# Patient Record
Sex: Female | Born: 1965 | Race: Black or African American | Hispanic: No | Marital: Single | Smoking: Current every day smoker
Health system: Southern US, Community
[De-identification: ages and names within clinical notes are randomized; demographics above are authoritative.]

## PROBLEM LIST (undated history)

## (undated) DIAGNOSIS — E66813 Obesity, class 3: Secondary | ICD-10-CM

## (undated) DIAGNOSIS — G4730 Sleep apnea, unspecified: Secondary | ICD-10-CM

## (undated) DIAGNOSIS — E119 Type 2 diabetes mellitus without complications: Secondary | ICD-10-CM

## (undated) DIAGNOSIS — I10 Essential (primary) hypertension: Secondary | ICD-10-CM

## (undated) DIAGNOSIS — K219 Gastro-esophageal reflux disease without esophagitis: Secondary | ICD-10-CM

## (undated) HISTORY — PX: OTHER SURGICAL HISTORY: SHX169

## (undated) HISTORY — DX: Obesity, class 3: E66.813

## (undated) HISTORY — DX: Morbid (severe) obesity due to excess calories: E66.01

---

## 1997-04-16 HISTORY — PX: TUBAL LIGATION: SHX77

## 1997-06-13 ENCOUNTER — Ambulatory Visit (HOSPITAL_COMMUNITY): Admission: RE | Admit: 1997-06-13 | Discharge: 1997-06-13 | Payer: Self-pay | Admitting: Obstetrics

## 1997-08-25 ENCOUNTER — Ambulatory Visit (HOSPITAL_COMMUNITY): Admission: RE | Admit: 1997-08-25 | Discharge: 1997-08-25 | Payer: Self-pay | Admitting: Obstetrics

## 1997-08-25 ENCOUNTER — Other Ambulatory Visit: Admission: RE | Admit: 1997-08-25 | Discharge: 1997-08-25 | Payer: Self-pay | Admitting: Obstetrics

## 1997-09-27 ENCOUNTER — Inpatient Hospital Stay (HOSPITAL_COMMUNITY): Admission: AD | Admit: 1997-09-27 | Discharge: 1997-09-29 | Payer: Self-pay | Admitting: Obstetrics

## 1997-10-28 ENCOUNTER — Ambulatory Visit (HOSPITAL_COMMUNITY): Admission: RE | Admit: 1997-10-28 | Discharge: 1997-10-28 | Payer: Self-pay | Admitting: Obstetrics

## 2000-12-11 ENCOUNTER — Emergency Department (HOSPITAL_COMMUNITY): Admission: EM | Admit: 2000-12-11 | Discharge: 2000-12-11 | Payer: Self-pay | Admitting: Emergency Medicine

## 2000-12-19 ENCOUNTER — Encounter: Admission: RE | Admit: 2000-12-19 | Discharge: 2000-12-19 | Payer: Self-pay | Admitting: Obstetrics

## 2002-03-14 ENCOUNTER — Emergency Department (HOSPITAL_COMMUNITY): Admission: EM | Admit: 2002-03-14 | Discharge: 2002-03-14 | Payer: Self-pay | Admitting: Emergency Medicine

## 2002-04-03 ENCOUNTER — Ambulatory Visit (HOSPITAL_BASED_OUTPATIENT_CLINIC_OR_DEPARTMENT_OTHER): Admission: RE | Admit: 2002-04-03 | Discharge: 2002-04-03 | Payer: Self-pay | Admitting: General Surgery

## 2003-04-11 ENCOUNTER — Emergency Department (HOSPITAL_COMMUNITY): Admission: EM | Admit: 2003-04-11 | Discharge: 2003-04-11 | Payer: Self-pay | Admitting: Emergency Medicine

## 2003-09-12 ENCOUNTER — Emergency Department (HOSPITAL_COMMUNITY): Admission: EM | Admit: 2003-09-12 | Discharge: 2003-09-12 | Payer: Self-pay | Admitting: Emergency Medicine

## 2003-11-04 ENCOUNTER — Ambulatory Visit (HOSPITAL_COMMUNITY): Admission: RE | Admit: 2003-11-04 | Discharge: 2003-11-04 | Payer: Self-pay | Admitting: Obstetrics

## 2005-08-22 ENCOUNTER — Emergency Department (HOSPITAL_COMMUNITY): Admission: EM | Admit: 2005-08-22 | Discharge: 2005-08-22 | Payer: Self-pay | Admitting: *Deleted

## 2006-11-18 ENCOUNTER — Emergency Department (HOSPITAL_COMMUNITY): Admission: EM | Admit: 2006-11-18 | Discharge: 2006-11-18 | Payer: Self-pay | Admitting: Emergency Medicine

## 2008-02-07 ENCOUNTER — Emergency Department (HOSPITAL_COMMUNITY): Admission: EM | Admit: 2008-02-07 | Discharge: 2008-02-07 | Payer: Self-pay | Admitting: Emergency Medicine

## 2009-06-02 ENCOUNTER — Emergency Department (HOSPITAL_COMMUNITY): Admission: EM | Admit: 2009-06-02 | Discharge: 2009-06-02 | Payer: Self-pay | Admitting: Emergency Medicine

## 2010-08-12 ENCOUNTER — Emergency Department (HOSPITAL_COMMUNITY): Payer: Medicaid Other

## 2010-08-12 ENCOUNTER — Emergency Department (HOSPITAL_COMMUNITY)
Admission: EM | Admit: 2010-08-12 | Discharge: 2010-08-12 | Disposition: A | Discharge status: Home / Self Care | Payer: Medicaid Other | Attending: Emergency Medicine | Admitting: Emergency Medicine

## 2010-08-12 DIAGNOSIS — F17200 Nicotine dependence, unspecified, uncomplicated: Secondary | ICD-10-CM | POA: Insufficient documentation

## 2010-08-12 DIAGNOSIS — M79609 Pain in unspecified limb: Secondary | ICD-10-CM

## 2010-08-12 DIAGNOSIS — M7989 Other specified soft tissue disorders: Secondary | ICD-10-CM | POA: Insufficient documentation

## 2010-08-12 DIAGNOSIS — I10 Essential (primary) hypertension: Secondary | ICD-10-CM | POA: Insufficient documentation

## 2010-08-15 ENCOUNTER — Emergency Department (HOSPITAL_COMMUNITY)
Admission: EM | Admit: 2010-08-15 | Discharge: 2010-08-15 | Disposition: A | Discharge status: Home / Self Care | Payer: Self-pay | Attending: Emergency Medicine | Admitting: Emergency Medicine

## 2010-08-15 DIAGNOSIS — F17200 Nicotine dependence, unspecified, uncomplicated: Secondary | ICD-10-CM | POA: Insufficient documentation

## 2010-08-15 DIAGNOSIS — M79609 Pain in unspecified limb: Secondary | ICD-10-CM | POA: Insufficient documentation

## 2010-08-15 DIAGNOSIS — I10 Essential (primary) hypertension: Secondary | ICD-10-CM | POA: Insufficient documentation

## 2010-09-01 NOTE — Op Note (Signed)
   Melissa Valdez, Melissa Valdez                         ACCOUNT NO.:  [id number]   MEDICAL RECORD NO.:  [id number]                   PATIENT TYPE:  AMB   LOCATION:  DSC                                  FACILITY:  [facility]   PHYSICIAN:  Ollen Gross. Vernell Morgans, M.D.              DATE OF BIRTH:  [birth year]   DATE OF PROCEDURE:  04/03/2002  DATE OF DISCHARGE:  04/03/2002                                 OPERATIVE REPORT   PREOPERATIVE DIAGNOSIS:  A 1 cm mass of the left forearm and a 3 cm mass of  the right upper inner thigh.   POSTOPERATIVE DIAGNOSIS:  A 1 cm mass of the left forearm and a 3 cm mass of  the right upper inner thigh.   OPERATION:  Excision of mass from the left arm and right thigh.   SURGEON:  Ollen Gross. Carolynne Edouard, M.D.   ANESTHESIA:  Local and IV sedation.   DESCRIPTION OF PROCEDURE:  After informed consent was obtained, the patient  was brought to the operating room and placed in the supine position on the  operating table.  After adequate sedation was achieved, the patient's left  arm and right upper thigh were prepped with Betadine and draped in the usual  sterile manner.  Attention was initially focused on the left arm.  This area  was infiltrated with 0.25% Marcaine with epinephrine.  A small elliptical  incision was made around the mass in question on the arm and excised sharply  with a 15 blade knife. The incision was then closed with a running 4-0  Monocryl subcuticular stitch.  Benzoin, Steri-Strips and sterile dressings  were applied.  Next, attention was turned to the right thigh.  Again the  base of this mass, which appeared to be fairly pedunculated, was infiltrated  with 0.25% Marcaine with epinephrine.  The base of the mass was excised in  an elliptical fashion down to the subcutaneous fat without difficulty.  Hemostasis was then achieved using Bovie electrocautery.  The incision was  closed using an interrupted 4-0 Monocryl subcuticular stitches.  Dermabond  dressing was  then applied and sterile dressings were applied when the  Dermabond was dry.  The patient tolerated the procedure well.  At the end of  the case, all needle, sponge and instrument counts were correct.  The  patient was then taken to the recovery room in stable condition.  Both  masses were sent to pathology for further evaluation.                                               Ollen Gross. Vernell Morgans, M.D.    PST/MEDQ  D:  04/07/2002  T:  04/07/2002  Job:  [id number]

## 2011-01-29 LAB — WET PREP, GENITAL
Trich, Wet Prep: NONE SEEN
WBC, Wet Prep HPF POC: NONE SEEN
Yeast Wet Prep HPF POC: NONE SEEN

## 2011-01-29 LAB — URINALYSIS, ROUTINE W REFLEX MICROSCOPIC
Bilirubin Urine: NEGATIVE
Glucose, UA: NEGATIVE
Protein, ur: NEGATIVE
Urobilinogen, UA: 0.2

## 2011-01-29 LAB — RPR: RPR Ser Ql: NONREACTIVE

## 2011-01-29 LAB — GC/CHLAMYDIA PROBE AMP, GENITAL: Chlamydia, DNA Probe: NEGATIVE

## 2011-01-29 LAB — URINE MICROSCOPIC-ADD ON

## 2011-04-26 ENCOUNTER — Emergency Department (HOSPITAL_COMMUNITY)
Admission: EM | Admit: 2011-04-26 | Discharge: 2011-04-26 | Disposition: A | Discharge status: Home / Self Care | Payer: Self-pay | Attending: Emergency Medicine | Admitting: Emergency Medicine

## 2011-04-26 ENCOUNTER — Encounter (HOSPITAL_COMMUNITY): Payer: Self-pay

## 2011-04-26 DIAGNOSIS — J069 Acute upper respiratory infection, unspecified: Secondary | ICD-10-CM | POA: Insufficient documentation

## 2011-04-26 DIAGNOSIS — R220 Localized swelling, mass and lump, head: Secondary | ICD-10-CM | POA: Insufficient documentation

## 2011-04-26 DIAGNOSIS — R059 Cough, unspecified: Secondary | ICD-10-CM | POA: Insufficient documentation

## 2011-04-26 DIAGNOSIS — F17200 Nicotine dependence, unspecified, uncomplicated: Secondary | ICD-10-CM | POA: Insufficient documentation

## 2011-04-26 DIAGNOSIS — J3489 Other specified disorders of nose and nasal sinuses: Secondary | ICD-10-CM | POA: Insufficient documentation

## 2011-04-26 DIAGNOSIS — R05 Cough: Secondary | ICD-10-CM | POA: Insufficient documentation

## 2011-04-26 DIAGNOSIS — R070 Pain in throat: Secondary | ICD-10-CM | POA: Insufficient documentation

## 2011-04-26 HISTORY — DX: Essential (primary) hypertension: I10

## 2011-04-26 LAB — RAPID STREP SCREEN (MED CTR MEBANE ONLY): Streptococcus, Group A Screen (Direct): NEGATIVE

## 2011-04-26 MED ORDER — ALBUTEROL SULFATE HFA 108 (90 BASE) MCG/ACT IN AERS
2.0000 | INHALATION_SPRAY | RESPIRATORY_TRACT | Status: DC | PRN
  Administered 2011-04-26: 2 via RESPIRATORY_TRACT

## 2011-04-26 MED ORDER — ALBUTEROL SULFATE HFA 108 (90 BASE) MCG/ACT IN AERS
INHALATION_SPRAY | RESPIRATORY_TRACT | Status: AC
  Filled 2011-04-26: qty 6.7

## 2011-04-26 MED ORDER — AEROCHAMBER PLUS W/MASK MISC
Status: AC
  Filled 2011-04-26: qty 1

## 2011-04-26 MED ORDER — AEROCHAMBER PLUS W/MASK MISC
1.0000 | Freq: Once | Status: AC
  Administered 2011-04-26: 1

## 2011-04-26 NOTE — ED Provider Notes (Signed)
History     CSN: [id number]  Arrival date & time 04/26/11  4098   First MD Initiated Contact with Patient 04/26/11 1027      Chief Complaint  Patient presents with  . Sore Throat  . Cough  . URI    (Consider location/radiation/quality/duration/timing/severity/associated sxs/prior treatment) HPI Comments: Patient with two-day history of upper respiratory infection symptoms including ear fullness, sore throat, nasal congestion, runny nose, and cough. She denies fever, chills, myalgias, nausea, vomiting, or diarrhea. She's been taking ibuprofen at home for pain. No sick contacts.  Patient is a 46 y.o. female presenting with pharyngitis, cough, and URI. The history is provided by the patient and a relative.  Sore Throat This is a new problem. The current episode started yesterday. The problem occurs constantly. Associated symptoms include congestion, coughing and a sore throat. Pertinent negatives include no abdominal pain, anorexia, chest pain, chills, fever, headaches, myalgias, nausea, rash or vomiting. The symptoms are aggravated by swallowing. She has tried NSAIDs for the symptoms. The treatment provided mild relief.  Cough Associated symptoms include rhinorrhea and sore throat. Pertinent negatives include no chest pain, no chills, no headaches, no myalgias and no shortness of breath.  URI The primary symptoms include sore throat and cough. Primary symptoms do not include fever, headaches, abdominal pain, nausea, vomiting, myalgias or rash.  Symptoms associated with the illness include congestion and rhinorrhea. The illness is not associated with chills.    Past Medical History  Diagnosis Date  . Hypertension     History reviewed. No pertinent past surgical history.  History reviewed. No pertinent family history.  History  Substance Use Topics  . Smoking status: Current Everyday Smoker  . Smokeless tobacco: Not on file  . Alcohol Use: Yes    OB History    Grav Para Term  Preterm Abortions TAB SAB Ect Mult Living                  Review of Systems  Constitutional: Negative for fever and chills.  HENT: Positive for congestion, sore throat and rhinorrhea.   Eyes: Negative for discharge.  Respiratory: Positive for cough. Negative for shortness of breath.   Cardiovascular: Negative for chest pain.  Gastrointestinal: Negative for nausea, vomiting, abdominal pain, diarrhea, constipation and anorexia.  Genitourinary: Negative for dysuria.  Musculoskeletal: Negative for myalgias.  Skin: Negative for rash.  Neurological: Negative for headaches.  Psychiatric/Behavioral: Negative for confusion.    Allergies  Review of patient's allergies indicates no known allergies.  Home Medications   Current Outpatient Rx  Name Route Sig Dispense Refill  . HYDROCHLOROTHIAZIDE 12.5 MG PO CAPS Oral Take 12.5 mg by mouth daily.    . IBUPROFEN 200 MG PO TABS Oral Take 400 mg by mouth every 6 (six) hours as needed. As needed for pain.      BP 154/100  Pulse 84  Temp(Src) 98 F (36.7 C) (Oral)  Resp 20  SpO2 98%  LMP 04/25/2011  Physical Exam  Nursing note and vitals reviewed. Constitutional: She is oriented to person, place, and time. She appears well-developed and well-nourished.  HENT:  Head: Normocephalic and atraumatic.  Right Ear: Tympanic membrane, external ear and ear canal normal.  Left Ear: Tympanic membrane, external ear and ear canal normal.  Nose: Mucosal edema and rhinorrhea present.  Mouth/Throat: Uvula is midline and mucous membranes are normal. Posterior oropharyngeal erythema present. No oropharyngeal exudate or tonsillar abscesses.  Eyes: Conjunctivae are normal. Pupils are equal, round, and reactive to light.  Right eye exhibits no discharge. Left eye exhibits no discharge.  Neck: Normal range of motion. Neck supple.  Cardiovascular: Normal rate and regular rhythm.   No murmur heard. Pulmonary/Chest: Effort normal and breath sounds normal. No  respiratory distress. She has no wheezes.  Abdominal: Soft. There is no tenderness. There is no rebound and no guarding.  Musculoskeletal: Normal range of motion.  Neurological: She is alert and oriented to person, place, and time.  Skin: Skin is warm and dry. No rash noted.  Psychiatric: She has a normal mood and affect.    ED Course  Procedures (including critical care time)   Labs Reviewed  RAPID STREP SCREEN   No results found.   1. Viral upper respiratory tract infection     11:13 AM Patient seen and examined. Strep screen sent.  Patient informed of neg strep screen result. Patient counseled on supportive care for viral URI and s/s to return including worsening symptoms, persistent fever, persistent vomiting, or if they have any other concerns.  Urged to see PCP if symptoms persist for more than 3 days. Patient verbalizes understanding and agrees with plan.   Patient counseled on use of albuterol HFA.  Told to use 1-2 puffs q 4 hours as needed for SOB.     MDM  Patient with symptoms consistent with a viral syndrome.  Vitals are stable, no fever.  No signs of dehydration.  Lung exam normal, no signs of pneumonia.  Possible bronchospastic component given pt report of wheezing -- inhaler prescribed. Supportive therapy indicated with return if symptoms worsen.           Eustace Moore [place], [place] 04/26/11 1739

## 2011-04-26 NOTE — ED Notes (Signed)
Pt states that for the past couple of days she has been having a sore throat with congestion, cough, and uri s/s. Alert and oriented.

## 2011-04-27 NOTE — ED Provider Notes (Signed)
Medical screening examination/treatment/procedure(s) were performed by non-physician practitioner and as supervising physician I was immediately available for consultation/collaboration.  Brealyn Baril R. Declynn Lopresti, MD 04/27/11 1204 

## 2012-06-25 ENCOUNTER — Other Ambulatory Visit: Payer: Self-pay | Admitting: Internal Medicine

## 2012-06-26 ENCOUNTER — Other Ambulatory Visit: Payer: Self-pay | Admitting: Internal Medicine

## 2012-07-08 ENCOUNTER — Ambulatory Visit
Admission: RE | Admit: 2012-07-08 | Discharge: 2012-07-08 | Disposition: A | Discharge status: Home / Self Care | Payer: Medicaid Other | Source: Ambulatory Visit | Attending: Internal Medicine | Admitting: Internal Medicine

## 2012-07-08 DIAGNOSIS — N6310 Unspecified lump in the right breast, unspecified quadrant: Secondary | ICD-10-CM

## 2012-07-09 ENCOUNTER — Ambulatory Visit
Admission: RE | Admit: 2012-07-09 | Discharge: 2012-07-09 | Disposition: A | Discharge status: Home / Self Care | Payer: Medicaid Other | Source: Ambulatory Visit | Attending: Internal Medicine | Admitting: Internal Medicine

## 2012-07-09 DIAGNOSIS — N920 Excessive and frequent menstruation with regular cycle: Secondary | ICD-10-CM

## 2012-08-28 ENCOUNTER — Encounter (HOSPITAL_COMMUNITY): Payer: Self-pay | Admitting: Emergency Medicine

## 2012-08-28 ENCOUNTER — Emergency Department (HOSPITAL_COMMUNITY)
Admission: EM | Admit: 2012-08-28 | Discharge: 2012-08-28 | Disposition: A | Discharge status: Home / Self Care | Payer: Medicaid Other | Source: Home / Self Care | Attending: Emergency Medicine | Admitting: Emergency Medicine

## 2012-08-28 ENCOUNTER — Other Ambulatory Visit (HOSPITAL_COMMUNITY)
Admission: RE | Admit: 2012-08-28 | Discharge: 2012-08-28 | Disposition: A | Discharge status: Home / Self Care | Payer: Medicaid Other | Source: Ambulatory Visit | Attending: Emergency Medicine | Admitting: Emergency Medicine

## 2012-08-28 DIAGNOSIS — N760 Acute vaginitis: Secondary | ICD-10-CM | POA: Insufficient documentation

## 2012-08-28 DIAGNOSIS — Z113 Encounter for screening for infections with a predominantly sexual mode of transmission: Secondary | ICD-10-CM | POA: Insufficient documentation

## 2012-08-28 DIAGNOSIS — N390 Urinary tract infection, site not specified: Secondary | ICD-10-CM

## 2012-08-28 LAB — POCT URINALYSIS DIP (DEVICE)
Bilirubin Urine: NEGATIVE
Ketones, ur: NEGATIVE mg/dL
Protein, ur: 30 mg/dL — AB
Specific Gravity, Urine: >=1.03 (ref 1.005–1.030)
pH: 6 (ref 5.0–8.0)

## 2012-08-28 LAB — POCT PREGNANCY, URINE: Preg Test, Ur: NEGATIVE

## 2012-08-28 MED ORDER — CEPHALEXIN 500 MG PO CAPS: 500.0000 mg | ORAL_CAPSULE | Freq: Three times a day (TID) | ORAL | Status: DC

## 2012-08-28 MED ORDER — FLUCONAZOLE 150 MG PO TABS: 150.0000 mg | ORAL_TABLET | Freq: Once | ORAL | Status: DC

## 2012-08-28 MED ORDER — PHENAZOPYRIDINE HCL 200 MG PO TABS: 200.0000 mg | ORAL_TABLET | Freq: Three times a day (TID) | ORAL | Status: DC | PRN

## 2012-08-28 MED ORDER — METRONIDAZOLE 500 MG PO TABS: 500.0000 mg | ORAL_TABLET | Freq: Two times a day (BID) | ORAL | Status: DC

## 2012-08-28 NOTE — ED Provider Notes (Signed)
Chief Complaint:   Chief Complaint  Patient presents with  . Vaginal Pain    History of Present Illness:   Melissa Valdez is a 47 year old female who has had a two-day history of vaginal irritation and pain, stinging with urination, and slight left flank pain. She denies any vaginal odor or itching. She's had no fever, chills, nausea, vomiting, urinary frequency, urgency, or hematuria. She has a history of a yeast infection the past but no other GYN problems. Last menstrual period was May 7 through the 11th. She is sexually active and has a bilateral tubal ligation for birth control.  Review of Systems:  Other than noted above, the patient denies any of the following symptoms: Systemic:  No fever, chills, sweats, or weight loss. GI:  No abdominal pain, nausea, anorexia, vomiting, diarrhea, constipation, melena or hematochezia. GU:  No dysuria, frequency, urgency, hematuria, vaginal discharge, itching, or abnormal vaginal bleeding. Skin:  No rash or itching.  PMFSH:  Past medical history, family history, social history, meds, and allergies were reviewed.  She has hypertension and GERD and takes hydrochlorothiazide 25 mg a day, omeprazole, and vitamin D.  Physical Exam:   Vital signs:  BP 135/74  Pulse 80  Temp(Src) 97.9 F (36.6 C) (Oral)  Resp 20  SpO2 97%  LMP 08/20/2012 General:  Alert, oriented and in no distress. Lungs:  Breath sounds clear and equal bilaterally.  No wheezes, rales or rhonchi. Heart:  Regular rhythm.  No gallops or murmers. Abdomen:  Soft, flat and non-distended.  No organomegaly or mass.  No tenderness, guarding or rebound.  Bowel sounds normally active. Pelvic exam:  Normal external genitalia, no ulcerations. There is a moderate amount of malodorous, tan, frothy discharge. Cervix was otherwise normal. No pain on cervical motion. Uterus was normal in size and shape and nontender. No adnexal masses or tenderness. Skin:  Clear, warm and dry.  Labs:   Results for  orders placed during the hospital encounter of 08/28/12  POCT URINALYSIS DIP (DEVICE)      Result Value Range   Glucose, UA NEGATIVE  NEGATIVE mg/dL   Bilirubin Urine NEGATIVE  NEGATIVE   Ketones, ur NEGATIVE  NEGATIVE mg/dL   Specific Gravity, Urine >=1.030  1.005 - 1.030   Hgb urine dipstick SMALL (*) NEGATIVE   pH 6.0  5.0 - 8.0   Protein, ur 30 (*) NEGATIVE mg/dL   Urobilinogen, UA 0.2  0.0 - 1.0 mg/dL   Nitrite POSITIVE (*) NEGATIVE   Leukocytes, UA MODERATE (*) NEGATIVE  POCT PREGNANCY, URINE      Result Value Range   Preg Test, Ur NEGATIVE  NEGATIVE    DNA probes for gonorrhea, Chlamydia, Trichomonas, Gardnerella, Candida obtained. Results are pending at this time and we'll call patient back about any positive results. Also obtain urine culture and we'll call patient is a need to change her antibiotics.  Assessment:  The primary encounter diagnosis was Vaginitis. A diagnosis of UTI (lower urinary tract infection) was also pertinent to this visit.  The discharge has the appearance of Trichomonas. Awaiting results of DNA probes, we'll go ahead and treat expectantly. It also appears she has urinary tract infection as well.  Plan:   1.  The following meds were prescribed:   New Prescriptions   CEPHALEXIN (KEFLEX) 500 MG CAPSULE    Take 1 capsule (500 mg total) by mouth 3 (three) times daily.   FLUCONAZOLE (DIFLUCAN) 150 MG TABLET    Take 1 tablet (150 mg total)  by mouth once.   METRONIDAZOLE (FLAGYL) 500 MG TABLET    Take 1 tablet (500 mg total) by mouth 2 (two) times daily.   2.  The patient was instructed in symptomatic care and handouts were given. 3.  The patient was told to return if becoming worse in any way, if no better in 7 days, and given some red flag symptoms such as worsening pain, fever, or persistent vomiting that would indicate earlier return. 4.  Follow up here if no improvement in one week.    Reuben Likes, MD 08/28/12 [phone]

## 2012-08-28 NOTE — ED Notes (Addendum)
C/o vaginal pain onset last pain while taking a shower and soap got up into her vagina.  Hx. Yeast infections.  No discharge or odor or blisters.  C/o stinging with urination and feels like she has to urinate all the time.

## 2012-08-30 LAB — URINE CULTURE: Colony Count: >=100000

## 2012-08-31 NOTE — ED Notes (Signed)
GC/Chlamydia neg., Affirm: Gardnerella pos., Candida and Trich neg., Urine culture: >100,000 colonies E. Coli.  Pt. adequately treated with Flagyl and Keflex. Vassie Moselle 08/31/2012

## 2012-12-01 ENCOUNTER — Other Ambulatory Visit: Payer: Self-pay | Admitting: Internal Medicine

## 2012-12-01 DIAGNOSIS — N6310 Unspecified lump in the right breast, unspecified quadrant: Secondary | ICD-10-CM

## 2012-12-16 ENCOUNTER — Ambulatory Visit
Admission: RE | Admit: 2012-12-16 | Discharge: 2012-12-16 | Disposition: A | Discharge status: Home / Self Care | Payer: Medicaid Other | Source: Ambulatory Visit | Attending: Internal Medicine | Admitting: Internal Medicine

## 2012-12-16 DIAGNOSIS — N6310 Unspecified lump in the right breast, unspecified quadrant: Secondary | ICD-10-CM

## 2013-05-12 ENCOUNTER — Encounter (HOSPITAL_COMMUNITY): Payer: Self-pay | Admitting: Emergency Medicine

## 2013-05-12 ENCOUNTER — Emergency Department (INDEPENDENT_AMBULATORY_CARE_PROVIDER_SITE_OTHER)
Admission: EM | Admit: 2013-05-12 | Discharge: 2013-05-12 | Disposition: A | Discharge status: Home / Self Care | Payer: Self-pay | Source: Home / Self Care | Attending: Family Medicine | Admitting: Family Medicine

## 2013-05-12 DIAGNOSIS — H5710 Ocular pain, unspecified eye: Secondary | ICD-10-CM

## 2013-05-12 DIAGNOSIS — H5712 Ocular pain, left eye: Secondary | ICD-10-CM

## 2013-05-12 DIAGNOSIS — H547 Unspecified visual loss: Secondary | ICD-10-CM

## 2013-05-12 MED ORDER — TETRACAINE HCL 0.5 % OP SOLN
OPHTHALMIC | Status: AC
  Filled 2013-05-12: qty 2

## 2013-05-12 NOTE — ED Provider Notes (Signed)
CSN: [id number]     Arrival date & time 05/12/13  0962 History   None    Chief Complaint  Patient presents with  . Eye Pain   (Consider location/radiation/quality/duration/timing/severity/associated sxs/prior Treatment) HPI Comments: 48 year old female presents complaining of an extremely painful, red, tearing left eye since she woke up this morning. She has never had this problem before. She also notes blurry vision in the affected eye. She rates the pain as 10 out of 10. She is not having any other symptoms at this time and denies any URI type symptoms. She does not think she has scratched her eye, and she does not recall any other injury to the eye  Patient is a 48 y.o. female presenting with eye pain.  Eye Pain Pertinent negatives include no chest pain, no abdominal pain and no shortness of breath.    Past Medical History  Diagnosis Date  . Hypertension    Past Surgical History  Procedure Laterality Date  . Tubal ligation  1999   Family History  Problem Relation Age of Onset  . Hypertension Mother   . Diabetes Father    History  Substance Use Topics  . Smoking status: Current Every Day Smoker -- 0.25 packs/day    Types: Cigarettes  . Smokeless tobacco: Not on file  . Alcohol Use: 12.6 oz/week    14 Shots of liquor, 7 Cans of beer per week   OB History   Grav Para Term Preterm Abortions TAB SAB Ect Mult Living                 Review of Systems  Constitutional: Negative for fever and chills.  Eyes: Positive for pain. Negative for visual disturbance.  Respiratory: Negative for cough and shortness of breath.   Cardiovascular: Negative for chest pain, palpitations and leg swelling.  Gastrointestinal: Negative for nausea, vomiting and abdominal pain.  Endocrine: Negative for polydipsia and polyuria.  Genitourinary: Negative for dysuria, urgency and frequency.  Musculoskeletal: Negative for arthralgias and myalgias.  Skin: Negative for rash.  Neurological: Negative for  dizziness, weakness and light-headedness.    Allergies  Review of patient's allergies indicates no known allergies.  Home Medications   Current Outpatient Rx  Name  Route  Sig  Dispense  Refill  . cephALEXin (KEFLEX) 500 MG capsule   Oral   Take 1 capsule (500 mg total) by mouth 3 (three) times daily.   30 capsule   0   . ergocalciferol (VITAMIN D2) 50000 UNITS capsule   Oral   Take 50,000 Units by mouth once a week.         . fluconazole (DIFLUCAN) 150 MG tablet   Oral   Take 1 tablet (150 mg total) by mouth once.   1 tablet   0   . hydrochlorothiazide (MICROZIDE) 12.5 MG capsule   Oral   Take 25 mg by mouth daily.          Marland Kitchen ibuprofen (ADVIL,MOTRIN) 200 MG tablet   Oral   Take 400 mg by mouth every 6 (six) hours as needed. As needed for pain.         . metroNIDAZOLE (FLAGYL) 500 MG tablet   Oral   Take 1 tablet (500 mg total) by mouth 2 (two) times daily.   14 tablet   0   . omeprazole (PRILOSEC) 20 MG capsule   Oral   Take 20 mg by mouth daily.         . phenazopyridine (PYRIDIUM) 200  MG tablet   Oral   Take 1 tablet (200 mg total) by mouth 3 (three) times daily as needed for pain.   15 tablet   0    BP 164/109  Pulse 77  Temp(Src) 97.6 F (36.4 C) (Oral)  Resp 20  SpO2 95%  LMP 04/30/2013 Physical Exam  Nursing note and vitals reviewed. Constitutional: She is oriented to person, place, and time. Vital signs are normal. She appears well-developed and well-nourished. No distress.  HENT:  Head: Normocephalic and atraumatic.  Eyes: EOM are normal. Pupils are equal, round, and reactive to light. Left eye exhibits discharge (watery). Left eye exhibits no exudate. Left conjunctiva is injected.  Fundoscopic exam:      The left eye shows red reflex.  Slit lamp exam:      The left eye shows corneal abrasion and fluorescein uptake (Abrasion versus dendritic pattern in the central left cornea).  Corrected Visual acuity is 20/30 in the right eye,  20/100 in the left eye. Intraocular pressure in the left is 19  Pulmonary/Chest: Effort normal. No respiratory distress.  Neurological: She is alert and oriented to person, place, and time. She has normal strength. Coordination normal.  Skin: Skin is warm and dry. No rash noted. She is not diaphoretic.  Psychiatric: She has a normal mood and affect. Judgment normal.    ED Course  Procedures (including critical care time) Labs Review Labs Reviewed - No data to display Imaging Review No results found.    MDM   1. Left eye pain   2. Decreased vision    Spoke with the on-call ophthalmology office, Dr. Ellie Lunch, they will see this patient now to rule out HSV keratitis  Liam Graham, PA-C 05/12/13 [phone]

## 2013-05-12 NOTE — ED Notes (Signed)
C/o waking with  Soreness/scratchy left eye with mild drainage.  Denies injury.   No  otc meds tried.

## 2013-05-13 NOTE — ED Provider Notes (Signed)
Medical screening examination/treatment/procedure(s) were performed by a resident physician or non-physician practitioner and as the supervising physician I was immediately available for consultation/collaboration.  Lynne Leader, MD    Gregor Hams, MD 05/13/13 [phone]

## 2013-10-26 ENCOUNTER — Other Ambulatory Visit: Payer: Self-pay | Admitting: Internal Medicine

## 2013-10-26 DIAGNOSIS — N649 Disorder of breast, unspecified: Secondary | ICD-10-CM

## 2013-12-17 ENCOUNTER — Ambulatory Visit
Admission: RE | Admit: 2013-12-17 | Discharge: 2013-12-17 | Disposition: A | Discharge status: Home / Self Care | Payer: Medicaid Other | Source: Ambulatory Visit | Attending: Internal Medicine | Admitting: Internal Medicine

## 2013-12-17 ENCOUNTER — Encounter (INDEPENDENT_AMBULATORY_CARE_PROVIDER_SITE_OTHER): Payer: Self-pay

## 2013-12-17 DIAGNOSIS — N649 Disorder of breast, unspecified: Secondary | ICD-10-CM

## 2014-01-19 ENCOUNTER — Encounter: Payer: Self-pay | Admitting: Obstetrics

## 2014-01-19 ENCOUNTER — Ambulatory Visit (INDEPENDENT_AMBULATORY_CARE_PROVIDER_SITE_OTHER): Payer: Medicaid Other | Admitting: Obstetrics

## 2014-01-19 ENCOUNTER — Other Ambulatory Visit: Payer: Self-pay | Admitting: Obstetrics

## 2014-01-19 VITALS — BP 140/98 | HR 76 | Temp 97.4°F | Ht 60.0 in | Wt 228.0 lb

## 2014-01-19 DIAGNOSIS — D251 Intramural leiomyoma of uterus: Secondary | ICD-10-CM

## 2014-01-19 DIAGNOSIS — Z0000 Encounter for general adult medical examination without abnormal findings: Secondary | ICD-10-CM

## 2014-01-19 DIAGNOSIS — N939 Abnormal uterine and vaginal bleeding, unspecified: Secondary | ICD-10-CM

## 2014-01-19 DIAGNOSIS — N946 Dysmenorrhea, unspecified: Secondary | ICD-10-CM

## 2014-01-19 LAB — CBC WITH DIFFERENTIAL/PLATELET
BASOS ABS: 0 10*3/uL (ref 0.0–0.1)
BASOS PCT: 0 % (ref 0–1)
Eosinophils Absolute: 0.1 10*3/uL (ref 0.0–0.7)
Eosinophils Relative: 1 % (ref 0–5)
HCT: 38.9 % (ref 36.0–46.0)
Hemoglobin: 12.5 g/dL (ref 12.0–15.0)
Lymphocytes Relative: 31 % (ref 12–46)
Lymphs Abs: 3.6 10*3/uL (ref 0.7–4.0)
MCH: 25.5 pg — ABNORMAL LOW (ref 26.0–34.0)
MCHC: 32.1 g/dL (ref 30.0–36.0)
MCV: 79.4 fL (ref 78.0–100.0)
Monocytes Absolute: 0.7 10*3/uL (ref 0.1–1.0)
Monocytes Relative: 6 % (ref 3–12)
NEUTROS ABS: 7.1 10*3/uL (ref 1.7–7.7)
NEUTROS PCT: 62 % (ref 43–77)
PLATELETS: 383 10*3/uL (ref 150–400)
RBC: 4.9 MIL/uL (ref 3.87–5.11)
RDW: 14.8 % (ref 11.5–15.5)
WBC: 11.5 10*3/uL — ABNORMAL HIGH (ref 4.0–10.5)

## 2014-01-20 ENCOUNTER — Ambulatory Visit (HOSPITAL_BASED_OUTPATIENT_CLINIC_OR_DEPARTMENT_OTHER): Payer: Medicaid Other | Attending: Internal Medicine | Admitting: Radiology

## 2014-01-20 ENCOUNTER — Encounter: Payer: Self-pay | Admitting: Obstetrics

## 2014-01-20 VITALS — Ht 60.0 in | Wt 230.0 lb

## 2014-01-20 DIAGNOSIS — R0683 Snoring: Secondary | ICD-10-CM | POA: Insufficient documentation

## 2014-01-20 DIAGNOSIS — N946 Dysmenorrhea, unspecified: Secondary | ICD-10-CM | POA: Insufficient documentation

## 2014-01-20 DIAGNOSIS — G4730 Sleep apnea, unspecified: Secondary | ICD-10-CM | POA: Diagnosis present

## 2014-01-20 DIAGNOSIS — N939 Abnormal uterine and vaginal bleeding, unspecified: Secondary | ICD-10-CM | POA: Insufficient documentation

## 2014-01-20 DIAGNOSIS — G4733 Obstructive sleep apnea (adult) (pediatric): Secondary | ICD-10-CM | POA: Insufficient documentation

## 2014-01-20 LAB — COMPREHENSIVE METABOLIC PANEL
ALBUMIN: 4.1 g/dL (ref 3.5–5.2)
ALK PHOS: 77 U/L (ref 39–117)
ALT: 22 U/L (ref 0–35)
AST: 21 U/L (ref 0–37)
BILIRUBIN TOTAL: 0.3 mg/dL (ref 0.2–1.2)
BUN: 13 mg/dL (ref 6–23)
CO2: 26 mEq/L (ref 19–32)
Calcium: 9.1 mg/dL (ref 8.4–10.5)
Chloride: 95 mEq/L — ABNORMAL LOW (ref 96–112)
Creat: 0.63 mg/dL (ref 0.50–1.10)
Glucose, Bld: 205 mg/dL — ABNORMAL HIGH (ref 70–99)
POTASSIUM: 3.5 meq/L (ref 3.5–5.3)
SODIUM: 133 meq/L — AB (ref 135–145)
TOTAL PROTEIN: 6.8 g/dL (ref 6.0–8.3)

## 2014-01-20 LAB — WET PREP BY MOLECULAR PROBE
CANDIDA SPECIES: NEGATIVE
GARDNERELLA VAGINALIS: POSITIVE — AB
TRICHOMONAS VAG: NEGATIVE

## 2014-01-20 LAB — TSH: TSH: 1.055 u[IU]/mL (ref 0.350–4.500)

## 2014-01-20 LAB — HIV ANTIBODY (ROUTINE TESTING W REFLEX): HIV 1&2 Ab, 4th Generation: NONREACTIVE

## 2014-01-20 LAB — PAP IG AND HPV HIGH-RISK: HPV DNA HIGH RISK: NOT DETECTED

## 2014-01-20 NOTE — Progress Notes (Signed)
Subjective:     Melissa Valdez is a 48 y.o. female here for a routine exam.  Current complaints: Heavy and painful periods..    Personal health questionnaire:  Is patient Ashkenazi Jewish, have a family history of breast and/or ovarian cancer: no Is there a family history of uterine cancer diagnosed at age < 84, gastrointestinal cancer, urinary tract cancer, family member who is a Field seismologist syndrome-associated carrier: no Is the patient overweight and hypertensive, family history of diabetes, personal history of gestational diabetes or PCOS: no Is patient over 55, have PCOS,  family history of premature CHD under age 70, diabetes, smoke, have hypertension or peripheral artery disease:  no At any time, has a partner hit, kicked or otherwise hurt or frightened you?: no Over the past 2 weeks, have you felt down, depressed or hopeless?: no Over the past 2 weeks, have you felt little interest or pleasure in doing things?:no   Gynecologic History Patient's last menstrual period was 12/29/2013. Contraception: tubal ligation Last Pap: 2014. Results were: normal Last mammogram: 2015. Results were: normal  Obstetric History OB History  Gravida Para Term Preterm AB SAB TAB Ectopic Multiple Living  2 2 2       2     # Outcome Date GA Lbr Len/2nd Weight Sex Delivery Anes PTL Lv  2 TRM      SVD   Y  1 TRM      SVD   Y      Past Medical History  Diagnosis Date  . Hypertension     Past Surgical History  Procedure Laterality Date  . Tubal ligation  1999    Current outpatient prescriptions:diclofenac (VOLTAREN) 50 MG EC tablet, Take 50 mg by mouth 2 (two) times daily., Disp: , Rfl: ;  hydrochlorothiazide (MICROZIDE) 12.5 MG capsule, Take 25 mg by mouth daily. , Disp: , Rfl: ;  ibuprofen (ADVIL,MOTRIN) 200 MG tablet, Take 400 mg by mouth every 6 (six) hours as needed. As needed for pain., Disp: , Rfl:  metFORMIN (GLUCOPHAGE) 500 MG tablet, Take 500 mg by mouth 2 (two) times daily with a meal., Disp:  , Rfl: ;  omeprazole (PRILOSEC) 20 MG capsule, Take 20 mg by mouth daily., Disp: , Rfl:  No Known Allergies  History  Substance Use Topics  . Smoking status: Former Smoker -- 0.25 packs/day    Types: Cigarettes    Quit date: 12/29/2013  . Smokeless tobacco: Not on file  . Alcohol Use: 8.4 oz/week    7 Shots of liquor, 7 Cans of beer per week    Family History  Problem Relation Age of Onset  . Hypertension Mother   . Diabetes Father       Review of Systems  Constitutional: negative for fatigue and weight loss Respiratory: negative for cough and wheezing Cardiovascular: negative for chest pain, fatigue and palpitations Gastrointestinal: negative for abdominal pain and change in bowel habits Musculoskeletal:negative for myalgias Neurological: negative for gait problems and tremors Behavioral/Psych: negative for abusive relationship, depression Endocrine: negative for temperature intolerance   Genitourinary:negative for abnormal menstrual periods, genital lesions, hot flashes, sexual problems and vaginal discharge Integument/breast: negative for breast lump, breast tenderness, nipple discharge and skin lesion(s)    Objective:       BP 140/98  Pulse 76  Temp(Src) 97.4 F (36.3 C)  Ht 5' (1.524 m)  Wt 228 lb (103.42 kg)  BMI 44.53 kg/m2  LMP 12/29/2013 General:   alert  Skin:   no rash or  abnormalities  Lungs:   clear to auscultation bilaterally  Heart:   regular rate and rhythm, S1, S2 normal, no murmur, click, rub or gallop  Breasts:   normal without suspicious masses, skin or nipple changes or axillary nodes  Abdomen:  normal findings: no organomegaly, soft, non-tender and no hernia  Pelvis:  External genitalia: normal general appearance Urinary system: urethral meatus normal and bladder without fullness, nontender Vaginal: normal without tenderness, induration or masses Cervix: normal appearance Adnexa: normal bimanual exam Uterus: anteverted and non-tender, normal  size   Lab Review Urine pregnancy test Labs reviewed yes Radiologic studies reviewed yes    Assessment:    Healthy female exam.   Uterine Fibroids  AUB  Dysmenorrhea     Plan:   Ultrasound ordered.  Education reviewed: low fat, low cholesterol diet, self breast exams and weight bearing exercise. Follow up in: 2 weeks. Patient requests a hysterectomy.  Will advise patient after ultrasound.   Meds ordered this encounter  Medications  . metFORMIN (GLUCOPHAGE) 500 MG tablet    Sig: Take 500 mg by mouth 2 (two) times daily with a meal.  . diclofenac (VOLTAREN) 50 MG EC tablet    Sig: Take 50 mg by mouth 2 (two) times daily.   Orders Placed This Encounter  Procedures  . WET PREP BY MOLECULAR PROBE  . GC/Chlamydia Probe Amp  . US Pelvis Complete    Standing Status: Future     Number of Occurrences:      Standing Expiration Date: 03/22/2015    Order Specific Question:  Reason for Exam (SYMPTOM  OR DIAGNOSIS REQUIRED)    Answer:  Fibroids    Order Specific Question:  Preferred imaging location?    Answer:  [facility]  . US Transvaginal Non-OB    Standing Status: Future     Number of Occurrences:      Standing Expiration Date: 03/22/2015    Order Specific Question:  Reason for Exam (SYMPTOM  OR DIAGNOSIS REQUIRED)    Answer:  Fibroids    Order Specific Question:  Preferred imaging location?    Answer:  [facility]  . CBC with Differential  . Comprehensive metabolic panel  . TSH  . HIV antibody (with reflex)

## 2014-01-21 ENCOUNTER — Other Ambulatory Visit: Payer: Self-pay | Admitting: Obstetrics

## 2014-01-21 DIAGNOSIS — B9689 Other specified bacterial agents as the cause of diseases classified elsewhere: Secondary | ICD-10-CM

## 2014-01-21 DIAGNOSIS — N760 Acute vaginitis: Principal | ICD-10-CM

## 2014-01-21 LAB — GC/CHLAMYDIA PROBE AMP
CT Probe RNA: NEGATIVE
GC PROBE AMP APTIMA: NEGATIVE

## 2014-01-21 MED ORDER — METRONIDAZOLE 500 MG PO TABS: 500.0000 mg | ORAL_TABLET | Freq: Two times a day (BID) | ORAL | Status: DC

## 2014-01-27 DIAGNOSIS — G4733 Obstructive sleep apnea (adult) (pediatric): Secondary | ICD-10-CM | POA: Diagnosis not present

## 2014-01-27 NOTE — Sleep Study (Signed)
   NAME: Melissa Valdez DATE OF BIRTH:  [birth year] MEDICAL RECORD NUMBER [id number]  LOCATION: [facility]  PHYSICIAN: Dawnyel Leven D  DATE OF STUDY: 01/20/2014  SLEEP STUDY TYPE: Nocturnal Polysomnogram               REFERRING PHYSICIAN: Philis Fendt, MD  INDICATION FOR STUDY: Hypersomnia with sleep apnea  EPWORTH SLEEPINESS SCORE:   12/24 HEIGHT: 5' (152.4 cm)  WEIGHT: 230 lb (104.327 kg)    Body mass index is 44.92 kg/(m^2).  NECK SIZE: 15.5 in.  MEDICATIONS: Charted for review  SLEEP ARCHITECTURE: Split study protocol. During the diagnostic phase, total sleep time 167.5 minutes sleep efficiency 87.2%. Stage I was 1.8%, stage II 79.7%, stage III 11.6%, REM 6.9% of total sleep time. Sleep latency 15 minutes, REM latency 60 minutes, awake after sleep onset 6 minutes, arousal index 30.8, bedtime medication: None  RESPIRATORY DATA: Apnea hypopneas index (AHI) 45.9 per hour. 128 total events scored including 9 obstructive apneas, 1 mixed apnea, 118 hypopneas. All events were associated with nonsupine sleep position. REM AHI 114.8 per hour. CPAP was titrated to 19 CWP with inadequate control, residual AHI 34.3 per hour. She was then transferred to bilevel with inspiratory pressure of 23 and expiratory pressure of 19 CWP, AHI 1.6 per hour. She wore a small fullface mask.  OXYGEN DATA: Loud snoring with oxygen desaturation to a nadir of 62% on room air. With bilevel control, mean oxygen saturation was 92.1% on room air.  CARDIAC DATA: Sinus rhythm with PACs  MOVEMENT/PARASOMNIA: Bruxism was noted. No significant movement disturbance. Bathroom x1.  IMPRESSION/ RECOMMENDATION:   1) Severe obstructive sleep apnea/hypoxia syndrome, AHI 45.9 per hour with nonsupine events. Loud snoring with oxygen desaturation to a nadir of 62% on room air. 2) CPAP titration to 19 CWP did not give adequate control. She was then changed to bilevel with inspiratory pressure 23 CWP and  expiratory pressure 19 CWP, AHI 1.6 per hour. Snoring was prevented and mean oxygen saturation was 92.1% on room air. She wore a small [employer] Simplus  fullface mask with heated humidifier and an EPR of 2  Deneise Lever Diplomate, [employer] adviser of Sleep Medicine  ELECTRONICALLY SIGNED ON:  01/27/2014, 2:32 PM [place] PH: [phone]   FX: [phone] [place]

## 2014-01-29 ENCOUNTER — Ambulatory Visit (HOSPITAL_COMMUNITY)
Admission: RE | Admit: 2014-01-29 | Discharge: 2014-01-29 | Disposition: A | Discharge status: Home / Self Care | Payer: Medicaid Other | Source: Ambulatory Visit | Attending: Obstetrics | Admitting: Obstetrics

## 2014-01-29 DIAGNOSIS — N831 Corpus luteum cyst: Secondary | ICD-10-CM | POA: Diagnosis not present

## 2014-01-29 DIAGNOSIS — N920 Excessive and frequent menstruation with regular cycle: Secondary | ICD-10-CM | POA: Insufficient documentation

## 2014-01-29 DIAGNOSIS — N949 Unspecified condition associated with female genital organs and menstrual cycle: Secondary | ICD-10-CM | POA: Diagnosis not present

## 2014-01-29 DIAGNOSIS — D252 Subserosal leiomyoma of uterus: Secondary | ICD-10-CM | POA: Diagnosis not present

## 2014-01-29 DIAGNOSIS — D251 Intramural leiomyoma of uterus: Secondary | ICD-10-CM

## 2014-02-02 ENCOUNTER — Ambulatory Visit: Payer: Medicaid Other | Admitting: Obstetrics

## 2014-02-04 ENCOUNTER — Encounter: Payer: Self-pay | Admitting: Obstetrics

## 2014-02-04 ENCOUNTER — Ambulatory Visit (INDEPENDENT_AMBULATORY_CARE_PROVIDER_SITE_OTHER): Payer: Medicaid Other | Admitting: Obstetrics

## 2014-02-04 VITALS — BP 125/84 | HR 69 | Temp 97.8°F | Wt 224.0 lb

## 2014-02-04 DIAGNOSIS — D251 Intramural leiomyoma of uterus: Secondary | ICD-10-CM

## 2014-02-04 DIAGNOSIS — N939 Abnormal uterine and vaginal bleeding, unspecified: Secondary | ICD-10-CM

## 2014-02-04 DIAGNOSIS — N946 Dysmenorrhea, unspecified: Secondary | ICD-10-CM

## 2014-02-04 NOTE — Progress Notes (Signed)
Patient presents for ultrasound follow up and scheduling a consultation with Dr. Delsa Sale for a Robotic - assisted total hysterectomy.  A/P:  Symptomatic uterine fibroids.  Ultrasound results discussed and further consultation scheduled.  Baltazar Najjar MD

## 2014-02-08 ENCOUNTER — Other Ambulatory Visit: Payer: Self-pay

## 2014-02-08 ENCOUNTER — Encounter: Payer: Self-pay | Admitting: Obstetrics & Gynecology

## 2014-02-08 ENCOUNTER — Ambulatory Visit (INDEPENDENT_AMBULATORY_CARE_PROVIDER_SITE_OTHER): Payer: Medicaid Other | Admitting: Obstetrics & Gynecology

## 2014-02-08 VITALS — Temp 97.0°F | Ht 60.0 in | Wt 223.0 lb

## 2014-02-08 DIAGNOSIS — E138 Other specified diabetes mellitus with unspecified complications: Secondary | ICD-10-CM

## 2014-02-08 LAB — HEMOGLOBIN A1C
HEMOGLOBIN A1C: 10.3 % — AB (ref <5.7)
Mean Plasma Glucose: 249 mg/dL — ABNORMAL HIGH (ref <117)

## 2014-02-08 NOTE — Progress Notes (Signed)
Patient ID: Shenaya Lebo, female   DOB: 12-16-1965, 48 y.o.   MRN: [id number]  Chief Complaint  Patient presents with  . Advice Only    HPI Evellyn Tuff is a 48 y.o. female.  The patient carries a diagnosis of uterine fibroids with secondary abnormal uterine bleeding.  There is no demonstrable anemia.  HPI  Past Medical History  Diagnosis Date  . Hypertension     Past Surgical History  Procedure Laterality Date  . Tubal ligation  1999    Family History  Problem Relation Age of Onset  . Hypertension Mother   . Diabetes Father     Social History History  Substance Use Topics  . Smoking status: Former Smoker -- 0.25 packs/day    Types: Cigarettes    Quit date: 12/29/2013  . Smokeless tobacco: Not on file  . Alcohol Use: 1.8 oz/week    1 Shots of liquor, 2 Cans of beer per week    No Known Allergies  Current Outpatient Prescriptions  Medication Sig Dispense Refill  . diclofenac (VOLTAREN) 50 MG EC tablet Take 50 mg by mouth 2 (two) times daily.      . hydrochlorothiazide (MICROZIDE) 12.5 MG capsule Take 25 mg by mouth daily.       . metFORMIN (GLUCOPHAGE) 500 MG tablet Take 500 mg by mouth 2 (two) times daily with a meal.      . omeprazole (PRILOSEC) 20 MG capsule Take 20 mg by mouth daily.      Marland Kitchen ibuprofen (ADVIL,MOTRIN) 200 MG tablet Take 400 mg by mouth every 6 (six) hours as needed. As needed for pain.       No current facility-administered medications for this visit.    Review of Systems Review of Systems Constitutional: negative for fatigue and weight loss Respiratory: negative for cough and wheezing Cardiovascular: negative for chest pain, fatigue and palpitations Gastrointestinal: negative for abdominal pain and change in bowel habits Genitourinary: positive for abnormal uterine bleeding Integument/breast: negative for nipple discharge Musculoskeletal:negative for myalgias Neurological: negative for gait problems and tremors Behavioral/Psych: negative  for abusive relationship, depression Endocrine: negative for temperature intolerance     Temperature 97 F (36.1 C), height 5' (1.524 m), weight 101.152 kg (223 lb), last menstrual period 01/26/2014.  Physical Exam Physical Exam General:   alert  Skin:   no rash or abnormalities  Lungs:   clear to auscultation bilaterally  Heart:   regular rate and rhythm, S1, S2 normal, no murmur, click, rub or gallop  Breasts:   normal without suspicious masses, skin or nipple changes or axillary nodes  Abdomen:  normal findings: no organomegaly, soft, non-tender and no hernia  Pelvis:  External genitalia: normal general appearance Urinary system: urethral meatus normal and bladder without fullness, nontender Vaginal: normal without tenderness, induration or masses Cervix: normal appearance Adnexa: limited by body habitus Uterus: limited by body habitus      Data Reviewed U/S  Assessment   Moderately enlarged fibroid uterus with associated abnormal uterine bleeding Obese/OSA H/O AODM    Plan    Orders Placed This Encounter  Procedures  . WET PREP BY MOLECULAR PROBE  . Hemoglobin A1c   She elects to proceed with an attempted TRH/ B/L salpingectomies.  Risks/benefits reviewed--increased risk secondary to obesity.  Alternatives to hysterectomy were reviewed--Mirena IUD, endometrial ablation, [place] Preop clearance Optimize diabetes control  Endometrial Biopsy Procedure Note  Pre-operative Diagnosis: Abnormal uterine bleeding  Post-operative Diagnosis: same  Indications: abnormal uterine bleeding  Procedure Details  The risks (including infection, bleeding, pain, and uterine perforation) and benefits of the procedure were explained to the patient and Written informed consent was obtained.    The patient was placed in the dorsal lithotomy position.  Bimanual exam showed the uterus to be in the anteroflexed position.  A Graves' speculum inserted in the vagina, and the cervix prepped  with povidone iodine.  Endocervical curettage with a Kevorkian curette was not performed.   A sharp tenaculum was applied to the anterior lip of the cervix for stabilization.  A sterile uterine sound was used to sound the uterus to a depth of 9cm.  A Pipelle endometrial aspirator was used to sample the endometrium.  Sample was sent for pathologic examination.  Condition: Stable  Complications: None  Plan:  The patient was advised to call for any fever or for prolonged or severe pain or bleeding. She was advised to use OTC analgesics as needed for mild to moderate pain. She was advised to avoid vaginal intercourse for 48 hours or until the bleeding has completely stopped.         JACKSON-MOORE,Dalicia Kisner A 02/08/2014, 2:42 PM

## 2014-02-09 LAB — WET PREP BY MOLECULAR PROBE
Candida species: NEGATIVE
GARDNERELLA VAGINALIS: POSITIVE — AB
Trichomonas vaginosis: NEGATIVE

## 2014-02-09 NOTE — Patient Instructions (Signed)
Endometrial Biopsy, Care After Refer to this sheet in the next few weeks. These instructions provide you with information on caring for yourself after your procedure. Your health care provider may also give you more specific instructions. Your treatment has been planned according to current medical practices, but problems sometimes occur. Call your health care provider if you have any problems or questions after your procedure. WHAT TO EXPECT AFTER THE PROCEDURE After your procedure, it is typical to have the following:  You may have mild cramping and a small amount of vaginal bleeding for a few days after the procedure. This is normal. HOME CARE INSTRUCTIONS  Only take over-the-counter or prescription medicine as directed by your health care provider.  Do not douche, use tampons, or have sexual intercourse until your health care provider approves.  Follow your health care provider's instructions regarding any activity restrictions, such as strenuous exercise or heavy lifting. SEEK MEDICAL CARE IF:  You have heavy bleeding or bleeding longer than 2 days after the procedure.  You have bad smelling drainage from your vagina.  You have a fever and chills.  Youhave severe lower stomach (abdominal) pain. SEEK IMMEDIATE MEDICAL CARE IF:  You have severe cramps in your stomach or back.  You pass large blood clots.  Your bleeding increases.  You become weak or lightheaded, or you pass out. Document Released: 01/21/2013 Document Reviewed: 01/21/2013 ExitCare Patient Information 2015 [employer]. This information is not intended to replace advice given to you by your health care provider. Make sure you discuss any questions you have with your health care provider.  

## 2014-02-12 ENCOUNTER — Encounter: Payer: Self-pay | Admitting: *Deleted

## 2014-02-15 ENCOUNTER — Encounter: Payer: Self-pay | Admitting: Obstetrics & Gynecology

## 2014-02-16 ENCOUNTER — Other Ambulatory Visit: Payer: Self-pay | Admitting: *Deleted

## 2014-02-16 ENCOUNTER — Telehealth: Payer: Self-pay | Admitting: *Deleted

## 2014-02-16 NOTE — Telephone Encounter (Signed)
Spoke with patient. She would like medication for her cycle that is due around the 12th of this month. Patient has an appointment with her primary tomorrow and will speak to him regarding her A1C and her upcomming surgery. A preop clearance letter was sent to the provider.She is scheduled for her surgery 12/4.

## 2014-02-16 NOTE — Telephone Encounter (Signed)
Attempted to call patient regarding surgery and pre op clearance. Left message to call back.

## 2014-02-16 NOTE — Telephone Encounter (Signed)
-----   Message from Lahoma Crocker, MD sent at 02/09/2014  3:27 PM EDT ----- Call pt--diabetes in poor control.  Needs repeat Hgb A1c week of 11/23.  Encourage f/u w primary care provider to optimize glycemic control.  Can offer medical management of AUB in the meantime.

## 2014-02-19 ENCOUNTER — Other Ambulatory Visit: Payer: Self-pay | Admitting: *Deleted

## 2014-02-19 DIAGNOSIS — B9689 Other specified bacterial agents as the cause of diseases classified elsewhere: Secondary | ICD-10-CM

## 2014-02-19 DIAGNOSIS — N760 Acute vaginitis: Principal | ICD-10-CM

## 2014-02-19 MED ORDER — METRONIDAZOLE 500 MG PO TABS: 500.0000 mg | ORAL_TABLET | Freq: Two times a day (BID) | ORAL | Status: DC

## 2014-02-21 NOTE — Telephone Encounter (Signed)
Can call in rx for Micronor or NorQD

## 2014-02-24 ENCOUNTER — Other Ambulatory Visit: Payer: Self-pay | Admitting: *Deleted

## 2014-02-24 DIAGNOSIS — N939 Abnormal uterine and vaginal bleeding, unspecified: Secondary | ICD-10-CM

## 2014-02-24 MED ORDER — NORETHINDRONE 0.35 MG PO TABS: 1.0000 | ORAL_TABLET | Freq: Every day | ORAL | Status: DC

## 2014-02-24 NOTE — Telephone Encounter (Signed)
Rx sent to pharmacy- patient notified.

## 2014-03-03 NOTE — Patient Instructions (Addendum)
Melissa Valdez  03/03/2014    Your procedure is scheduled on 03/19/2014.               Come thru the [place] entrance and follow the Signs to the [place] at 0845am.    Call this number if you have problems the morning of surgery: [phone]   Remember:   Do not eat food or drink liquids after midnight.   Take these medicines the morning of surgery with A SIP OF WATER: prilosec    Do not wear jewelry, make-up or nail polish.  Do not wear lotions, powders, or perfumes.  deodorant.  Do not shave 48 hours prior to surgery. .  Do not bring valuables to the hospital.  Contacts, dentures or bridgework may not be worn into surgery.  Leave suitcase in the car. After surgery it may be brought to your room.  For patients admitted to the hospital, checkout time is 11:00 AM the day of  discharge.     Please read over the following fact sheets that you were given: , coughing and deep breathing exercises, leg exercises            [facility] - Preparing for Surgery Before surgery, you can play an important role.  Because skin is not sterile, your skin needs to be as free of germs as possible.  You can reduce the number of germs on your skin by washing with CHG (chlorahexidine gluconate) soap before surgery.  CHG is an antiseptic cleaner which kills germs and bonds with the skin to continue killing germs even after washing. Please DO NOT use if you have an allergy to CHG or antibacterial soaps.  If your skin becomes reddened/irritated stop using the CHG and inform your nurse when you arrive at Short Stay. Do not shave (including legs and underarms) for at least 48 hours prior to the first CHG shower.  You may shave your face/neck. Please follow these instructions carefully:  1.  Shower with CHG Soap the night before surgery and the  morning of Surgery.  2.  If you choose to wash your hair, wash your hair first as usual with your  normal  shampoo.  3.  After you shampoo, rinse your hair  and body thoroughly to remove the  shampoo.                           4.  Use CHG as you would any other liquid soap.  You can apply chg directly  to the skin and wash                       Gently with a scrungie or clean washcloth.  5.  Apply the CHG Soap to your body ONLY FROM THE NECK DOWN.   Do not use on face/ open                           Wound or open sores. Avoid contact with eyes, ears mouth and genitals (private parts).                       Wash face,  Genitals (private parts) with your normal soap.             6.  Wash thoroughly, paying special attention to the area where your surgery  will be performed.  7.  Thoroughly rinse your body with warm water from the neck down.  8.  DO NOT shower/wash with your normal soap after using and rinsing off  the CHG Soap.                9.  Pat yourself dry with a clean towel.            10.  Wear clean pajamas.            11.  Place clean sheets on your bed the night of your first shower and do not  sleep with pets. Day of Surgery : Do not apply any lotions/deodorants the morning of surgery.  Please wear clean clothes to the hospital/surgery center.  FAILURE TO FOLLOW THESE INSTRUCTIONS MAY RESULT IN THE CANCELLATION OF YOUR SURGERY PATIENT SIGNATURE_________________________________  NURSE SIGNATURE__________________________________  ________________________________________________________________________  WHAT IS A BLOOD TRANSFUSION? Blood Transfusion Information  A transfusion is the replacement of blood or some of its parts. Blood is made up of multiple cells which provide different functions.  Red blood cells carry oxygen and are used for blood loss replacement.  White blood cells fight against infection.  Platelets control bleeding.  Plasma helps clot blood.  Other blood products are available for specialized needs, such as hemophilia or other clotting disorders. BEFORE THE TRANSFUSION  Who gives blood for transfusions?    Healthy volunteers who are fully evaluated to make sure their blood is safe. This is blood bank blood. Transfusion therapy is the safest it has ever been in the practice of medicine. Before blood is taken from a donor, a complete history is taken to make sure that person has no history of diseases nor engages in risky social behavior (examples are intravenous drug use or sexual activity with multiple partners). The donor's travel history is screened to minimize risk of transmitting infections, such as malaria. The donated blood is tested for signs of infectious diseases, such as HIV and hepatitis. The blood is then tested to be sure it is compatible with you in order to minimize the chance of a transfusion reaction. If you or a relative donates blood, this is often done in anticipation of surgery and is not appropriate for emergency situations. It takes many days to process the donated blood. RISKS AND COMPLICATIONS Although transfusion therapy is very safe and saves many lives, the main dangers of transfusion include:   Getting an infectious disease.  Developing a transfusion reaction. This is an allergic reaction to something in the blood you were given. Every precaution is taken to prevent this. The decision to have a blood transfusion has been considered carefully by your caregiver before blood is given. Blood is not given unless the benefits outweigh the risks. AFTER THE TRANSFUSION  Right after receiving a blood transfusion, you will usually feel much better and more energetic. This is especially true if your red blood cells have gotten low (anemic). The transfusion raises the level of the red blood cells which carry oxygen, and this usually causes an energy increase.  The nurse administering the transfusion will monitor you carefully for complications. HOME CARE INSTRUCTIONS  No special instructions are needed after a transfusion. You may find your energy is better. Speak with your  caregiver about any limitations on activity for underlying diseases you may have. SEEK MEDICAL CARE IF:   Your condition is not improving after your transfusion.  You develop redness or irritation at the intravenous (IV) site. SEEK IMMEDIATE MEDICAL CARE IF:  Any of  the following symptoms occur over the next 12 hours:  Shaking chills.  You have a temperature by mouth above 102 F (38.9 C), not controlled by medicine.  Chest, back, or muscle pain.  People around you feel you are not acting correctly or are confused.  Shortness of breath or difficulty breathing.  Dizziness and fainting.  You get a rash or develop hives.  You have a decrease in urine output.  Your urine turns a dark color or changes to pink, red, or brown. Any of the following symptoms occur over the next 10 days:  You have a temperature by mouth above 102 F (38.9 C), not controlled by medicine.  Shortness of breath.  Weakness after normal activity.  The white part of the eye turns yellow (jaundice).  You have a decrease in the amount of urine or are urinating less often.  Your urine turns a dark color or changes to pink, red, or brown. Document Released: 03/30/2000 Document Revised: 06/25/2011 Document Reviewed: 11/17/2007 [facility] Patient Information 2014 [place], [place].  _______________________________________________________________________

## 2014-03-04 ENCOUNTER — Encounter (HOSPITAL_COMMUNITY)
Admission: RE | Admit: 2014-03-04 | Discharge: 2014-03-04 | Disposition: A | Discharge status: Home / Self Care | Payer: Medicaid Other | Source: Ambulatory Visit | Attending: Obstetrics & Gynecology | Admitting: Obstetrics & Gynecology

## 2014-03-04 ENCOUNTER — Encounter (HOSPITAL_COMMUNITY): Payer: Self-pay

## 2014-03-04 ENCOUNTER — Ambulatory Visit (HOSPITAL_COMMUNITY)
Admission: RE | Admit: 2014-03-04 | Discharge: 2014-03-04 | Disposition: A | Discharge status: Home / Self Care | Payer: Medicaid Other | Source: Ambulatory Visit | Attending: Anesthesiology | Admitting: Anesthesiology

## 2014-03-04 DIAGNOSIS — Z01818 Encounter for other preprocedural examination: Secondary | ICD-10-CM | POA: Diagnosis present

## 2014-03-04 DIAGNOSIS — Z87891 Personal history of nicotine dependence: Secondary | ICD-10-CM | POA: Insufficient documentation

## 2014-03-04 DIAGNOSIS — I10 Essential (primary) hypertension: Secondary | ICD-10-CM | POA: Insufficient documentation

## 2014-03-04 HISTORY — DX: Type 2 diabetes mellitus without complications: E11.9

## 2014-03-04 HISTORY — DX: Gastro-esophageal reflux disease without esophagitis: K21.9

## 2014-03-04 HISTORY — DX: Sleep apnea, unspecified: G47.30

## 2014-03-04 LAB — BASIC METABOLIC PANEL
Anion gap: 11 (ref 5–15)
BUN: 10 mg/dL (ref 6–23)
CALCIUM: 9.5 mg/dL (ref 8.4–10.5)
CHLORIDE: 95 meq/L — AB (ref 96–112)
CO2: 31 meq/L (ref 19–32)
CREATININE: 0.42 mg/dL — AB (ref 0.50–1.10)
GFR calc Af Amer: >90 mL/min (ref >90)
GFR calc non Af Amer: >90 mL/min (ref >90)
Glucose, Bld: 168 mg/dL — ABNORMAL HIGH (ref 70–99)
Potassium: 3.4 mEq/L — ABNORMAL LOW (ref 3.7–5.3)
Sodium: 137 mEq/L (ref 137–147)

## 2014-03-04 LAB — CBC
HEMATOCRIT: 36.7 % (ref 36.0–46.0)
Hemoglobin: 11.5 g/dL — ABNORMAL LOW (ref 12.0–15.0)
MCH: 25.2 pg — AB (ref 26.0–34.0)
MCHC: 31.3 g/dL (ref 30.0–36.0)
MCV: 80.5 fL (ref 78.0–100.0)
PLATELETS: 325 10*3/uL (ref 150–400)
RBC: 4.56 MIL/uL (ref 3.87–5.11)
RDW: 15 % (ref 11.5–15.5)
WBC: 9.2 10*3/uL (ref 4.0–10.5)

## 2014-03-04 LAB — HCG, SERUM, QUALITATIVE: Preg, Serum: NEGATIVE

## 2014-03-05 NOTE — Progress Notes (Signed)
Glucose is slightly elevated.  Otherwise OK

## 2014-03-08 ENCOUNTER — Other Ambulatory Visit: Payer: Medicaid Other

## 2014-03-08 DIAGNOSIS — Z01818 Encounter for other preprocedural examination: Secondary | ICD-10-CM

## 2014-03-08 LAB — HEMOGLOBIN A1C
Hgb A1c MFr Bld: 9.5 % — ABNORMAL HIGH (ref <5.7)
Mean Plasma Glucose: 226 mg/dL — ABNORMAL HIGH (ref <117)

## 2014-03-10 ENCOUNTER — Other Ambulatory Visit: Payer: Self-pay | Admitting: *Deleted

## 2014-03-10 ENCOUNTER — Encounter (HOSPITAL_COMMUNITY): Payer: Self-pay | Admitting: Anesthesiology

## 2014-03-10 ENCOUNTER — Telehealth: Payer: Self-pay

## 2014-03-10 DIAGNOSIS — E1165 Type 2 diabetes mellitus with hyperglycemia: Secondary | ICD-10-CM

## 2014-03-10 NOTE — Telephone Encounter (Signed)
patient is aware of appt with [facility] diabetic mgmt center on 11/30 at [facility]

## 2014-03-15 ENCOUNTER — Encounter: Payer: Medicaid Other | Attending: Internal Medicine | Admitting: *Deleted

## 2014-03-15 ENCOUNTER — Encounter: Payer: Self-pay | Admitting: *Deleted

## 2014-03-15 VITALS — Ht 60.0 in | Wt 228.1 lb

## 2014-03-15 DIAGNOSIS — Z713 Dietary counseling and surveillance: Secondary | ICD-10-CM | POA: Diagnosis not present

## 2014-03-15 DIAGNOSIS — E118 Type 2 diabetes mellitus with unspecified complications: Secondary | ICD-10-CM | POA: Diagnosis not present

## 2014-03-15 NOTE — Progress Notes (Signed)
Diabetes Self-Management Education  Visit Type:  Initial visit  Appt. Start Time: 0900 Appt. End Time: 1030  03/15/2014  Melissa Valdez, identified by name and date of birth, is a 48 y.o. female with a diagnosis of Diabetes: Type 2 (Diagnosed this year).  Other people present during visit:  Patient   ASSESSMENT  Height 5' (1.524 m), weight 228 lb 1.6 oz (103.465 kg), last menstrual period 03/03/2014. Body mass index is 44.55 kg/(m^2).  Initial Visit Information:  Are you currently following a meal plan?: No   Are you taking your medications as prescribed?: Yes Are you checking your feet?: Yes How many days per week are you checking your feet?: 7 How often do you need to have someone help you when you read instructions, pamphlets, or other written materials from your doctor or pharmacy?: 1 - Never    Psychosocial:     Patient Belief/Attitude about Diabetes: Motivated to manage diabetes Other persons present: Patient Patient Concerns: Nutrition/Meal planning Preferred Learning Style: Auditory, Hands on Learning Readiness: Ready  Complications:   Last HgB A1C per patient/outside source: 9.5 mg/dL How often do you check your blood sugar?: 3-4 times/day Fasting Blood glucose range (mg/dL): 130-179 Postprandial Blood glucose range (mg/dL): 180-200 Number of hypoglycemic episodes per month: 0 Have you had a dilated eye exam in the past 12 months?: Yes Have you had a dental exam in the past 12 months?: No  Diet Intake:  Breakfast: 2 eggs, liver pudding or 3 bacon OR oatmeal OR 2 toast plain, with 8 oz OJ OR Apple Juice Snack (morning): not usually Lunch: skips 3 days a week, left overs OR Salad with cheese and lunch meat, variety of salad dressings, Slim Fast to drink Snack (afternoon): not usually Dinner: lean meat, vegetable, baked beans or other starch, occasionally with bread, water Snack (evening): no Beverage(s): fruit juice, Slim Fast, water, or whole  milk  Exercise:  Exercise: ADL's (used to walk until got leg and hip pain, then had to stop)  Individualized Plan for Diabetes Self-Management Training:   Learning Objective:  Patient will have a greater understanding of diabetes self-management.  Patient education plan per assessed needs and concerns is to attend individual sessions for     Education Topics Reviewed with Patient Today:  Definition of diabetes, type 1 and 2, and the diagnosis of diabetes, Factors that contribute to the development of diabetes Role of diet in the treatment of diabetes and the relationship between the three main macronutrients and blood glucose level, Carbohydrate counting, Reviewed blood glucose goals for pre and post meals and how to evaluate the patients' food intake on their blood glucose level. Role of exercise on diabetes management, blood pressure control and cardiac health., Helped patient identify appropriate exercises in relation to his/her diabetes, diabetes complications and other health issue. Reviewed patients medication for diabetes, action, purpose, timing of dose and side effects. Identified appropriate SMBG and/or A1C goals., Purpose and frequency of SMBG.     Role of stress on diabetes      PATIENTS GOALS/Plan (Developed by the patient):     Plan:   Patient Instructions  Plan:  Consider looking at your foods by Food Group and practice that Starch, Fruit and Milk are Carb Choices, Veg, Meat and Fat are not.  Include protein in moderation with your meals and snacks Consider  increasing your activity level by Arm Chair Exercises for 5-10 minutes several times each day as tolerated Continue checking BG at alternate times per  day as directed by MD  Consider asking your MD about increasing your Metformin dose     Expected Outcomes:     Education material provided: Living Well with Diabetes, A1C conversion sheet, Meal plan card and Carbohydrate counting sheet  If problems or  questions, patient to contact team via:  Phone and Email  Future DSME appointment:  Plan follow up in 6 weeks

## 2014-03-15 NOTE — Patient Instructions (Signed)
Plan:  Consider looking at your foods by Food Group and practice that Starch, Fruit and Milk are Carb Choices, Veg, Meat and Fat are not.  Include protein in moderation with your meals and snacks Consider  increasing your activity level by Arm Chair Exercises for 5-10 minutes several times each day as tolerated Continue checking BG at alternate times per day as directed by MD  Consider asking your MD about increasing your Metformin dose

## 2014-03-19 ENCOUNTER — Ambulatory Visit (HOSPITAL_COMMUNITY)
Admission: RE | Admit: 2014-03-19 | Payer: Medicaid Other | Source: Ambulatory Visit | Admitting: Obstetrics & Gynecology

## 2014-03-19 ENCOUNTER — Encounter (HOSPITAL_COMMUNITY): Admission: RE | Payer: Self-pay | Source: Ambulatory Visit

## 2014-03-19 SURGERY — ROBOTIC ASSISTED TOTAL HYSTERECTOMY
Anesthesia: Choice

## 2014-04-12 ENCOUNTER — Encounter: Payer: Self-pay | Admitting: *Deleted

## 2014-04-13 ENCOUNTER — Encounter: Payer: Self-pay | Admitting: Obstetrics & Gynecology

## 2014-04-30 ENCOUNTER — Ambulatory Visit (HOSPITAL_COMMUNITY)
Admission: RE | Admit: 2014-04-30 | Discharge: 2014-04-30 | Disposition: A | Discharge status: Home / Self Care | Payer: Medicaid Other | Source: Ambulatory Visit | Attending: Internal Medicine | Admitting: Internal Medicine

## 2014-04-30 ENCOUNTER — Other Ambulatory Visit (HOSPITAL_COMMUNITY): Payer: Self-pay | Admitting: Internal Medicine

## 2014-04-30 ENCOUNTER — Encounter: Payer: Medicaid Other | Attending: Internal Medicine | Admitting: *Deleted

## 2014-04-30 VITALS — Ht 60.0 in | Wt 222.1 lb

## 2014-04-30 DIAGNOSIS — E118 Type 2 diabetes mellitus with unspecified complications: Secondary | ICD-10-CM | POA: Diagnosis present

## 2014-04-30 DIAGNOSIS — M47896 Other spondylosis, lumbar region: Secondary | ICD-10-CM | POA: Diagnosis not present

## 2014-04-30 DIAGNOSIS — M545 Low back pain: Secondary | ICD-10-CM

## 2014-04-30 DIAGNOSIS — Z713 Dietary counseling and surveillance: Secondary | ICD-10-CM | POA: Insufficient documentation

## 2014-04-30 DIAGNOSIS — E119 Type 2 diabetes mellitus without complications: Secondary | ICD-10-CM

## 2014-04-30 DIAGNOSIS — M419 Scoliosis, unspecified: Secondary | ICD-10-CM | POA: Insufficient documentation

## 2014-04-30 NOTE — Progress Notes (Signed)
Diabetes Self-Management Education  Visit Type:   Follow up visit  Appt. Start Time: 0900 Appt. End Time: 0930  04/30/2014  Melissa Valdez, identified by name and date of birth, is a 49 y.o. female with a diagnosis of Diabetes: Type 2.  Other people present during visit:  Patient   ASSESSMENT  Height 5' (1.524 m), weight 222 lb 1.6 oz (100.744 kg), last menstrual period 04/20/2014. Body mass index is 43.38 kg/(m^2).    Subsequent Visit Information:  Since your last visit, have you continued or began the use of a meal plan?: Yes How many days a week are you following a meal plan?: 7 Since your last visit, have you continued or began to exercise on a consistent basis?: Yes How many days per week are you exercising or participating in a physicial activity for more than 20 minutes?: 7 (Arm Chair Exercises, she started with 5-10 minutes, now up to 20 minutes and growing) Since your last visit have you experienced any weight changes?: Loss Weight Loss (lbs): 6 Since your last visit, are you checking your blood glucose at least once a day?: Yes  Psychosocial:     Patient Belief/Attitude about Diabetes: Motivated to manage diabetes Self-care barriers: None Other persons present: Patient Patient Concerns: Nutrition/Meal planning, Weight Control, Monitoring Special Needs: None Learning Readiness: Change in progress  Complications:   How often do you check your blood sugar?: 1-2 times/day Fasting Blood glucose range (mg/dL): 180-200 Postprandial Blood glucose range (mg/dL): 180-200 Number of hypoglycemic episodes per month: 0  Diet Intake:     Exercise:  Exercise: Light (walking / raking leaves) (Arm Chair Exercises at home every AM)   Individualized Plan for Diabetes Self-Management Training:   Learning Objective:  Patient will have a greater understanding of diabetes self-management.  Patient education plan per assessed needs and concerns is to attend individual  sessions for    Education Topics Reviewed with Patient Today:    Reviewed blood glucose goals for pre and post meals and how to evaluate the patients' food intake on their blood glucose level. Role of exercise on diabetes management, blood pressure control and cardiac health.   Identified appropriate SMBG and/or A1C goals.            PATIENTS GOALS/Plan (Developed by the patient):  Nutrition: Follow meal plan discussed Physical Activity: Exercise 5-7 days per week Monitoring : test blood glucose pre and post meals as discussed  Patient Self Evaluation of Goals - Patient rates self as meeting previously set goals:   Nutrition: >75% Physical Activity: >75% Medications: >75% Monitoring: >75% Problem Solving: >75% Reducing Risk: >75% Health Coping: >75%   Plan:   Patient Instructions  Plan:  Consider 2 Carb Choices per meal (30 grams) +/- 1 either way (Starch, Fruit and Milk are Carb Choices, Veg, Meat and Fat are not.)  Continue to nclude leanprotein in moderation with your meals and snacks Continue  increasing your activity level of Arm Chair Exercises by 5  minutes each week. Great job doing it every day! Continue checking BG at alternate times per day as directed by MD        Expected Outcomes:  Demonstrated interest in learning. Expect positive outcomes  Education material provided: commended her on her success with a "Star"  If problems or questions, patient to contact team via:  Phone and Email  Future DSME appointment: - 3-4 months

## 2014-04-30 NOTE — Patient Instructions (Signed)
Plan:  Consider 2 Carb Choices per meal (30 grams) +/- 1 either way (Starch, Fruit and Milk are Carb Choices, Veg, Meat and Fat are not.)  Continue to nclude leanprotein in moderation with your meals and snacks Continue  increasing your activity level of Arm Chair Exercises by 5  minutes each week. Great job doing it every day! Continue checking BG at alternate times per day as directed by MD

## 2014-06-01 ENCOUNTER — Ambulatory Visit: Payer: Medicaid Other | Admitting: Obstetrics

## 2014-06-09 ENCOUNTER — Telehealth: Payer: Self-pay | Admitting: *Deleted

## 2014-06-09 ENCOUNTER — Ambulatory Visit (INDEPENDENT_AMBULATORY_CARE_PROVIDER_SITE_OTHER): Payer: Medicaid Other | Admitting: Obstetrics

## 2014-06-09 ENCOUNTER — Telehealth: Payer: Self-pay

## 2014-06-09 ENCOUNTER — Encounter: Payer: Self-pay | Admitting: Obstetrics

## 2014-06-09 VITALS — BP 147/98 | HR 83 | Temp 98.6°F | Ht 60.0 in | Wt 226.0 lb

## 2014-06-09 DIAGNOSIS — N939 Abnormal uterine and vaginal bleeding, unspecified: Secondary | ICD-10-CM

## 2014-06-09 DIAGNOSIS — N946 Dysmenorrhea, unspecified: Secondary | ICD-10-CM

## 2014-06-09 DIAGNOSIS — D251 Intramural leiomyoma of uterus: Secondary | ICD-10-CM

## 2014-06-09 NOTE — Telephone Encounter (Signed)
PATIENT HAS APPT WITH DR Chrissie Noa MOORE AT [facility] ON 06/28/14 AT 9AM, PATIENT WAS HERE AND GIVEN THEIR ADDRESS AND PHONE NUMBER

## 2014-06-09 NOTE — Telephone Encounter (Signed)
2:25 Spoke with [patient - patient state she is having transportation issues and she doesn't think she can go to see Dr Delsa Sale.patient understands that Dr Jodi Mourning does not do robotic surgery and would like to see if he would consider doing her hysterectomy abdominally or vaginally. Told patient I would check with him and call her back with his response.

## 2014-06-09 NOTE — Progress Notes (Signed)
Patient ID: Melissa Valdez, female   DOB: 05-30-1965, 49 y.o.   MRN: [id number]  Chief Complaint  Patient presents with  . Gynecologic Exam    follow up- bleeding    HPI Melissa Valdez is a 49 y.o. female.  H/O of AUB.  Was evaluated by Dr. Delsa Valdez for Robotic assisted TLH.  She states that she would like to continue that work up with Dr. Merrilee Valdez.  HPI  Past Medical History  Diagnosis Date  . Hypertension   . Sleep apnea     cpap-   . Diabetes mellitus without complication   . GERD (gastroesophageal reflux disease)     Past Surgical History  Procedure Laterality Date  . Tubal ligation  1999  . Boil on leg removed   1980s    Family History  Problem Relation Age of Onset  . Hypertension Mother   . Diabetes Father     Social History History  Substance Use Topics  . Smoking status: Current Every Day Smoker -- 0.25 packs/day    Types: Cigarettes    Last Attempt to Quit: 12/29/2013  . Smokeless tobacco: Never Used  . Alcohol Use: 1.8 oz/week    2 Cans of beer, 1 Shots of liquor per week    No Known Allergies  Current Outpatient Prescriptions  Medication Sig Dispense Refill  . diclofenac (VOLTAREN) 50 MG EC tablet Take 50 mg by mouth 2 (two) times daily.    . hydrochlorothiazide (HYDRODIURIL) 25 MG tablet Take 25 mg by mouth every morning.    Marland Kitchen ibuprofen (ADVIL,MOTRIN) 200 MG tablet Take 400 mg by mouth every 6 (six) hours as needed. As needed for pain.    . meloxicam (MOBIC) 7.5 MG tablet Take 7.5 mg by mouth 2 (two) times daily.    . metFORMIN (GLUCOPHAGE) 500 MG tablet Take 500 mg by mouth 2 (two) times daily with a meal.    . omeprazole (PRILOSEC) 20 MG capsule Take 20 mg by mouth daily.    . cyclobenzaprine (FLEXERIL) 5 MG tablet Take 5 mg by mouth 2 (two) times daily as needed for muscle spasms.    . metroNIDAZOLE (FLAGYL) 500 MG tablet Take 1 tablet (500 mg total) by mouth 2 (two) times daily. (Patient not taking: Reported on 03/04/2014) 14 tablet 0  .  norethindrone (ORTHO MICRONOR) 0.35 MG tablet Take 1 tablet (0.35 mg total) by mouth daily. (Patient not taking: Reported on 06/09/2014) 1 Package 1   No current facility-administered medications for this visit.    Review of Systems Review of Systems Constitutional: negative for fatigue and weight loss Respiratory: negative for cough and wheezing Cardiovascular: negative for chest pain, fatigue and palpitations Gastrointestinal: negative for abdominal pain and change in bowel habits Genitourinary: positive for AUB Integument/breast: negative for nipple discharge Musculoskeletal:negative for myalgias Neurological: negative for gait problems and tremors Behavioral/Psych: negative for abusive relationship, depression Endocrine: negative for temperature intolerance     Blood pressure 147/98, pulse 83, temperature 98.6 F (37 C), height 5' (1.524 m), weight 226 lb (102.513 kg), last menstrual period 05/20/2014.  Physical Exam Physical Exam:  Deferred  100% of 10 min visit spent on counseling and coordination of care.   Data Reviewed Labs  Assessment     AUB Symptomatic Uterine Fibroids     Plan    F/U with Dr.Jackson Valdez  No orders of the defined types were placed in this encounter.   Meds ordered this encounter  Medications  . meloxicam (MOBIC) 7.5 MG  tablet    Sig: Take 7.5 mg by mouth 2 (two) times daily.

## 2014-06-10 NOTE — Telephone Encounter (Signed)
Not a good candidate for abdominal or vaginal hysterectomy.  May see if Dr. Merrilee Jansky can see her at [facility] for pre op visit.

## 2014-06-10 NOTE — Telephone Encounter (Signed)
Per Dr Jodi Mourning- he spoke with Dr Delsa Sale and she gave permission for [place] to call to see if the patient could be scheduled with Melissa at the cancer center. Call to them with the patient's info and patient notified to expect a call.

## 2014-06-14 ENCOUNTER — Telehealth: Payer: Self-pay | Admitting: *Deleted

## 2014-06-14 NOTE — Telephone Encounter (Signed)
Will check with Dr Jodi Mourning to see if patient can be referred to Dr Denman George or do we need to send her to teaching service.

## 2014-06-15 ENCOUNTER — Telehealth: Payer: Self-pay | Admitting: *Deleted

## 2014-06-15 NOTE — Telephone Encounter (Signed)
Called patient with new patient appt for 06/28/14 at 9:45am. Patient wrote down appt and is agreeable to come. Gave patient the telephone number for gyn onc clinic in case she needs to reschedule appt for any reason.

## 2014-06-28 ENCOUNTER — Ambulatory Visit: Payer: Medicaid Other

## 2014-06-28 ENCOUNTER — Ambulatory Visit: Payer: Medicaid Other | Attending: Gynecologic Oncology | Admitting: Gynecologic Oncology

## 2014-06-28 ENCOUNTER — Encounter: Payer: Self-pay | Admitting: Gynecologic Oncology

## 2014-06-28 VITALS — BP 141/73 | HR 69 | Temp 98.3°F | Resp 18 | Ht 60.0 in | Wt 227.5 lb

## 2014-06-28 DIAGNOSIS — Z6841 Body Mass Index (BMI) 40.0 and over, adult: Secondary | ICD-10-CM | POA: Diagnosis not present

## 2014-06-28 DIAGNOSIS — E1165 Type 2 diabetes mellitus with hyperglycemia: Secondary | ICD-10-CM | POA: Insufficient documentation

## 2014-06-28 DIAGNOSIS — Z79899 Other long term (current) drug therapy: Secondary | ICD-10-CM | POA: Insufficient documentation

## 2014-06-28 DIAGNOSIS — D259 Leiomyoma of uterus, unspecified: Secondary | ICD-10-CM | POA: Diagnosis present

## 2014-06-28 DIAGNOSIS — N939 Abnormal uterine and vaginal bleeding, unspecified: Secondary | ICD-10-CM | POA: Diagnosis not present

## 2014-06-28 DIAGNOSIS — I10 Essential (primary) hypertension: Secondary | ICD-10-CM | POA: Diagnosis not present

## 2014-06-28 DIAGNOSIS — Z791 Long term (current) use of non-steroidal anti-inflammatories (NSAID): Secondary | ICD-10-CM | POA: Insufficient documentation

## 2014-06-28 LAB — HEMOGLOBIN A1C
Hgb A1c MFr Bld: 8.9 % — ABNORMAL HIGH (ref <5.7)
Mean Plasma Glucose: 209 mg/dL — ABNORMAL HIGH (ref <117)

## 2014-06-28 NOTE — Progress Notes (Signed)
Consult Note: Gyn-Onc  Consult was requested by Dr. Jodi Mourning for the evaluation of Melissa Valdez 49 y.o. female with abnormal uterine bleeding and fibroids  CC:  Chief Complaint  Patient presents with  . Fibroids  . abnormal uterine bleeding    Assessment/Plan:  [name]  is a 49 y.o.  year old with symptomatic uterine fibroids 2 desires hysterectomy. She has multiple medical comorbidities including class 3 morbid obesity, poorly controlled diabetes mellitus, and hypertension. Upon deeper questioning of her desire for hysterectomy Ms. Reimann reports that she desires a hysterectomy because she otherwise cannot lose weight and she believes that hysterectomy will assist in weight loss.  She is not anemic and has a regular menstrual cycle with no intermenstrual bleeding. The biopsy from the endometrium today will rule out endometrial cancer hyperplasia which would necessitate hysterectomy.  Her pain is not consistent with fibroid pain and she is no mass effect symptoms.  Therefore, I do not believe there is a strong indication for hysterectomy in this patient at this time particularly with her coexisting comorbidities. I discussed with the patient that hysterectomy will not result in weight loss, and in fact in many patients is associated with postoperative weight gain due to inactivity or hormone loss if ovaries removed. Additionally she is transitioning through menopause and her self-reported heavy menses we'll likely stop within the next 2 years. Given that her bleeding does not require transfusion wine replacement therapy, I do not believe that hysterectomy will medically improve her health.  I discussed the risks of hysterectomy including bleeding, infection (in a patient with poorly controlled diabetes and obesity this rate is as high as 50%), damage to internal organs (which is higher in a morbidly obese patient closed) including damage to visceral structures such as hours in bladder  and fistula formation, injury to nerves or muscles or joints with positioning, VTE, anesthesia complications including death. I discussed that I would not contemplate a hysterectomy for her unless her HbA1c was less than 7%. We have redrawn this today to assess where she is at after her efforts to increase activity and lose weight. If it is greater than 7% I recommend that she follow-up with her primary care doctor she may require additional management of medications to optimize her blood glucose. I also strongly recommended efforts to lose weight as her morbid obesity significantly complicates an elective surgery.  I discussed alternative options to hysterectomy including a progestin releasing IUD, or Depo Provera. I would favor an IUD as Depo Provera and be associated with increased appetite and subsequent weight gain in patients.  The patient will consider these options including robotic hysterectomy and notify me if she desires to pursue hysterectomy. Once again, I would not move forward with hysterectomy until the patient has demonstrated optimize blood glucose control.  HPI: Melissa Valdez is a 49 year old gravida 2 para 2 who is seen in consultation at the request of Dr. Jodi Mourning for symptomatic uterine fibroids. The patient reports having 228 day menstrual cycles the last 4 days. The first 2 days are self-reported to be heavy and she requires use of per temp once and pads during this time. She had a hemoglobin drawn as part of the CBC in November 2015 and this was normal at 11.8. She denies symptoms of anemia. She denies intermenstrual bleeding.  She reports on this week past weekend that she had symptoms of crampy left mid abdominal pain and believes this was secondary to the fibroids. It has completely resolved and  required no interventions.  She denies symptoms of urinary frequency, pelvic pressure, or constipation or difficulty passing stools.  She has poorly controlled type 2 diabetes mellitus  her HbA1c in October 2015 was 10.3. On 03/21/2014 it was 9.5. She's been attempting to optimize this with increased activity. She has a primary care doctor who helps manage her diabetic medications and hypertensive medications. Other major medical comorbidities include class III morbid obesity (BMI 44.4 kg/m), and hypertension.  She is no history of abnormal Pap smears. She is history of 2 prior vaginal deliveries and a tubal ligation. She has not tried medical management for her fibroids. Her mother passed her menopause in her late 96s. The patient denies menopausal symptoms such as vasomotor symptoms or sleep disturbance.  Upon deeper questioning she reports that she believes that hysterectomy will assist with weight loss.    Current Meds:  Outpatient Encounter Prescriptions as of 06/28/2014  Medication Sig  . ACCU-CHEK AVIVA PLUS test strip   . diclofenac (VOLTAREN) 50 MG EC tablet Take 50 mg by mouth 2 (two) times daily.  . hydrochlorothiazide (HYDRODIURIL) 25 MG tablet Take 25 mg by mouth every morning.  Marland Kitchen ibuprofen (ADVIL,MOTRIN) 200 MG tablet Take 400 mg by mouth every 6 (six) hours as needed. As needed for pain.  . meloxicam (MOBIC) 7.5 MG tablet Take 7.5 mg by mouth 2 (two) times daily as needed.   . metFORMIN (GLUCOPHAGE) 500 MG tablet Take 500 mg by mouth 2 (two) times daily with a meal.  . omeprazole (PRILOSEC) 20 MG capsule Take 20 mg by mouth daily.  . norethindrone (ORTHO MICRONOR) 0.35 MG tablet Take 1 tablet (0.35 mg total) by mouth daily. (Patient not taking: Reported on 06/09/2014)  . [DISCONTINUED] cyclobenzaprine (FLEXERIL) 5 MG tablet Take 5 mg by mouth 2 (two) times daily as needed for muscle spasms.  . [DISCONTINUED] diclofenac (CATAFLAM) 50 MG tablet   . [DISCONTINUED] metroNIDAZOLE (FLAGYL) 500 MG tablet Take 1 tablet (500 mg total) by mouth 2 (two) times daily. (Patient not taking: Reported on 03/04/2014)    Allergy: No Known Allergies  Social Hx:   History    Social History  . Marital Status: Single    Spouse Name: N/A  . Number of Children: N/A  . Years of Education: N/A   Occupational History  . Not on file.   Social History Main Topics  . Smoking status: Current Every Day Smoker -- 0.25 packs/day    Types: Cigarettes    Last Attempt to Quit: 12/29/2013  . Smokeless tobacco: Never Used  . Alcohol Use: 1.8 oz/week    2 Cans of beer, 1 Shots of liquor per week  . Drug Use: No  . Sexual Activity:    Partners: Male    Birth Control/ Protection: Surgical     Comment: Last Encnounter 4 days ago   Other Topics Concern  . Not on file   Social History Narrative    Past Surgical Hx:  Past Surgical History  Procedure Laterality Date  . Tubal ligation  1999  . Boil on leg removed   1980s    Past Medical Hx:  Past Medical History  Diagnosis Date  . Hypertension   . Sleep apnea     cpap-   . Diabetes mellitus without complication   . GERD (gastroesophageal reflux disease)   . Obesity, Class III, BMI 40-49.9 (morbid obesity)     Past Gynecological History:  See HPI. Pap smear normal October 2015. SVD x 2.  Tubal ligation  Patient's last menstrual period was 05/20/2014 (approximate).  Family Hx:  Family History  Problem Relation Age of Onset  . Hypertension Mother   . Diabetes Father     Review of Systems:  Constitutional  Feels well,    ENT Normal appearing ears and nares bilaterally Skin/Breast  No rash, sores, jaundice, itching, dryness Cardiovascular  No chest pain, shortness of breath, or edema  Pulmonary  No cough or wheeze.  Gastro Intestinal  No nausea, vomitting, or diarrhoea. No bright red blood per rectum, no abdominal pain, change in bowel movement, or constipation.  Genito Urinary  No frequency, urgency, dysuria,  Musculo Skeletal  No myalgia, arthralgia, joint swelling or pain  Neurologic  No weakness, numbness, change in gait,  Psychology  No depression, anxiety, insomnia.   Vitals:  Blood  pressure 141/73, pulse 69, temperature 98.3 F (36.8 C), temperature source Oral, resp. rate 18, height 5' (1.524 m), weight 227 lb 8 oz (103.193 kg), last menstrual period 05/20/2014, SpO2 100 %.  Physical Exam: WD in NAD Neck  Supple NROM, without any enlargements.  Lymph Node Survey No cervical supraclavicular or inguinal adenopathy Cardiovascular  Pulse normal rate, regularity and rhythm. S1 and S2 normal.  Lungs  Clear to auscultation bilateraly, without wheezes/crackles/rhonchi. Good air movement.  Skin  No rash/lesions/breakdown  Psychiatry  Alert and oriented to person, place, and time  Abdomen  Normoactive bowel sounds, abdomen soft, non-tender and obese without evidence of hernia.  Back No CVA tenderness Genito Urinary  Vulva/vagina: Normal external female genitalia.   No lesions. No discharge or bleeding.  Bladder/urethra:  No lesions or masses, well supported bladder  Vagina: normal, difficult to access due to very narrow pelvic arch and thigh obesity.  Cervix: Normal appearing, no lesions.  Uterus: Moderately bulky, 12 week size, minimally mobile, no parametrial involvement or nodularity.  Adnexa: no palpable masses though exam limited secondary to body habitus.  PROCEDURE: ENDOMETRIAL BIOPSY The patient provided them consent for the procedure. Speculum was placed in the vagina. The cervix was grasped on the anterior lip with a single-toothed tenaculum. A single past with a Pipelle was made to a depth of 7 cm. The endometrial contents were aspirated and delivered into a specimen container. The tenaculum was removed and hemostasis was achieved at the cervical puncture sites with silver nitrate. The patient tolerated the procedure well. Rectal  Good tone, no masses no cul de sac nodularity.  Extremities  No bilateral cyanosis, clubbing or edema.   Donaciano Eva, MD   06/28/2014, 10:29 AM

## 2014-06-28 NOTE — Patient Instructions (Signed)
We will call you the biopsy and A1C results from today.  Please call our office with your decision regarding surgery or with any questions or concerns you have.

## 2014-06-29 ENCOUNTER — Telehealth: Payer: Self-pay | Admitting: *Deleted

## 2014-06-29 NOTE — Telephone Encounter (Signed)
Called pt, unable to reach lmovm [facility] is going down, which is good news, pt will still need to see her PCP.

## 2014-06-30 ENCOUNTER — Telehealth: Payer: Self-pay | Admitting: *Deleted

## 2014-06-30 NOTE — Telephone Encounter (Signed)
Per Dr. Denman George, patient called with benign biopsy results. Told patient her Hemoglobin A1C was 8.9 as compared with 9.3 several months ago. Encouraged patient to f/u with her PCP though to continue working on getting it lower and closer to normal. Patient agreeable to this and appreciative of call.

## 2014-07-02 ENCOUNTER — Ambulatory Visit: Payer: Medicaid Other | Admitting: *Deleted

## 2014-10-28 ENCOUNTER — Encounter (HOSPITAL_COMMUNITY): Payer: Self-pay | Admitting: Neurology

## 2014-10-28 ENCOUNTER — Emergency Department (HOSPITAL_COMMUNITY)
Admission: EM | Admit: 2014-10-28 | Discharge: 2014-10-28 | Disposition: A | Discharge status: Home / Self Care | Payer: Medicaid Other | Attending: Internal Medicine | Admitting: Internal Medicine

## 2014-10-28 ENCOUNTER — Emergency Department (HOSPITAL_COMMUNITY): Payer: Medicaid Other

## 2014-10-28 DIAGNOSIS — R739 Hyperglycemia, unspecified: Secondary | ICD-10-CM | POA: Diagnosis not present

## 2014-10-28 DIAGNOSIS — R42 Dizziness and giddiness: Secondary | ICD-10-CM | POA: Diagnosis present

## 2014-10-28 DIAGNOSIS — L02519 Cutaneous abscess of unspecified hand: Secondary | ICD-10-CM

## 2014-10-28 DIAGNOSIS — E119 Type 2 diabetes mellitus without complications: Secondary | ICD-10-CM | POA: Diagnosis not present

## 2014-10-28 DIAGNOSIS — Z79899 Other long term (current) drug therapy: Secondary | ICD-10-CM | POA: Diagnosis not present

## 2014-10-28 DIAGNOSIS — G4730 Sleep apnea, unspecified: Secondary | ICD-10-CM | POA: Diagnosis not present

## 2014-10-28 DIAGNOSIS — L03119 Cellulitis of unspecified part of limb: Secondary | ICD-10-CM

## 2014-10-28 DIAGNOSIS — Z6841 Body Mass Index (BMI) 40.0 and over, adult: Secondary | ICD-10-CM | POA: Insufficient documentation

## 2014-10-28 DIAGNOSIS — F17210 Nicotine dependence, cigarettes, uncomplicated: Secondary | ICD-10-CM | POA: Diagnosis not present

## 2014-10-28 DIAGNOSIS — E876 Hypokalemia: Secondary | ICD-10-CM

## 2014-10-28 DIAGNOSIS — J069 Acute upper respiratory infection, unspecified: Secondary | ICD-10-CM

## 2014-10-28 DIAGNOSIS — I10 Essential (primary) hypertension: Secondary | ICD-10-CM

## 2014-10-28 LAB — CBC
HEMATOCRIT: 40.6 % (ref 36.0–46.0)
Hemoglobin: 13.1 g/dL (ref 12.0–15.0)
MCH: 24.3 pg — AB (ref 26.0–34.0)
MCHC: 32.3 g/dL (ref 30.0–36.0)
MCV: 75.5 fL — ABNORMAL LOW (ref 78.0–100.0)
Platelets: 301 10*3/uL (ref 150–400)
RBC: 5.38 MIL/uL — AB (ref 3.87–5.11)
RDW: 15.5 % (ref 11.5–15.5)
WBC: 10.8 10*3/uL — AB (ref 4.0–10.5)

## 2014-10-28 LAB — COMPREHENSIVE METABOLIC PANEL
ALT: 27 U/L (ref 14–54)
AST: 25 U/L (ref 15–41)
Albumin: 4.2 g/dL (ref 3.5–5.0)
Alkaline Phosphatase: 129 U/L — ABNORMAL HIGH (ref 38–126)
Anion gap: 13 (ref 5–15)
BILIRUBIN TOTAL: 0.6 mg/dL (ref 0.3–1.2)
BUN: 8 mg/dL (ref 6–20)
CO2: 27 mmol/L (ref 22–32)
Calcium: 10.2 mg/dL (ref 8.9–10.3)
Chloride: 98 mmol/L — ABNORMAL LOW (ref 101–111)
Creatinine, Ser: 0.59 mg/dL (ref 0.44–1.00)
GFR calc non Af Amer: >60 mL/min (ref >60)
Glucose, Bld: 476 mg/dL — ABNORMAL HIGH (ref 65–99)
Potassium: 3.4 mmol/L — ABNORMAL LOW (ref 3.5–5.1)
SODIUM: 138 mmol/L (ref 135–145)
TOTAL PROTEIN: 7.9 g/dL (ref 6.5–8.1)

## 2014-10-28 LAB — LIPASE, BLOOD: Lipase: 27 U/L (ref 22–51)

## 2014-10-28 LAB — CBG MONITORING, ED: Glucose-Capillary: 473 mg/dL — ABNORMAL HIGH (ref 65–99)

## 2014-10-28 MED ORDER — ONDANSETRON 4 MG PO TBDP
4.0000 mg | ORAL_TABLET | Freq: Once | ORAL | Status: AC | PRN
  Administered 2014-10-28: 4 mg via ORAL

## 2014-10-28 MED ORDER — ONDANSETRON 4 MG PO TBDP
ORAL_TABLET | ORAL | Status: AC
  Filled 2014-10-28: qty 1

## 2014-10-28 MED ORDER — ONDANSETRON HCL 4 MG/2ML IJ SOLN: 4.0000 mg | Freq: Once | INTRAMUSCULAR | Status: DC

## 2014-10-28 MED ORDER — SODIUM CHLORIDE 0.9 % IV BOLUS (SEPSIS)
1000.0000 mL | Freq: Once | INTRAVENOUS | Status: AC
  Administered 2014-10-28: 1000 mL via INTRAVENOUS

## 2014-10-28 NOTE — ED Provider Notes (Signed)
CSN: [id number]     Arrival date & time 10/28/14  1324 History   First MD Initiated Contact with Patient 10/28/14 1828     Chief Complaint  Patient presents with  . Dizziness     (Consider location/radiation/quality/duration/timing/severity/associated sxs/prior Treatment) HPI Comments: Patient is a 49 year old female with a history of diabetes on oral therapy, GERD, hypertension who presents today with his sensation of dizziness. She describes the dizziness as feeling lightheaded like she might pass out. She also had nausea and one episode of vomiting. This started today when she noticed her blood sugar greater than 500. She states for the last 1-2 months her blood sugar has been elevated sometimes to 3 and 400. She's been taking her blood sugar medication however she is not following a diabetic diet. Also in the last 2 weeks she has had cough, sinus congestion, rhinorrhea. No fever or shortness of breath.  Patient denies abdominal pain.  She does smoke daily and denies any excessive alcohol use. No urinary symptoms.  Patient is a 49 y.o. female presenting with dizziness. The history is provided by the patient.  Dizziness   Past Medical History  Diagnosis Date  . Hypertension   . Sleep apnea     cpap-   . Diabetes mellitus without complication   . GERD (gastroesophageal reflux disease)   . Obesity, Class III, BMI 40-49.9 (morbid obesity)    Past Surgical History  Procedure Laterality Date  . Tubal ligation  1999  . Boil on leg removed   1980s   Family History  Problem Relation Age of Onset  . Hypertension Mother   . Diabetes Father    History  Substance Use Topics  . Smoking status: Current Every Day Smoker -- 0.25 packs/day    Types: Cigarettes    Last Attempt to Quit: 12/29/2013  . Smokeless tobacco: Never Used  . Alcohol Use: 1.8 oz/week    2 Cans of beer, 1 Shots of liquor per week   OB History    Gravida Para Term Preterm AB TAB SAB Ectopic Multiple Living   2 2 2        2      Review of Systems  Neurological: Positive for dizziness.  All other systems reviewed and are negative.     Allergies  Review of patient's allergies indicates no known allergies.  Home Medications   Prior to Admission medications   Medication Sig Start Date End Date Taking? Authorizing Provider  ACCU-CHEK AVIVA PLUS test strip  04/26/14  Yes Historical Provider, MD  cyclobenzaprine (FLEXERIL) 10 MG tablet Take 10 mg by mouth 3 (three) times daily as needed for muscle spasms.   Yes Historical Provider, MD  diclofenac (VOLTAREN) 50 MG EC tablet Take 50 mg by mouth 2 (two) times daily.   Yes Historical Provider, MD  hydrochlorothiazide (HYDRODIURIL) 25 MG tablet Take 25 mg by mouth every morning.   Yes Historical Provider, MD  metFORMIN (GLUCOPHAGE) 500 MG tablet Take 500 mg by mouth 2 (two) times daily with a meal.   Yes Historical Provider, MD  omeprazole (PRILOSEC) 20 MG capsule Take 20 mg by mouth daily.   Yes Historical Provider, MD  sitaGLIPtin (JANUVIA) 100 MG tablet Take 100 mg by mouth daily.   Yes Historical Provider, MD  norethindrone (ORTHO MICRONOR) 0.35 MG tablet Take 1 tablet (0.35 mg total) by mouth daily. Patient not taking: Reported on 06/09/2014 02/24/14   Lahoma Crocker, MD   BP 145/92 mmHg  Pulse 87  Temp(Src) 98.3 F (36.8 C) (Oral)  Resp 20  Ht 5' (1.524 m)  Wt 216 lb (97.977 kg)  BMI 42.18 kg/m2  SpO2 93%  LMP 10/07/2014 Physical Exam  Constitutional: She is oriented to person, place, and time. She appears well-developed and well-nourished. No distress.  HENT:  Head: Normocephalic and atraumatic.  Mouth/Throat: Oropharynx is clear and moist.  Eyes: Conjunctivae and EOM are normal. Pupils are equal, round, and reactive to light.  Neck: Normal range of motion. Neck supple.  Cardiovascular: Normal rate, regular rhythm and intact distal pulses.   No murmur heard. Pulmonary/Chest: Effort normal and breath sounds normal. No respiratory distress.  She has no wheezes. She has no rales.  Abdominal: Soft. She exhibits no distension. There is no tenderness. There is no rebound and no guarding.  Musculoskeletal: Normal range of motion. She exhibits no edema or tenderness.  Neurological: She is alert and oriented to person, place, and time.  Skin: Skin is warm and dry. No rash noted. No erythema.  Psychiatric: She has a normal mood and affect. Her behavior is normal.  Nursing note and vitals reviewed.   ED Course  Procedures (including critical care time) Labs Review Labs Reviewed  COMPREHENSIVE METABOLIC PANEL - Abnormal; Notable for the following:    Potassium 3.4 (*)    Chloride 98 (*)    Glucose, Bld 476 (*)    Alkaline Phosphatase 129 (*)    All other components within normal limits  CBC - Abnormal; Notable for the following:    WBC 10.8 (*)    RBC 5.38 (*)    MCV 75.5 (*)    MCH 24.3 (*)    All other components within normal limits  CBG MONITORING, ED - Abnormal; Notable for the following:    Glucose-Capillary 473 (*)    All other components within normal limits  LIPASE, BLOOD    Imaging Review Dg Chest 2 View  10/28/2014   CLINICAL DATA:  dizzyness and emesis since around noon. Hx:DM, CBG 500+  EXAM: CHEST  2 VIEW  COMPARISON:  03/04/2014  FINDINGS: Cardiac silhouette is top-normal in size. No mediastinal or hilar masses or evidence of adenopathy.  Clear lungs.  No pleural effusion or pneumothorax.  Bony thorax is intact.  IMPRESSION: No active cardiopulmonary disease.   Electronically Signed   By: Lajean Manes M.D.   On: 10/28/2014 19:12     EKG Interpretation   Date/Time:  Thursday October 28 2014 14:04:56 EDT Ventricular Rate:  93 PR Interval:  148 QRS Duration: 106 QT Interval:  364 QTC Calculation: 452 R Axis:   101 Text Interpretation:  Normal sinus rhythm Rightward axis No significant  change since last tracing Confirmed by Maryan Rued  MD, Loree Fee ([id number]) on  10/28/2014 6:25:01 PM      MDM   Final  diagnoses:  Hyperglycemia  URI (upper respiratory infection)    Patient presenting today with a history of diabetes, hypertension, GERD with a complaint of feeling dizzy and nauseated. She states for the last 1-2 months her blood sugars have been elevated but today he got very high and it started to make her feel lightheaded. She denies any spinning sensation or vertiginous symptoms concerning for stroke or vertigo. She recently has had upper respiratory infectious symptoms and has had a persistent cough for the last 2 weeks but is starting to feel better.   She's been taking over-the-counter cold medicine and denies any fever.  She's been taking her diabetic medication but states  she's not following a diabetic diet. She is still smoking cigarettes but denies excessive alcohol use. She has no abdominal pain on exam but still has some mild nausea. She denies any urinary symptoms.  Patient's EKG without acute findings. CBC with mild leukocytosis of 10,000 and CMP consistent with hyperglycemia 476. Patient's lipase is 27. Patient given an IV fluid bolus. Chest x-ray pending to rule out pneumonia given recent URI symptoms. IV fluid bolus will recheck blood sugar and may treat with insulin depending on level.  9:08 PM After 1 L of IV fluid patient's blood sugar is now in the 200s. She is tolerating orals without vomiting or dizziness. Her chest x-ray is without signs of new infection. Case management spoke with patient and has them follow-up with the diabetic teaching class.  Blanchie Dessert, MD 10/28/14 2108

## 2014-10-28 NOTE — ED Notes (Signed)
Pts CBG 256

## 2014-10-28 NOTE — ED Notes (Addendum)
Pt reports felt dizziness at 1230, vomiting x 1. Is diabetic and CBG 501. Denies pain, is a x 4 but feels dizzy. Pt vomiting at current.

## 2014-10-28 NOTE — Care Management Note (Signed)
Case Management Note  Patient Details  Name: Kimaya Whitlatch MRN: [id number] Date of Birth: 1965-11-10  Subjective/Objective:                   Patient presented to [facility] ED with c/o elevated blood sugars 500 , dizziness and vomiting . Patient also admits to not following a diabetic diet  Action/Plan: OP Diabetic Education   Expected Discharge Date:     10/28/14             Expected Discharge Plan:     Referral to OP Di[facility] [place]   In-House Referral:     Discharge planning Services   Diabetic Teaching  Post Acute Care Choice:    Choice offered to:     DME Arranged:    DME Agency:     HH Arranged:    [place] Agency:     Status of Service:   10/28/14  Medicare Important Message Given:    Date Medicare IM Given:    Medicare IM give by:    Date Additional Medicare IM Given:    Additional Medicare Important Message give by:     If discussed at [place] of Stay Meetings, dates discussed:    Additional Comments: Patient instructed to contact Dr Jeanie Cooks PCP for ED follow- up. Discussed the importance of PCP follow- up. patient verbalized understanding teach back done . No further ED CM needs identified Laurena Slimmer, RN 10/28/2014, 9:29 PM

## 2014-10-29 LAB — URINALYSIS, ROUTINE W REFLEX MICROSCOPIC
Bilirubin Urine: NEGATIVE
Glucose, UA: >1000 mg/dL — AB
Hgb urine dipstick: NEGATIVE
KETONES UR: 15 mg/dL — AB
Leukocytes, UA: NEGATIVE
NITRITE: NEGATIVE
PH: 5 (ref 5.0–8.0)
Protein, ur: NEGATIVE mg/dL
Specific Gravity, Urine: 1.041 — ABNORMAL HIGH (ref 1.005–1.030)
Urobilinogen, UA: 0.2 mg/dL (ref 0.0–1.0)

## 2014-10-29 LAB — URINE MICROSCOPIC-ADD ON

## 2014-10-29 LAB — CBG MONITORING, ED: GLUCOSE-CAPILLARY: 256 mg/dL — AB (ref 65–99)

## 2014-11-10 ENCOUNTER — Encounter: Payer: Medicaid Other | Attending: Internal Medicine | Admitting: *Deleted

## 2014-11-10 VITALS — Ht 60.0 in | Wt 212.9 lb

## 2014-11-10 DIAGNOSIS — Z6841 Body Mass Index (BMI) 40.0 and over, adult: Secondary | ICD-10-CM | POA: Diagnosis not present

## 2014-11-10 DIAGNOSIS — Z713 Dietary counseling and surveillance: Secondary | ICD-10-CM | POA: Diagnosis not present

## 2014-11-10 DIAGNOSIS — E119 Type 2 diabetes mellitus without complications: Secondary | ICD-10-CM | POA: Diagnosis not present

## 2014-11-10 NOTE — Patient Instructions (Signed)
Plan:  Continue aiming for 2 Carb Choices per meal (30 grams) +/- 1 either way (Starch, Fruit and Milk are Carb Choices, Veg, Meat and Fat are not.)  Consider eating 3 meals a day, even if you get your carbs from fruit and a protein like cheese or peanut butter. Continue to nclude lean protein in moderation with your meals and snacks Continue with your activity level of Arm Chair Exercises and the increased walking with your new job will be great too! Continue checking BG at alternate times per day as directed by MD

## 2014-11-18 ENCOUNTER — Encounter: Payer: Self-pay | Admitting: *Deleted

## 2014-11-18 NOTE — Progress Notes (Signed)
Diabetes Self-Management Education  Visit Type:  Follow-up  Appt. Start Time: 0800 Appt. End Time: 0830  11/18/2014  [name], identified by name and date of birth, is a 49 y.o. female with a diagnosis of Diabetes:  .  Other people present during visit:  Patient   ASSESSMENT  Height 5' (1.524 m), weight 212 lb 14.4 oz (96.571 kg), last menstrual period 10/07/2014. Body mass index is 41.58 kg/(m^2).    Subsequent Visit Information:  Since your last visit, have you continued or began the use of a meal plan?: Yes How many days a week are you following a meal plan?: 5 Since your last visit, have you continued or began to exercise on a consistent basis?: No Since your last visit have you experienced any weight changes?: Loss Weight Loss (lbs): 9.9 Since your last visit, are you checking your blood glucose at least once a day?: Yes  Psychosocial:     Patient Belief/Attitude about Diabetes: Motivated to manage diabetes Other persons present: Patient Patient Concerns: Nutrition/Meal planning, Weight Control, Glycemic Control  Complications:   Last HgB A1C per patient/outside source: 13 % How often do you check your blood sugar?: 1-2 times/day Fasting Blood glucose range (mg/dL): 180-200 Postprandial Blood glucose range (mg/dL): 180-200  Diet Intake:  Breakfast: 1 egg, 2 plain toast, 2 bacon  Lunch: skips usually OR [place] sandwich Dinner: lean meat, starch, vegetable and corn bread, occasionally baked beans  Exercise:  Exercise: ADL's   Individualized Plan for Diabetes Self-Management Training:   Learning Objective:  Patient will have a greater understanding of diabetes self-management.  Patient education plan per assessed needs and concerns is to attend individual sessions for    Education Topics Reviewed with Patient Today:    Carbohydrate counting, Reviewed blood glucose goals for pre and post meals and how to evaluate the patients' food intake on their  blood glucose level. Role of exercise on diabetes management, blood pressure control and cardiac health., Helped patient identify appropriate exercises in relation to his/her diabetes, diabetes complications and other health issue.   Purpose and frequency of SMBG.            PATIENTS GOALS/Plan (Developed by the patient):  Nutrition: Follow meal plan discussed Physical Activity: 15 minutes per day Monitoring : test my blood glucose as discussed (note x per day with comment) (test BG after walking to see benefit to BG management)  Patient Self Evaluation of Goals - Patient rates self as meeting previously set goals:   Nutrition: >75% Physical Activity: 25 - 50% Medications: >75% Monitoring: >75% Problem Solving: >75% Reducing Risk: 25 - 50% Health Coping: 25 - 50%   Plan:   Patient Instructions  Plan:  Continue aiming for 2 Carb Choices per meal (30 grams) +/- 1 either way (Starch, Fruit and Milk are Carb Choices, Veg, Meat and Fat are not.)  Consider eating 3 meals a day, even if you get your carbs from fruit and a protein like cheese or peanut butter. Continue to nclude lean protein in moderation with your meals and snacks Continue with your activity level of Arm Chair Exercises and the increased walking with your new job will be great too! Continue checking BG at alternate times per day as directed by MD        Expected Outcomes:  Demonstrated interest in learning. Expect positive outcomes  Education material provided: none today  If problems or questions, patient to contact team via:  Phone and Email  Future DSME  appointment: - 4-6 wks

## 2014-12-29 ENCOUNTER — Encounter: Payer: Medicaid Other | Attending: Internal Medicine | Admitting: *Deleted

## 2014-12-29 VITALS — Ht 61.0 in | Wt 218.4 lb

## 2014-12-29 DIAGNOSIS — Z6841 Body Mass Index (BMI) 40.0 and over, adult: Secondary | ICD-10-CM | POA: Diagnosis not present

## 2014-12-29 DIAGNOSIS — E119 Type 2 diabetes mellitus without complications: Secondary | ICD-10-CM | POA: Diagnosis present

## 2014-12-29 DIAGNOSIS — Z713 Dietary counseling and surveillance: Secondary | ICD-10-CM | POA: Insufficient documentation

## 2014-12-29 NOTE — Progress Notes (Signed)
Diabetes Self-Management Education  Visit Type:  Follow-up  Appt. Start Time: 1000 Appt. End Time: 1030  12/31/2014  Melissa Valdez, identified by name and date of birth, is a 49 y.o. female with a diagnosis of Diabetes:  .   ASSESSMENT  Height 5\' 1"  (1.549 m), weight 218 lb 6.4 oz (99.066 kg). Body mass index is 41.29 kg/(m^2).       Diabetes Self-Management Education - 12/29/14 1034    Psychosocial Assessment   Patient Belief/Attitude about Diabetes Motivated to manage diabetes   Patient Concerns Nutrition/Meal planning;Glycemic Control;Weight Control   Special Needs None   Complications   How often do you check your blood sugar? 1-2 times/day   Fasting Blood glucose range (mg/dL) 130-179   Postprandial Blood glucose range (mg/dL) 180-200   Number of hypoglycemic episodes per month 0   Exercise   Exercise Type Light (walking / raking leaves)   How many days per week to you exercise? 5   How many minutes per day do you exercise? 15   Total minutes per week of exercise 75   Patient Education   Nutrition management  Reviewed blood glucose goals for pre and post meals and how to evaluate the patients' food intake on their blood glucose level.   Physical activity and exercise  Helped patient identify appropriate exercises in relation to his/her diabetes, diabetes complications and other health issue.;Other (comment)  Discussed financial options such as Silver Sneakers   Monitoring Identified appropriate SMBG and/or A1C goals.   Individualized Goals (developed by patient)   Nutrition Follow meal plan discussed   Physical Activity 15 minutes per day   Monitoring  test blood glucose pre and post meals as discussed   Patient Self-Evaluation of Goals - Patient rates self as meeting previously set goals (% of time)   Nutrition >75%   Physical Activity 50 - 75 %   Medications >75%   Monitoring >75%   Problem Solving >75%   Reducing Risk 50 - 75 %   Health Coping 50 - 75 %   Outcomes   Program Status Not Completed   Subsequent Visit   Since your last visit, have you continued or began the use of a meal plan? Yes   How many days a week are you following a meal plan? 6  will occasionally binge eat when under more stress   Since your last visit, have you continued or began to exercise on a consistent basis? Yes  Arm Chair exercises at home. There are dogs in the neighborhood so not able to walk there anymore   How many days per week are you exercising or participating in a physicial activity for more than 20 minutes? 5   Since your last visit have you continued or begun to take your medications as prescribed? Yes   Since your last visit have you experienced any weight changes? Gain   Weight Gain (lbs) 5   Since your last visit, are you checking your blood glucose at least once a day? Yes      Learning Objective:  Patient will have a greater understanding of diabetes self-management. Patient education plan is to attend individual and/or group sessions per assessed needs and concerns.   Plan:   Patient Instructions  Plan:  Continue aiming for 2 Carb Choices per meal (30 grams) +/- 1 either way (Starch, Fruit and Milk are Carb Choices, Veg, Meat and Fat are not.)  Consider eating 3 meals a day, even if you  get your carbs from fruit and a protein like cheese or peanut butter. Continue to nclude lean protein in moderation with your meals and snacks Continue with your activity level of Arm Chair Exercises  Check into Silver Sneakers at [facility] or other gyms to see if you're eligible? Continue checking BG at alternate times per day as directed by MD         Expected Outcomes:  Demonstrated interest in learning. Expect positive outcomes  Education material provided: Support group flyer, Arm Chair Exercise handout  If problems or questions, patient to contact team via:  Phone and Email  Future DSME appointment: - 3-4 months

## 2014-12-29 NOTE — Patient Instructions (Signed)
Plan:  Continue aiming for 2 Carb Choices per meal (30 grams) +/- 1 either way (Starch, Fruit and Milk are Carb Choices, Veg, Meat and Fat are not.)  Consider eating 3 meals a day, even if you get your carbs from fruit and a protein like cheese or peanut butter. Continue to nclude lean protein in moderation with your meals and snacks Continue with your activity level of Arm Chair Exercises  Check into Silver Sneakers at [facility] or other gyms to see if you're eligible? Continue checking BG at alternate times per day as directed by MD

## 2014-12-31 ENCOUNTER — Encounter: Payer: Self-pay | Admitting: *Deleted

## 2015-01-24 ENCOUNTER — Other Ambulatory Visit: Payer: Self-pay

## 2015-01-24 DIAGNOSIS — Z1231 Encounter for screening mammogram for malignant neoplasm of breast: Secondary | ICD-10-CM

## 2015-02-08 ENCOUNTER — Ambulatory Visit
Admission: RE | Admit: 2015-02-08 | Discharge: 2015-02-08 | Disposition: A | Discharge status: Home / Self Care | Payer: Medicaid Other | Source: Ambulatory Visit

## 2015-02-08 DIAGNOSIS — Z1231 Encounter for screening mammogram for malignant neoplasm of breast: Secondary | ICD-10-CM

## 2015-03-30 ENCOUNTER — Encounter: Payer: Medicaid Other | Attending: Internal Medicine | Admitting: *Deleted

## 2015-03-30 ENCOUNTER — Encounter: Payer: Self-pay | Admitting: *Deleted

## 2015-03-30 VITALS — Ht 61.0 in | Wt 208.1 lb

## 2015-03-30 DIAGNOSIS — E119 Type 2 diabetes mellitus without complications: Secondary | ICD-10-CM | POA: Diagnosis present

## 2015-03-30 DIAGNOSIS — Z6841 Body Mass Index (BMI) 40.0 and over, adult: Secondary | ICD-10-CM | POA: Diagnosis not present

## 2015-03-30 DIAGNOSIS — Z713 Dietary counseling and surveillance: Secondary | ICD-10-CM | POA: Diagnosis not present

## 2015-03-30 NOTE — Patient Instructions (Signed)
Plan:  Continue aiming for 2 Carb Choices per meal (30 grams) +/- 1 either way (Starch, Fruit and Milk are Carb Choices, Veg, Meat and Fat are not.)  Consider eating 3 meals a day, even if you get your carbs from fruit and a protein like cheese or peanut butter. Continue to nclude lean protein in moderation with your meals and snacks Continue with your activity level of Arm Chair Exercises on days you don't go to the gym Continue going to [employer] 2-3 days a week for an hour or more Continue checking BG at alternate times per day as directed by MD

## 2015-03-30 NOTE — Progress Notes (Signed)
Diabetes Self-Management Education  Visit Type:  Follow-up  Appt. Start Time: 0930 Appt. End Time: 1000  03/30/2015  [name], identified by name and date of birth, is a 49 y.o. female with a diagnosis of Diabetes:  .   ASSESSMENT  There were no vitals taken for this visit. There is no weight on file to calculate BMI.       Diabetes Self-Management Education - 03/30/15 0937    Health Coping   How would you rate your overall health? Good   Psychosocial Assessment   Patient Belief/Attitude about Diabetes Motivated to manage diabetes   Self-care barriers None   Self-management support Doctor's office;CDE visits   Patient Concerns Glycemic Control;Weight Control   Special Needs None   Complications   Last HgB A1C per patient/outside source --  Will probably be checked in January, 2017   How often do you check your blood sugar? 1-2 times/day   Fasting Blood glucose range (mg/dL) 130-179   Postprandial Blood glucose range (mg/dL) 130-179   Number of hypoglycemic episodes per month 0   Are you checking your feet? Yes   How many days per week are you checking your feet? 3   Exercise   Exercise Type Light (walking / raking leaves)   How many days per week to you exercise? 3   How many minutes per day do you exercise? 75   Total minutes per week of exercise 225   Patient Education   Nutrition management  Carbohydrate counting  she has increased her veg intake and decreased her starch intake   Physical activity and exercise  Role of exercise on diabetes management, blood pressure control and cardiac health.   Monitoring Identified appropriate SMBG and/or A1C goals.   Individualized Goals (developed by patient)   Nutrition Follow meal plan discussed   Physical Activity Exercise 3-5 times per week   Medications take my medication as prescribed   Monitoring  test blood glucose pre and post meals as discussed   Patient Self-Evaluation of Goals - Patient rates self as  meeting previously set goals (% of time)   Nutrition >75%   Physical Activity >75%   Medications >75%   Monitoring >75%   Problem Solving >75%   Reducing Risk >75%   Health Coping >75%   Outcomes   Program Status Not Completed   Subsequent Visit   Since your last visit, have you continued or began the use of a meal plan? Yes   How many days a week are you following a meal plan? 7   Since your last visit, have you continued or began to exercise on a consistent basis? Yes   How many days per week are you exercising or participating in a physicial activity for more than 20 minutes? 2   Since your last visit have you continued or begun to take your medications as prescribed? Yes   Since your last visit have you experienced any weight changes? Loss   Weight Loss (lbs) 10   Since your last visit, are you checking your blood glucose at least once a day? Yes      Learning Objective:  Patient will have a greater understanding of diabetes self-management. Patient education plan is to attend individual and/or group sessions per assessed needs and concerns.   Plan:   Patient Instructions  Plan:  Continue aiming for 2 Carb Choices per meal (30 grams) +/- 1 either way (Starch, Fruit and Milk are Carb Choices, Veg, Meat  and Fat are not.)  Consider eating 3 meals a day, even if you get your carbs from fruit and a protein like cheese or peanut butter. Continue to nclude lean protein in moderation with your meals and snacks Continue with your activity level of Arm Chair Exercises on days you don't go to the gym Continue going to [employer] 2-3 days a week for an hour or more Continue checking BG at alternate times per day as directed by MD        Expected Outcomes:  Demonstrated interest in learning. Expect positive outcomes  Education material provided: A1c Handout to demonstrate her average BG readings to expected A1c at next MD visit  If problems or questions, patient to contact  team via:  Phone and Email  Future DSME appointment: - 3-4 months

## 2015-05-23 ENCOUNTER — Other Ambulatory Visit: Payer: Self-pay | Admitting: Surgery

## 2015-05-23 DIAGNOSIS — E042 Nontoxic multinodular goiter: Secondary | ICD-10-CM

## 2015-05-26 ENCOUNTER — Other Ambulatory Visit: Payer: Medicaid Other

## 2015-06-02 ENCOUNTER — Other Ambulatory Visit: Payer: Medicaid Other

## 2015-06-29 ENCOUNTER — Ambulatory Visit: Payer: Medicaid Other | Admitting: *Deleted

## 2015-07-12 ENCOUNTER — Emergency Department (HOSPITAL_COMMUNITY)
Admission: EM | Admit: 2015-07-12 | Discharge: 2015-07-12 | Disposition: A | Discharge status: Home / Self Care | Payer: Medicaid Other | Source: Home / Self Care | Attending: Family Medicine | Admitting: Family Medicine

## 2015-07-12 ENCOUNTER — Encounter (HOSPITAL_COMMUNITY): Payer: Self-pay | Admitting: Emergency Medicine

## 2015-07-12 DIAGNOSIS — N390 Urinary tract infection, site not specified: Secondary | ICD-10-CM

## 2015-07-12 LAB — POCT URINALYSIS DIP (DEVICE)
Glucose, UA: NEGATIVE mg/dL
KETONES UR: NEGATIVE mg/dL
Nitrite: NEGATIVE
PH: 6 (ref 5.0–8.0)
PROTEIN: 100 mg/dL — AB
Urobilinogen, UA: 0.2 mg/dL (ref 0.0–1.0)

## 2015-07-12 MED ORDER — CEPHALEXIN 500 MG PO CAPS: 500.0000 mg | ORAL_CAPSULE | Freq: Three times a day (TID) | ORAL | Status: DC

## 2015-07-12 MED ORDER — PHENAZOPYRIDINE HCL 200 MG PO TABS: 200.0000 mg | ORAL_TABLET | Freq: Three times a day (TID) | ORAL | Status: DC

## 2015-07-12 NOTE — Discharge Instructions (Signed)

## 2015-07-12 NOTE — ED Notes (Signed)
The patient presented to the [facility] with a complaint of urinary urgency and dysuria x 2 days.

## 2015-07-13 ENCOUNTER — Encounter: Payer: Medicaid Other | Attending: Internal Medicine | Admitting: *Deleted

## 2015-07-13 ENCOUNTER — Encounter: Payer: Self-pay | Admitting: *Deleted

## 2015-07-13 VITALS — Ht 60.0 in | Wt 210.2 lb

## 2015-07-13 DIAGNOSIS — E119 Type 2 diabetes mellitus without complications: Secondary | ICD-10-CM

## 2015-07-13 DIAGNOSIS — Z029 Encounter for administrative examinations, unspecified: Secondary | ICD-10-CM | POA: Insufficient documentation

## 2015-07-13 NOTE — Progress Notes (Signed)
Diabetes Self-Management Education  Visit Type:  Follow-up  Appt. Start Time: 1130 Appt. End Time: 1200  07/13/2015  Melissa Valdez, identified by name and date of birth, is a 50 y.o. female with a diagnosis of Diabetes:  .   ASSESSMENT  Height 5' (1.524 m), weight 210 lb 3.2 oz (95.346 kg). Body mass index is 41.05 kg/(m^2).       Diabetes Self-Management Education - 07/13/15 1142    Psychosocial Assessment   Patient Belief/Attitude about Diabetes Motivated to manage diabetes   Self-care barriers None   Self-management support Doctor's office;Family;CDE visits   Patient Concerns Nutrition/Meal planning;Glycemic Control   Special Needs None   Complications   How often do you check your blood sugar? 1-2 times/day   Fasting Blood glucose range (mg/dL) 130-179   Postprandial Blood glucose range (mg/dL) 180-200   Number of hypoglycemic episodes per month 0   Dietary Intake   Breakfast Cheerios with banana and whole milk   Snack (morning) no   Lunch tuna sandwich with small handful of chips   Snack (afternoon) no   Dinner baked meat, vegetables, occasionally a starch   Snack (evening) no   Beverage(s) fruit juice, water,   Exercise   Exercise Type Light (walking / raking leaves)   How many days per week to you exercise? 2   How many minutes per day do you exercise? 60   Total minutes per week of exercise 120   Patient Education   Nutrition management  Carbohydrate counting   Physical activity and exercise  Role of exercise on diabetes management, blood pressure control and cardiac health.   Monitoring Identified appropriate SMBG and/or A1C goals.   Individualized Goals (developed by patient)   Nutrition Follow meal plan discussed   Physical Activity Exercise 1-2 times per week   Medications take my medication as prescribed   Monitoring  test blood glucose pre and post meals as discussed   Patient Self-Evaluation of Goals - Patient rates self as meeting previously set  goals (% of time)   Nutrition >75%   Physical Activity >75%   Medications >75%   Monitoring >75%   Problem Solving >75%   Reducing Risk >75%   Health Coping >75%   Outcomes   Program Status Completed   Subsequent Visit   Since your last visit have you continued or begun to take your medications as prescribed? Yes   Since your last visit have you experienced any weight changes? No change   Since your last visit, are you checking your blood glucose at least once a day? Yes      Learning Objective:  Patient will have a greater understanding of diabetes self-management. Patient education plan is to attend individual and/or group sessions per assessed needs and concerns.   Plan:   Patient Instructions  Plan:  Continue aiming for 2-3 Carb Choices per meal (30-45 grams)  (Starch, Fruit and Milk are Carb Choices, Veg, Meat and Fat are not.)  Continue eating 3 meals a day, even if you get your carbs from fruit and a protein like cheese or peanut butter. Continue to nclude lean protein in moderation with your meals and snacks Continue with your activity level of Arm Chair Exercises on days you don't go to the gym Continue going to [employer] 2-3 days a week for an hour or more Continue checking BG at alternate times per day, consider checking after exercise occasionally just to see the result.  Expected Outcomes:  Demonstrated interest in learning. Expect positive outcomes  Education material provided: Food label handouts  If problems or questions, patient to contact team via:  Phone and Email  Future DSME appointment: - 3-4 months

## 2015-07-13 NOTE — ED Provider Notes (Signed)
CSN: [id number]     Arrival date & time 07/12/15  1742 History   First MD Initiated Contact with Patient 07/12/15 1953     Chief Complaint  Patient presents with  . Urinary Urgency  . Dysuria   (Consider location/radiation/quality/duration/timing/severity/associated sxs/prior Treatment) HPI Dysuria for 2 days. No home treatment, no visible blood. Previous UTI. Thinks it is because she drinks lots of soda. No fever, vomiting, vaginal discharge.  Past Medical History  Diagnosis Date  . Hypertension   . Sleep apnea     cpap-   . Diabetes mellitus without complication ([place])   . GERD (gastroesophageal reflux disease)   . Obesity, Class III, BMI 40-49.9 (morbid obesity) ([place])    Past Surgical History  Procedure Laterality Date  . Tubal ligation  1999  . Boil on leg removed   1980s   Family History  Problem Relation Age of Onset  . Hypertension Mother   . Diabetes Father    Social History  Substance Use Topics  . Smoking status: Current Every Day Smoker -- 0.25 packs/day    Types: Cigarettes    Last Attempt to Quit: 12/29/2013  . Smokeless tobacco: Never Used  . Alcohol Use: 1.8 oz/week    2 Cans of beer, 1 Shots of liquor per week   OB History    Gravida Para Term Preterm AB TAB SAB Ectopic Multiple Living   2 2 2       2      Review of Systems Dysuria  Allergies  Review of patient's allergies indicates no known allergies.  Home Medications   Prior to Admission medications   Medication Sig Start Date End Date Taking? Authorizing Provider  cyclobenzaprine (FLEXERIL) 10 MG tablet Take 10 mg by mouth 3 (three) times daily as needed for muscle spasms.   Yes Historical Provider, MD  Dulaglutide (TRULICITY) A999333 [id number] SOPN Inject 1.5 mg into the skin daily.    Yes Historical Provider, MD  losartan-hydrochlorothiazide (HYZAAR) 50-12.5 MG tablet Take 1 tablet by mouth daily.   Yes Historical Provider, MD  metFORMIN (GLUCOPHAGE) 500 MG tablet Take 500 mg by mouth 2 (two)  times daily with a meal.   Yes Historical Provider, MD  omeprazole (PRILOSEC) 20 MG capsule Take 20 mg by mouth daily.   Yes Historical Provider, MD  simvastatin (ZOCOR) 10 MG tablet Take 10 mg by mouth daily.   Yes Historical Provider, MD  sitaGLIPtin (JANUVIA) 100 MG tablet Take 100 mg by mouth daily.   Yes Historical Provider, MD  ACCU-CHEK AVIVA PLUS test strip  04/26/14   Historical Provider, MD  cephALEXin (KEFLEX) 500 MG capsule Take 1 capsule (500 mg total) by mouth 3 (three) times daily. 07/12/15   Konrad Felix, PA  cephALEXin (KEFLEX) 500 MG capsule Take 1 capsule (500 mg total) by mouth 3 (three) times daily. 07/12/15   Konrad Felix, PA  diclofenac (VOLTAREN) 50 MG EC tablet Take 50 mg by mouth 2 (two) times daily.    Historical Provider, MD  ergocalciferol (VITAMIN D2) 50000 UNITS capsule Take 50,000 Units by mouth once a week.    Historical Provider, MD  hydrochlorothiazide (HYDRODIURIL) 25 MG tablet Take 25 mg by mouth every morning.    Historical Provider, MD  norethindrone (ORTHO MICRONOR) 0.35 MG tablet Take 1 tablet (0.35 mg total) by mouth daily. Patient not taking: Reported on 06/09/2014 02/24/14   Lahoma Crocker, MD  phenazopyridine (PYRIDIUM) 200 MG tablet Take 1 tablet (200 mg total) by mouth  3 (three) times daily. 07/12/15   Konrad Felix, PA   Meds Ordered and Administered this Visit  Medications - No data to display  BP 135/73 mmHg  Pulse 77  Temp(Src) 98.4 F (36.9 C) (Oral)  Resp 16  SpO2 100% No data found.   Physical Exam NURSES NOTES AND VITAL SIGNS REVIEWED. CONSTITUTIONAL: Well developed, well nourished, no acute distress HEENT: normocephalic, atraumatic, right and left TM's are normal EYES: Conjunctiva normal NECK:normal ROM, supple, no adenopathy PULMONARY:No respiratory distress, normal effort, Lungs: CTAb/l, no wheezes, or increased work of breathing CARDIOVASCULAR: RRR, no murmur ABDOMEN: soft, ND, NT, +'ve BS, No CVA-T MUSCULOSKELETAL:  Normal ROM of all extremities,  SKIN: warm and dry without rash PSYCHIATRIC: Mood and affect, behavior are normal  ED Course  Procedures (including critical care time)  Labs Review Labs Reviewed  POCT URINALYSIS DIP (DEVICE) - Abnormal; Notable for the following:    Bilirubin Urine SMALL (*)    Hgb urine dipstick MODERATE (*)    Protein, ur 100 (*)    Leukocytes, UA TRACE (*)    All other components within normal limits    Imaging Review No results found.   Visual Acuity Review  Right Eye Distance:   Left Eye Distance:   Bilateral Distance:    Right Eye Near:   Left Eye Near:    Bilateral Near:       RX Keflex  MDM   1. UTI (lower urinary tract infection)      Patient is reassured that there are no issues that require transfer to higher level of care at this time or additional tests. Patient is advised to continue home symptomatic treatment. Patient is advised that if there are new or worsening symptoms to attend the emergency department, contact primary care provider, or return to UC. Instructions of care provided discharged home in stable condition. Return to work/school note provided.   THIS NOTE WAS GENERATED USING A VOICE RECOGNITION SOFTWARE PROGRAM. ALL REASONABLE EFFORTS  WERE MADE TO PROOFREAD THIS DOCUMENT FOR ACCURACY.  I have verbally reviewed the discharge instructions with the patient. A printed AVS was given to the patient.  All questions were answered prior to discharge.      Konrad Felix, [place] 07/13/15 [phone]

## 2015-07-13 NOTE — Patient Instructions (Addendum)
Plan:  Continue aiming for 2-3 Carb Choices per meal (30-45 grams)  (Starch, Fruit and Milk are Carb Choices, Veg, Meat and Fat are not.)  Continue eating 3 meals a day, even if you get your carbs from fruit and a protein like cheese or peanut butter. Continue to nclude lean protein in moderation with your meals and snacks Continue with your activity level of Arm Chair Exercises on days you don't go to the gym Continue going to [employer] 2-3 days a week for an hour or more Continue checking BG at alternate times per day, consider checking after exercise occasionally just to see the result.

## 2015-07-19 ENCOUNTER — Encounter (HOSPITAL_COMMUNITY): Payer: Self-pay

## 2015-07-19 DIAGNOSIS — E669 Obesity, unspecified: Secondary | ICD-10-CM | POA: Insufficient documentation

## 2015-07-19 DIAGNOSIS — Z9981 Dependence on supplemental oxygen: Secondary | ICD-10-CM | POA: Diagnosis not present

## 2015-07-19 DIAGNOSIS — Z7984 Long term (current) use of oral hypoglycemic drugs: Secondary | ICD-10-CM | POA: Diagnosis not present

## 2015-07-19 DIAGNOSIS — I10 Essential (primary) hypertension: Secondary | ICD-10-CM | POA: Diagnosis not present

## 2015-07-19 DIAGNOSIS — R110 Nausea: Secondary | ICD-10-CM | POA: Insufficient documentation

## 2015-07-19 DIAGNOSIS — E119 Type 2 diabetes mellitus without complications: Secondary | ICD-10-CM | POA: Diagnosis not present

## 2015-07-19 DIAGNOSIS — K219 Gastro-esophageal reflux disease without esophagitis: Secondary | ICD-10-CM | POA: Insufficient documentation

## 2015-07-19 DIAGNOSIS — N390 Urinary tract infection, site not specified: Secondary | ICD-10-CM | POA: Insufficient documentation

## 2015-07-19 DIAGNOSIS — Z792 Long term (current) use of antibiotics: Secondary | ICD-10-CM | POA: Diagnosis not present

## 2015-07-19 DIAGNOSIS — R300 Dysuria: Secondary | ICD-10-CM | POA: Diagnosis present

## 2015-07-19 DIAGNOSIS — Z791 Long term (current) use of non-steroidal anti-inflammatories (NSAID): Secondary | ICD-10-CM | POA: Insufficient documentation

## 2015-07-19 DIAGNOSIS — G4730 Sleep apnea, unspecified: Secondary | ICD-10-CM | POA: Insufficient documentation

## 2015-07-19 DIAGNOSIS — F17210 Nicotine dependence, cigarettes, uncomplicated: Secondary | ICD-10-CM | POA: Diagnosis not present

## 2015-07-19 DIAGNOSIS — Z79899 Other long term (current) drug therapy: Secondary | ICD-10-CM | POA: Insufficient documentation

## 2015-07-19 LAB — URINE MICROSCOPIC-ADD ON

## 2015-07-19 LAB — URINALYSIS, ROUTINE W REFLEX MICROSCOPIC
BILIRUBIN URINE: NEGATIVE
GLUCOSE, UA: NEGATIVE mg/dL
Ketones, ur: NEGATIVE mg/dL
Nitrite: NEGATIVE
PROTEIN: 100 mg/dL — AB
Specific Gravity, Urine: 1.025 (ref 1.005–1.030)
pH: 8 (ref 5.0–8.0)

## 2015-07-19 NOTE — ED Notes (Signed)
Pt seen at urgent care 07-12-15 dx: UTI, still taking Cephalexin.  Pt still with c/o left flank pain and dysuria.  No fever.

## 2015-07-20 ENCOUNTER — Emergency Department (HOSPITAL_COMMUNITY)
Admission: EM | Admit: 2015-07-20 | Discharge: 2015-07-20 | Disposition: A | Discharge status: Home / Self Care | Payer: Medicaid Other | Attending: Emergency Medicine | Admitting: Emergency Medicine

## 2015-07-20 DIAGNOSIS — N390 Urinary tract infection, site not specified: Secondary | ICD-10-CM

## 2015-07-20 LAB — CBG MONITORING, ED: GLUCOSE-CAPILLARY: 85 mg/dL (ref 65–99)

## 2015-07-20 MED ORDER — PHENAZOPYRIDINE HCL 200 MG PO TABS: 200.0000 mg | ORAL_TABLET | Freq: Three times a day (TID) | ORAL | Status: DC

## 2015-07-20 MED ORDER — CIPROFLOXACIN HCL 500 MG PO TABS
500.0000 mg | ORAL_TABLET | Freq: Once | ORAL | Status: AC
  Administered 2015-07-20: 500 mg via ORAL
  Filled 2015-07-20: qty 1

## 2015-07-20 MED ORDER — CIPROFLOXACIN HCL 500 MG PO TABS: 500.0000 mg | ORAL_TABLET | Freq: Two times a day (BID) | ORAL | Status: DC

## 2015-07-20 MED ORDER — PHENAZOPYRIDINE HCL 100 MG PO TABS
100.0000 mg | ORAL_TABLET | Freq: Once | ORAL | Status: AC
  Administered 2015-07-20: 100 mg via ORAL
  Filled 2015-07-20: qty 1

## 2015-07-20 NOTE — ED Provider Notes (Signed)
CSN: [id number]     Arrival date & time 07/19/15  2100 History  By signing my name below, I, Altamease Oiler, attest that this documentation has been prepared under the direction and in the presence of Tanna Furry, MD. Electronically Signed: Altamease Oiler, ED Scribe. 07/20/2015. 2:28 AM   Chief Complaint  Patient presents with  . Urinary Tract Infection  . Flank Pain   The history is provided by the patient. No language interpreter was used.   Melissa Valdez is a 50 y.o. female with history of DM who presents to the Emergency Department complaining of ongoing dysuria with onset 11 days ago. She was seen at Urgent Care on 07/12/15 for similar symptoms where she was diagnosed with a UTI and started on Pyridium and Cephalexin. She had some relief but her symptoms have returned. Associated symptoms include sweating, nausea (none at present), and an episode of flank pain earlier. Pt denies vomiting, hematuria, and abdominal pain. Her blood sugar was 140 yesterday.   Past Medical History  Diagnosis Date  . Hypertension   . Sleep apnea     cpap-   . Diabetes mellitus without complication ([place])   . GERD (gastroesophageal reflux disease)   . Obesity, Class III, BMI 40-49.9 (morbid obesity) ([place])    Past Surgical History  Procedure Laterality Date  . Tubal ligation  1999  . Boil on leg removed   1980s   Family History  Problem Relation Age of Onset  . Hypertension Mother   . Diabetes Father    Social History  Substance Use Topics  . Smoking status: Current Every Day Smoker -- 0.25 packs/day    Types: Cigarettes    Last Attempt to Quit: 12/29/2013  . Smokeless tobacco: Never Used  . Alcohol Use: 1.8 oz/week    2 Cans of beer, 1 Shots of liquor per week   OB History    Gravida Para Term Preterm AB TAB SAB Ectopic Multiple Living   2 2 2       2      Review of Systems  Constitutional: Negative for fever, chills, diaphoresis, appetite change and fatigue.  HENT: Negative for mouth  sores, sore throat and trouble swallowing.   Eyes: Negative for visual disturbance.  Respiratory: Negative for cough, chest tightness, shortness of breath and wheezing.   Cardiovascular: Negative for chest pain.  Gastrointestinal: Positive for nausea. Negative for vomiting, abdominal pain, diarrhea and abdominal distention.  Endocrine: Negative for polydipsia, polyphagia and polyuria.  Genitourinary: Positive for dysuria and flank pain. Negative for frequency and hematuria.  Musculoskeletal: Negative for gait problem.  Skin: Negative for color change, pallor and rash.  Neurological: Negative for dizziness, syncope, light-headedness and headaches.  Hematological: Does not bruise/bleed easily.  Psychiatric/Behavioral: Negative for behavioral problems and confusion.      Allergies  Review of patient's allergies indicates no known allergies.  Home Medications   Prior to Admission medications   Medication Sig Start Date End Date Taking? Authorizing Provider  ACCU-CHEK AVIVA PLUS test strip  04/26/14   Historical Provider, MD  cephALEXin (KEFLEX) 500 MG capsule Take 1 capsule (500 mg total) by mouth 3 (three) times daily. 07/12/15   Konrad Felix, PA  cephALEXin (KEFLEX) 500 MG capsule Take 1 capsule (500 mg total) by mouth 3 (three) times daily. 07/12/15   Konrad Felix, PA  ciprofloxacin (CIPRO) 500 MG tablet Take 1 tablet (500 mg total) by mouth every 12 (twelve) hours. 07/20/15   Tanna Furry, MD  cyclobenzaprine (FLEXERIL) 10 MG tablet Take 10 mg by mouth 3 (three) times daily as needed for muscle spasms.    Historical Provider, MD  diclofenac (VOLTAREN) 50 MG EC tablet Take 50 mg by mouth 2 (two) times daily.    Historical Provider, MD  Dulaglutide (TRULICITY) A999333 0000000 SOPN Inject 1.5 mg into the skin daily.     Historical Provider, MD  ergocalciferol (VITAMIN D2) 50000 UNITS capsule Take 50,000 Units by mouth once a week.    Historical Provider, MD  hydrochlorothiazide (HYDRODIURIL)  25 MG tablet Take 25 mg by mouth every morning.    Historical Provider, MD  losartan-hydrochlorothiazide (HYZAAR) 50-12.5 MG tablet Take 1 tablet by mouth daily.    Historical Provider, MD  metFORMIN (GLUCOPHAGE) 500 MG tablet Take 500 mg by mouth 2 (two) times daily with a meal.    Historical Provider, MD  norethindrone (ORTHO MICRONOR) 0.35 MG tablet Take 1 tablet (0.35 mg total) by mouth daily. Patient not taking: Reported on 06/09/2014 02/24/14   Lahoma Crocker, MD  omeprazole (PRILOSEC) 20 MG capsule Take 20 mg by mouth daily.    Historical Provider, MD  phenazopyridine (PYRIDIUM) 200 MG tablet Take 1 tablet (200 mg total) by mouth 3 (three) times daily. 07/20/15   Tanna Furry, MD  simvastatin (ZOCOR) 10 MG tablet Take 10 mg by mouth daily.    Historical Provider, MD  sitaGLIPtin (JANUVIA) 100 MG tablet Take 100 mg by mouth daily.    Historical Provider, MD   BP 137/93 mmHg  Pulse 82  Temp(Src) 98.9 F (37.2 C) (Oral)  Resp 16  Ht 5' (1.524 m)  Wt 213 lb (96.616 kg)  BMI 41.60 kg/m2  SpO2 98%  LMP 07/02/2015 Physical Exam  Constitutional: She is oriented to person, place, and time. She appears well-developed and well-nourished. No distress.  HENT:  Head: Normocephalic.  Eyes: Conjunctivae are normal. Pupils are equal, round, and reactive to light. No scleral icterus.  Neck: Normal range of motion. Neck supple. No thyromegaly present.  Cardiovascular: Normal rate and regular rhythm.  Exam reveals no gallop and no friction rub.   No murmur heard. Pulmonary/Chest: Effort normal and breath sounds normal. No respiratory distress. She has no wheezes. She has no rales.  Abdominal: Soft. Bowel sounds are normal. She exhibits no distension. There is no tenderness. There is no rebound.  Musculoskeletal: Normal range of motion.  Neurological: She is alert and oriented to person, place, and time.  Skin: Skin is warm and dry. No rash noted.  Psychiatric: She has a normal mood and affect. Her  behavior is normal.    ED Course  Procedures (including critical care time) DIAGNOSTIC STUDIES: Oxygen Saturation is 98% on RA,  normal by my interpretation.    COORDINATION OF CARE: 2:26 AM Discussed treatment plan which includes lab work and abx with pt at bedside and pt agreed to plan.  Labs Review Labs Reviewed  URINALYSIS, ROUTINE W REFLEX MICROSCOPIC (NOT AT [facility]) - Abnormal; Notable for the following:    Color, Urine AMBER (*)    APPearance TURBID (*)    Hgb urine dipstick MODERATE (*)    Protein, ur 100 (*)    Leukocytes, UA LARGE (*)    All other components within normal limits  URINE MICROSCOPIC-ADD ON - Abnormal; Notable for the following:    Squamous Epithelial / LPF 0-5 (*)    Bacteria, UA RARE (*)    All other components within normal limits  URINE CULTURE  CBG  MONITORING, ED    Imaging Review No results found. I have personally reviewed and evaluated these lab results as part of my medical decision-making.   EKG Interpretation None      MDM   Final diagnoses:  UTI (lower urinary tract infection)   Clinically not Pyelo. Afebrile.  No back pain or tenderness in the abdomen. Plan Cipro. Likely cephalexin resistant organism. Have obtained culture. Will follow-up.  I personally performed the services described in this documentation, which was scribed in my presence. The recorded information has been reviewed and is accurate.    Tanna Furry, MD 07/20/15 0300

## 2015-07-20 NOTE — ED Notes (Signed)
CBG 85- Callie RN Aware

## 2015-07-20 NOTE — ED Notes (Signed)
Family at bedside. 

## 2015-07-20 NOTE — Discharge Instructions (Signed)

## 2015-07-21 LAB — URINE CULTURE

## 2015-07-28 ENCOUNTER — Telehealth: Payer: Self-pay | Admitting: *Deleted

## 2015-07-28 NOTE — Telephone Encounter (Signed)
Patient called to report her most recent A1c of 6.3%

## 2015-10-13 ENCOUNTER — Ambulatory Visit: Payer: Medicaid Other | Admitting: *Deleted

## 2016-01-16 ENCOUNTER — Other Ambulatory Visit: Payer: Self-pay | Admitting: Internal Medicine

## 2016-01-16 DIAGNOSIS — Z1231 Encounter for screening mammogram for malignant neoplasm of breast: Secondary | ICD-10-CM

## 2016-02-10 ENCOUNTER — Ambulatory Visit
Admission: RE | Admit: 2016-02-10 | Discharge: 2016-02-10 | Disposition: A | Discharge status: Home / Self Care | Payer: Medicaid Other | Source: Ambulatory Visit | Attending: Internal Medicine | Admitting: Internal Medicine

## 2016-02-10 DIAGNOSIS — Z1231 Encounter for screening mammogram for malignant neoplasm of breast: Secondary | ICD-10-CM

## 2016-07-10 IMAGING — DX DG CHEST 2V
2 series · 2 of 2 positions shown · non-contrast
Comparison: 03/04/2014

CLINICAL DATA: dizzyness and emesis since around noon. Hx:DM, CBG
500+

EXAM:
CHEST  2 VIEW

[w chest pa]
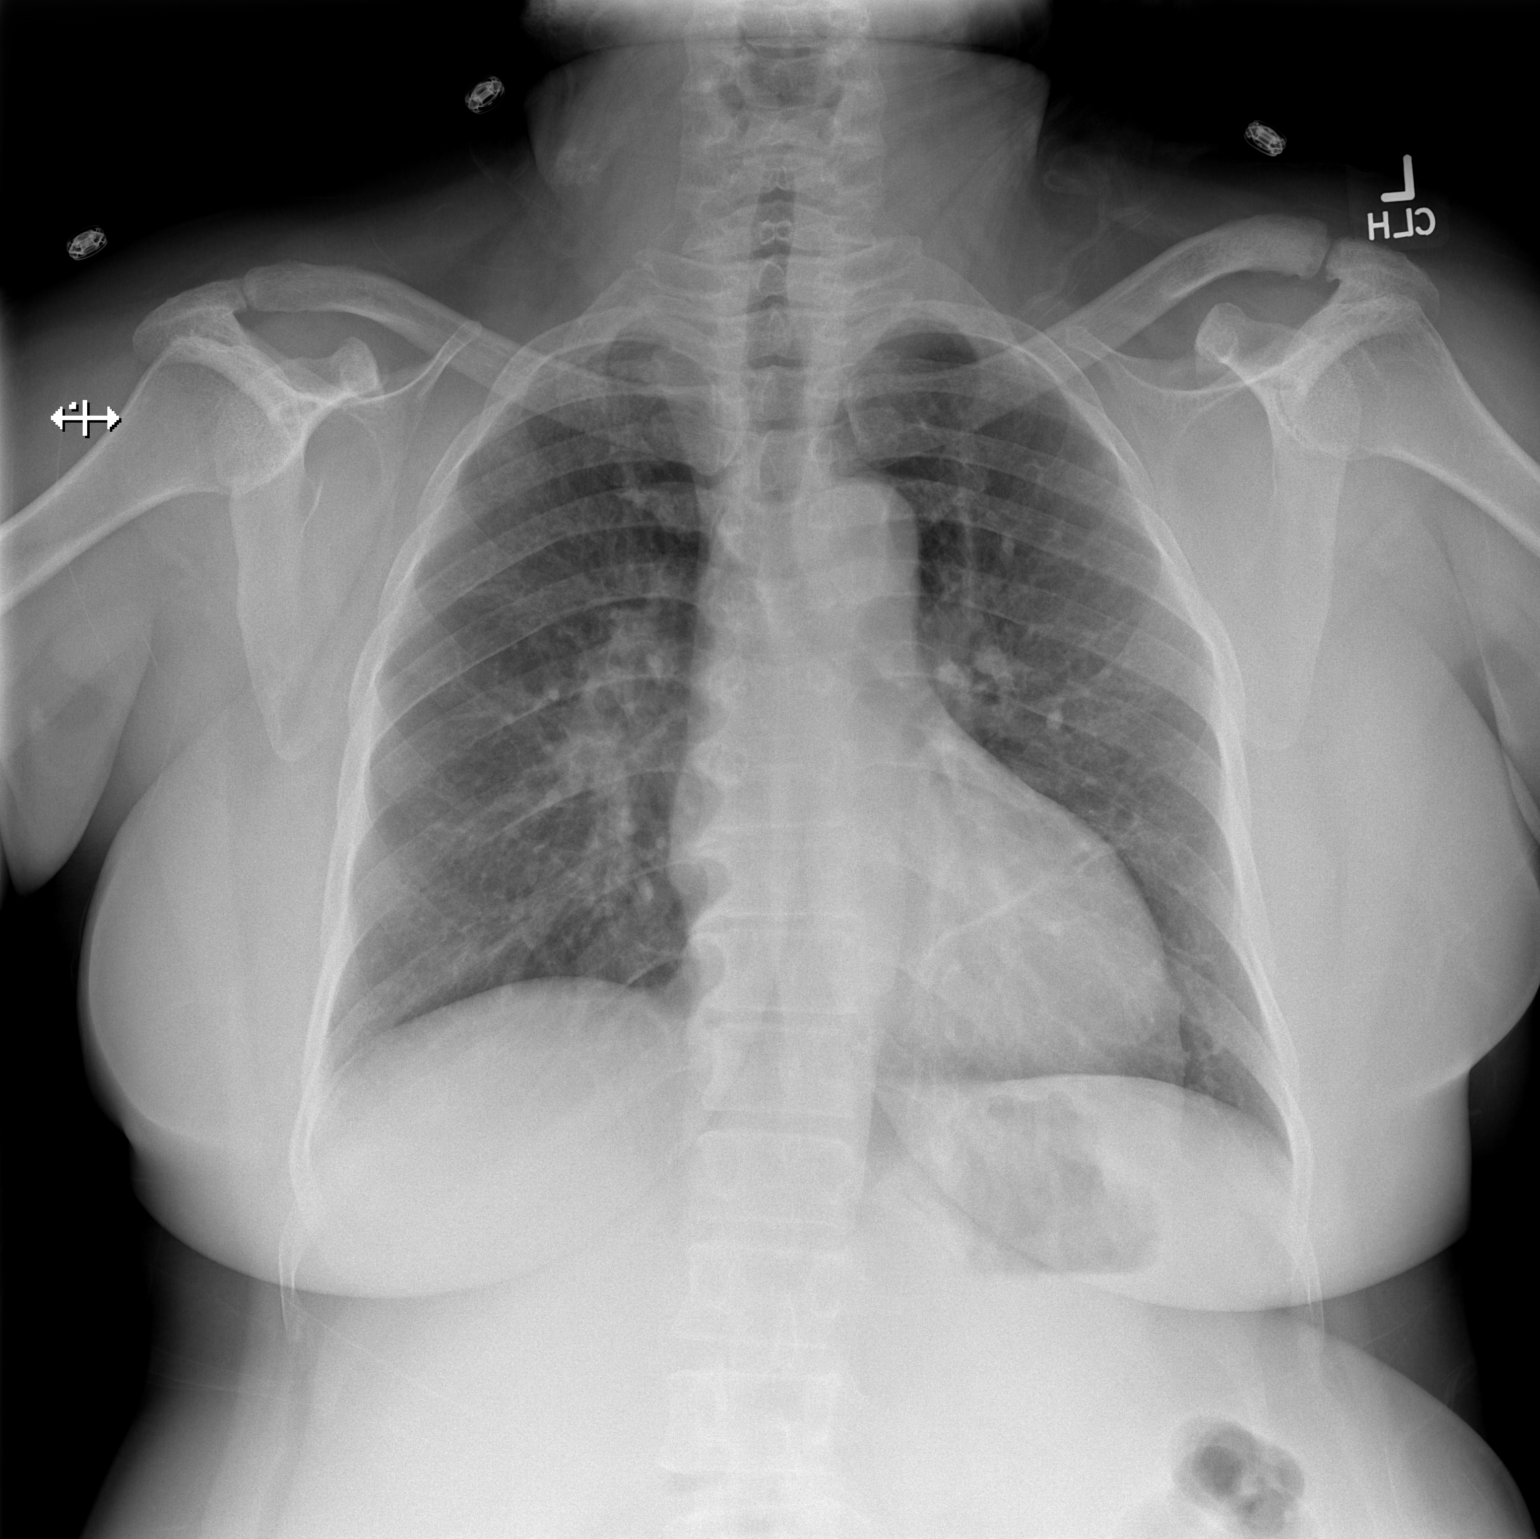

[w chest lat]
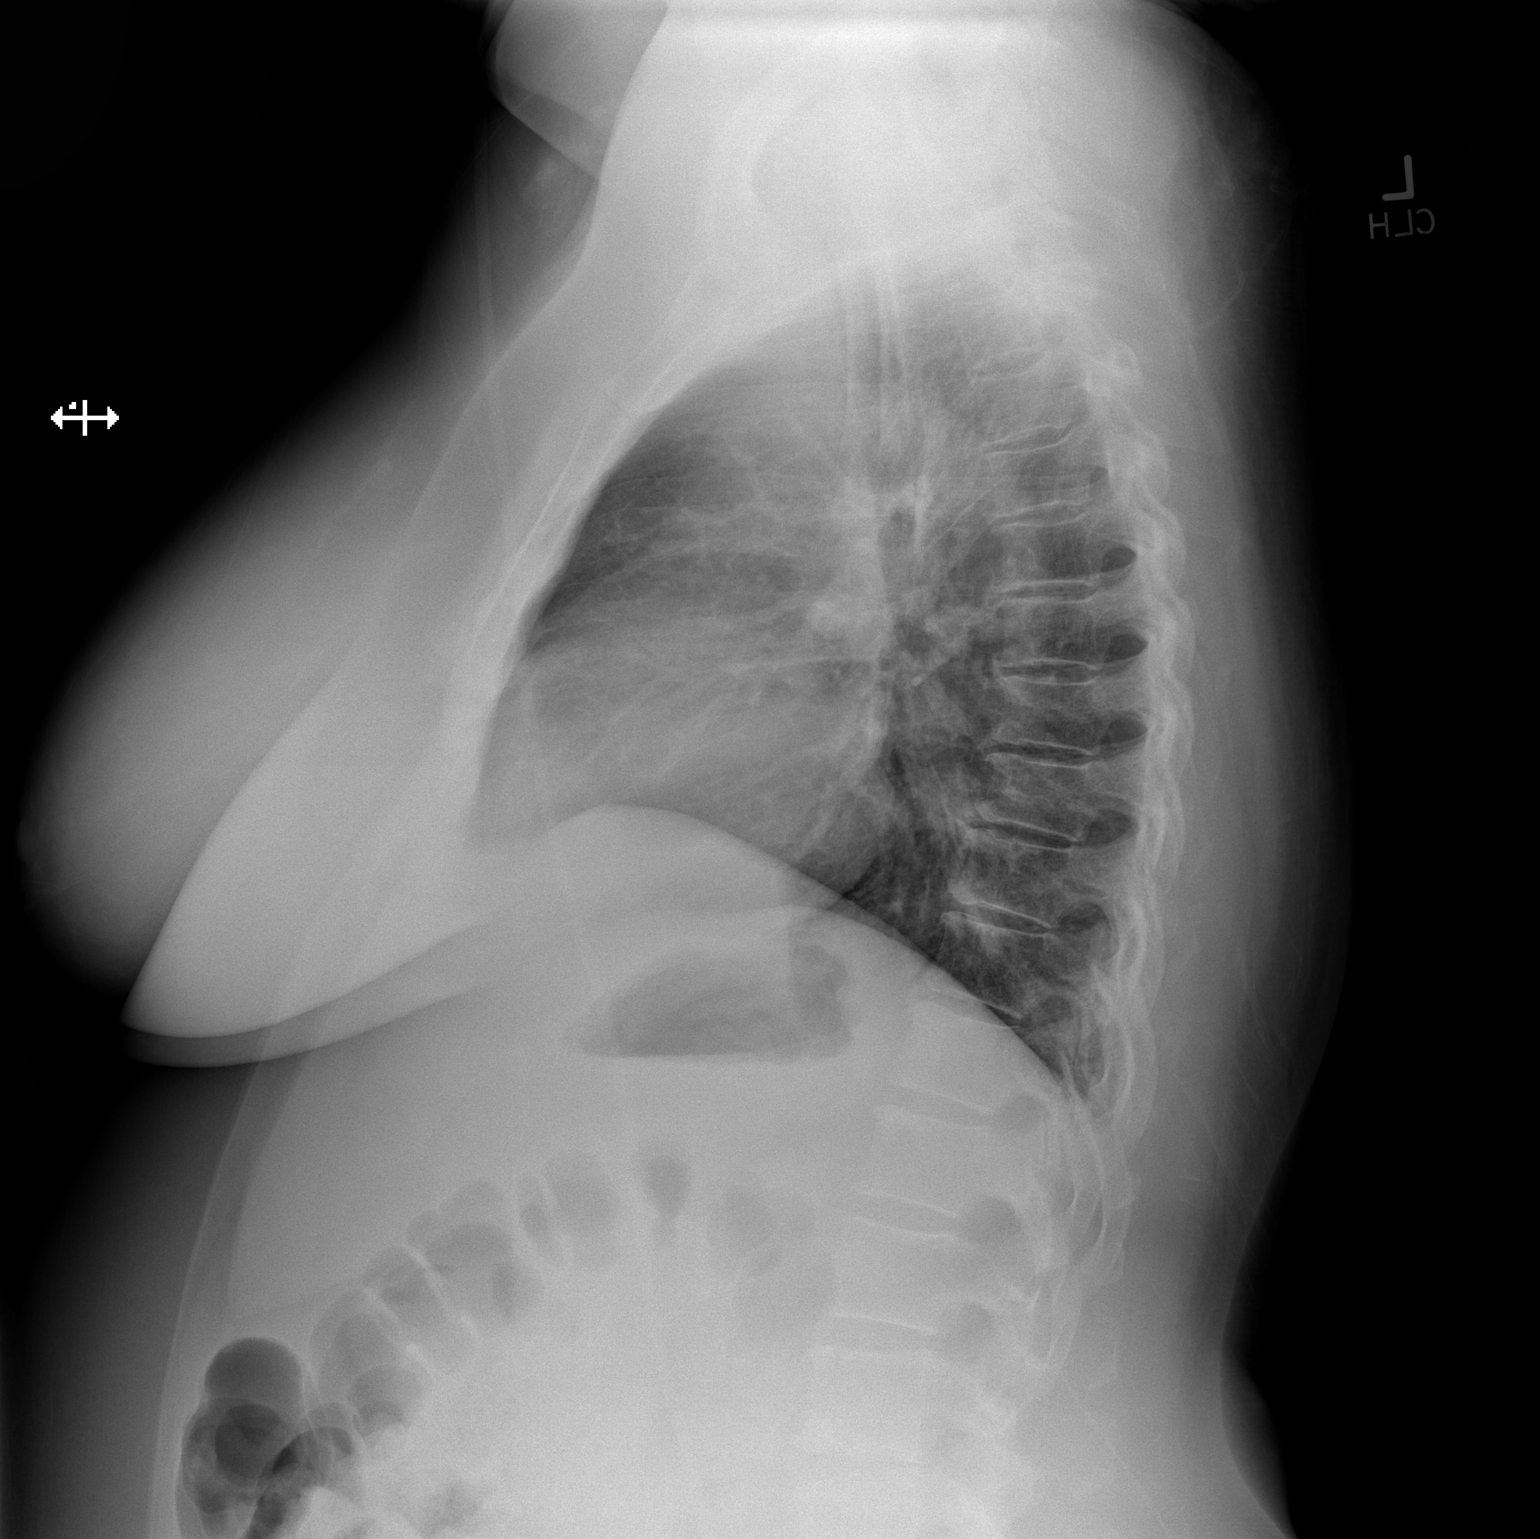

[2 of 2 positions shown; findings below may reference images not displayed]

FINDINGS: Cardiac silhouette is top-normal in size. No mediastinal or hilar
masses or evidence of adenopathy.

Clear lungs.  No pleural effusion or pneumothorax.

Bony thorax is intact.
IMPRESSION: No active cardiopulmonary disease.

## 2017-01-04 ENCOUNTER — Other Ambulatory Visit: Payer: Self-pay | Admitting: Internal Medicine

## 2017-01-04 DIAGNOSIS — Z1231 Encounter for screening mammogram for malignant neoplasm of breast: Secondary | ICD-10-CM

## 2017-01-16 ENCOUNTER — Encounter (HOSPITAL_COMMUNITY): Payer: Self-pay | Admitting: *Deleted

## 2017-01-16 ENCOUNTER — Emergency Department (HOSPITAL_COMMUNITY)
Admission: EM | Admit: 2017-01-16 | Discharge: 2017-01-16 | Disposition: A | Discharge status: Home / Self Care | Payer: BC Managed Care – PPO | Attending: Emergency Medicine | Admitting: Emergency Medicine

## 2017-01-16 ENCOUNTER — Emergency Department (HOSPITAL_COMMUNITY): Payer: BC Managed Care – PPO

## 2017-01-16 DIAGNOSIS — F17210 Nicotine dependence, cigarettes, uncomplicated: Secondary | ICD-10-CM | POA: Insufficient documentation

## 2017-01-16 DIAGNOSIS — J029 Acute pharyngitis, unspecified: Secondary | ICD-10-CM

## 2017-01-16 DIAGNOSIS — E049 Nontoxic goiter, unspecified: Secondary | ICD-10-CM

## 2017-01-16 DIAGNOSIS — E119 Type 2 diabetes mellitus without complications: Secondary | ICD-10-CM | POA: Insufficient documentation

## 2017-01-16 DIAGNOSIS — Z7984 Long term (current) use of oral hypoglycemic drugs: Secondary | ICD-10-CM | POA: Diagnosis not present

## 2017-01-16 DIAGNOSIS — I159 Secondary hypertension, unspecified: Secondary | ICD-10-CM

## 2017-01-16 DIAGNOSIS — I10 Essential (primary) hypertension: Secondary | ICD-10-CM | POA: Insufficient documentation

## 2017-01-16 LAB — CBC WITH DIFFERENTIAL/PLATELET
Basophils Absolute: 0 10*3/uL (ref 0.0–0.1)
Basophils Relative: 0 %
Eosinophils Absolute: 0.2 10*3/uL (ref 0.0–0.7)
Eosinophils Relative: 1 %
HEMATOCRIT: 39.9 % (ref 36.0–46.0)
HEMOGLOBIN: 12.5 g/dL (ref 12.0–15.0)
LYMPHS PCT: 28 %
Lymphs Abs: 3.3 10*3/uL (ref 0.7–4.0)
MCH: 26.4 pg (ref 26.0–34.0)
MCHC: 31.3 g/dL (ref 30.0–36.0)
MCV: 84.4 fL (ref 78.0–100.0)
MONOS PCT: 5 %
Monocytes Absolute: 0.5 10*3/uL (ref 0.1–1.0)
NEUTROS ABS: 7.9 10*3/uL — AB (ref 1.7–7.7)
NEUTROS PCT: 66 %
Platelets: 321 10*3/uL (ref 150–400)
RBC: 4.73 MIL/uL (ref 3.87–5.11)
RDW: 15 % (ref 11.5–15.5)
WBC: 11.9 10*3/uL — ABNORMAL HIGH (ref 4.0–10.5)

## 2017-01-16 LAB — COMPREHENSIVE METABOLIC PANEL
ALK PHOS: 84 U/L (ref 38–126)
ALT: 16 U/L (ref 14–54)
ANION GAP: 7 (ref 5–15)
AST: 17 U/L (ref 15–41)
Albumin: 3.6 g/dL (ref 3.5–5.0)
BILIRUBIN TOTAL: 0.4 mg/dL (ref 0.3–1.2)
BUN: 7 mg/dL (ref 6–20)
CALCIUM: 8.8 mg/dL — AB (ref 8.9–10.3)
CO2: 26 mmol/L (ref 22–32)
CREATININE: 0.5 mg/dL (ref 0.44–1.00)
Chloride: 106 mmol/L (ref 101–111)
GFR calc non Af Amer: >60 mL/min (ref >60)
Glucose, Bld: 108 mg/dL — ABNORMAL HIGH (ref 65–99)
Potassium: 3.8 mmol/L (ref 3.5–5.1)
Sodium: 139 mmol/L (ref 135–145)
TOTAL PROTEIN: 6.7 g/dL (ref 6.5–8.1)

## 2017-01-16 LAB — I-STAT CHEM 8, ED
BUN: 7 mg/dL (ref 6–20)
CREATININE: 0.4 mg/dL — AB (ref 0.44–1.00)
Calcium, Ion: 1.03 mmol/L — ABNORMAL LOW (ref 1.15–1.40)
Chloride: 104 mmol/L (ref 101–111)
Glucose, Bld: 108 mg/dL — ABNORMAL HIGH (ref 65–99)
HEMATOCRIT: 41 % (ref 36.0–46.0)
Hemoglobin: 13.9 g/dL (ref 12.0–15.0)
Potassium: 3.8 mmol/L (ref 3.5–5.1)
Sodium: 140 mmol/L (ref 135–145)
TCO2: 25 mmol/L (ref 22–32)

## 2017-01-16 LAB — RAPID STREP SCREEN (MED CTR MEBANE ONLY): Streptococcus, Group A Screen (Direct): NEGATIVE

## 2017-01-16 LAB — T4, FREE: Free T4: 0.83 ng/dL (ref 0.61–1.12)

## 2017-01-16 LAB — TSH: TSH: 0.903 u[IU]/mL (ref 0.350–4.500)

## 2017-01-16 LAB — MONONUCLEOSIS SCREEN: MONO SCREEN: NEGATIVE

## 2017-01-16 MED ORDER — HYDRALAZINE HCL 20 MG/ML IJ SOLN: 5.0000 mg | Freq: Once | INTRAMUSCULAR | Status: DC

## 2017-01-16 MED ORDER — METFORMIN HCL 500 MG PO TABS: 500.0000 mg | ORAL_TABLET | Freq: Two times a day (BID) | ORAL | 0 refills | Status: DC

## 2017-01-16 MED ORDER — LABETALOL HCL 5 MG/ML IV SOLN
5.0000 mg | Freq: Once | INTRAVENOUS | Status: AC
  Administered 2017-01-16: 5 mg via INTRAVENOUS
  Filled 2017-01-16: qty 4

## 2017-01-16 MED ORDER — SIMVASTATIN 10 MG PO TABS: 10.0000 mg | ORAL_TABLET | Freq: Every day | ORAL | 0 refills | Status: DC

## 2017-01-16 MED ORDER — OMEPRAZOLE 20 MG PO CPDR: 20.0000 mg | DELAYED_RELEASE_CAPSULE | Freq: Every day | ORAL | 0 refills | Status: DC

## 2017-01-16 MED ORDER — IOPAMIDOL (ISOVUE-300) INJECTION 61%
INTRAVENOUS | Status: AC
  Administered 2017-01-16: 75 mL
  Filled 2017-01-16: qty 75

## 2017-01-16 MED ORDER — LOSARTAN POTASSIUM 50 MG PO TABS
50.0000 mg | ORAL_TABLET | Freq: Once | ORAL | Status: AC
  Administered 2017-01-16: 50 mg via ORAL
  Filled 2017-01-16: qty 1

## 2017-01-16 MED ORDER — LOSARTAN POTASSIUM-HCTZ 50-12.5 MG PO TABS: 1.0000 | ORAL_TABLET | Freq: Every day | ORAL | 0 refills | Status: DC

## 2017-01-16 MED ORDER — SITAGLIPTIN PHOSPHATE 50 MG PO TABS: 50.0000 mg | ORAL_TABLET | Freq: Every day | ORAL | 0 refills | Status: DC

## 2017-01-16 MED ORDER — SITAGLIPTIN PHOSPHATE 100 MG PO TABS: 100.0000 mg | ORAL_TABLET | Freq: Every day | ORAL | 0 refills | Status: DC

## 2017-01-16 MED ORDER — DEXAMETHASONE SODIUM PHOSPHATE 10 MG/ML IJ SOLN
4.0000 mg | Freq: Once | INTRAMUSCULAR | Status: AC
  Administered 2017-01-16: 4 mg via INTRAVENOUS
  Filled 2017-01-16: qty 1

## 2017-01-16 MED ORDER — HYDROCHLOROTHIAZIDE 12.5 MG PO CAPS
12.5000 mg | ORAL_CAPSULE | Freq: Every day | ORAL | Status: DC
  Administered 2017-01-16: 12.5 mg via ORAL
  Filled 2017-01-16: qty 1

## 2017-01-16 NOTE — ED Provider Notes (Signed)
[facility] DEPT Provider Note   CSN: [id number] Arrival date & time: 01/16/17  1115     History   Chief Complaint Chief Complaint  Patient presents with  . Sore Throat    HPI Melissa Valdez is a 51 y.o. female.  HPI 51 year old African-American female past medical history significant for diabetes, GERD, hypertension, obesity that presents to the ED with complaints of difficulty swallowing. The patient states that over the past 2-3 days she has had worsening sore throat and some trouble swallowing. States it is painful to swallow. States that at night when she lays down she will notice that she has some difficulties breathing. The patient denies any associated rhinorrhea, cough, fevers, sick contacts. States that she has not been taking her medications for diabetes and hypertension for the past year. Denies any associated chest pain or shortness of breath. Patient states it feels like her throat was closing up and she'll wake up at night "gasping for air". She has not taken anything for her symptoms.  Pt denies any fever, chill, ha, vision changes, lightheadedness, dizziness, congestion,  cp, sob, cough, abd pain, n/v/d, urinary symptoms, change in bowel habits, melena, hematochezia, lower extremity paresthesias.  Past Medical History:  Diagnosis Date  . Diabetes mellitus without complication ([place])   . GERD (gastroesophageal reflux disease)   . Hypertension   . Obesity, Class III, BMI 40-49.9 (morbid obesity) ([place])   . Sleep apnea    cpap-     Patient Active Problem List   Diagnosis Date Noted  . Cellulitis and abscess of hand, except fingers and thumb 10/28/2014  . Hypokalemia 10/28/2014  . Hyperglycemia 10/28/2014  . Diabetes mellitus type II, controlled, with no complications ([place]) 37/16/9678  . Hypertension 10/28/2014  . Morbid obesity with BMI of 40.0-44.9, adult ([place]) 06/28/2014  . Dysmenorrhea 01/20/2014  . Abnormal uterine bleeding (AUB) 01/20/2014  . Fibroids,  intramural 01/19/2014    Past Surgical History:  Procedure Laterality Date  . boil on leg removed   1980s  . TUBAL LIGATION  1999    OB History    Gravida Para Term Preterm AB Living   2 2 2     2    SAB TAB Ectopic Multiple Live Births           2       Home Medications    Prior to Admission medications   Medication Sig Start Date End Date Taking? Authorizing Provider  diphenhydrAMINE (BENADRYL) 25 mg capsule Take 50 mg by mouth every 6 (six) hours as needed for sleep.   Yes [provider]  hydroxypropyl methylcellulose / hypromellose (ISOPTO TEARS / GONIOVISC) 2.5 % ophthalmic solution Place 2 drops into both eyes daily as needed for dry eyes.   Yes [provider]  ibuprofen (ADVIL,MOTRIN) 200 MG tablet Take 600 mg by mouth every 6 (six) hours as needed for moderate pain.   Yes [provider]  ACCU-CHEK AVIVA PLUS test strip  04/26/14   [provider]  cephALEXin (KEFLEX) 500 MG capsule Take 1 capsule (500 mg total) by mouth 3 (three) times daily. Patient not taking: Reported on 01/16/2017 07/12/15   Konrad Felix, PA  cephALEXin (KEFLEX) 500 MG capsule Take 1 capsule (500 mg total) by mouth 3 (three) times daily. Patient not taking: Reported on 01/16/2017 07/12/15   Konrad Felix, PA  ciprofloxacin (CIPRO) 500 MG tablet Take 1 tablet (500 mg total) by mouth every 12 (twelve) hours. Patient not taking: Reported on  01/16/2017 07/20/15   Tanna Furry, MD  hydrochlorothiazide (HYDRODIURIL) 25 MG tablet Take 25 mg by mouth every morning.    [provider]  losartan-hydrochlorothiazide (HYZAAR) 50-12.5 MG tablet Take 1 tablet by mouth daily. 01/16/17   Doristine Devoid, PA-C  losartan-hydrochlorothiazide (HYZAAR) 50-12.5 MG tablet Take 1 tablet by mouth daily. 01/16/17   Doristine Devoid, PA-C  metFORMIN (GLUCOPHAGE) 500 MG tablet Take 1 tablet (500 mg total) by mouth 2 (two) times daily with a meal. 01/16/17   Emett Stapel, Zack Seal, PA-C   metFORMIN (GLUCOPHAGE) 500 MG tablet Take 1 tablet (500 mg total) by mouth 2 (two) times daily with a meal. 01/16/17   Lyrik Buresh, Zack Seal, PA-C  norethindrone (ORTHO MICRONOR) 0.35 MG tablet Take 1 tablet (0.35 mg total) by mouth daily. Patient not taking: Reported on 06/09/2014 02/24/14   Lahoma Crocker, MD  omeprazole (PRILOSEC) 20 MG capsule Take 1 capsule (20 mg total) by mouth daily. 01/16/17   Doristine Devoid, PA-C  omeprazole (PRILOSEC) 20 MG capsule Take 1 capsule (20 mg total) by mouth daily. 01/16/17   Doristine Devoid, PA-C  phenazopyridine (PYRIDIUM) 200 MG tablet Take 1 tablet (200 mg total) by mouth 3 (three) times daily. Patient not taking: Reported on 01/16/2017 07/20/15   Tanna Furry, MD  simvastatin (ZOCOR) 10 MG tablet Take 1 tablet (10 mg total) by mouth daily. 01/16/17   Doristine Devoid, PA-C  simvastatin (ZOCOR) 10 MG tablet Take 1 tablet (10 mg total) by mouth daily. 01/16/17   Doristine Devoid, PA-C  sitaGLIPtin (JANUVIA) 100 MG tablet Take 1 tablet (100 mg total) by mouth daily. 01/16/17   Doristine Devoid, PA-C  sitaGLIPtin (JANUVIA) 50 MG tablet Take 1 tablet (50 mg total) by mouth daily. 01/16/17   Doristine Devoid, PA-C    Family History Family History  Problem Relation Age of Onset  . Hypertension Mother   . Diabetes Father     Social History Social History  Substance Use Topics  . Smoking status: Current Every Day Smoker    Packs/day: 0.25    Types: Cigarettes    Last attempt to quit: 12/29/2013  . Smokeless tobacco: Never Used  . Alcohol use 1.8 oz/week    2 Cans of beer, 1 Shots of liquor per week     Allergies   Patient has no known allergies.   Review of Systems Review of Systems  Constitutional: Negative for chills and fever.  HENT: Positive for sore throat and trouble swallowing. Negative for congestion.   Eyes: Negative for visual disturbance.  Respiratory: Positive for apnea. Negative for cough, shortness of breath and  stridor.   Cardiovascular: Negative for chest pain.  Gastrointestinal: Negative for abdominal pain, diarrhea, nausea and vomiting.  Genitourinary: Negative for dysuria, flank pain, frequency, hematuria, urgency, vaginal bleeding and vaginal discharge.  Musculoskeletal: Positive for neck pain. Negative for arthralgias and myalgias.  Skin: Negative for rash.  Neurological: Negative for dizziness, syncope, weakness, light-headedness, numbness and headaches.  Psychiatric/Behavioral: Negative for sleep disturbance. The patient is not nervous/anxious.      Physical Exam Updated Vital Signs BP (!) 183/104   Pulse 71   Temp 98 F (36.7 C) (Oral)   Resp 18   SpO2 96%   Physical Exam  Constitutional: She is oriented to person, place, and time. She appears well-developed and well-nourished.  Non-toxic appearance. No distress.  Obese female.  HENT:  Head: Normocephalic and atraumatic.  Nose: Nose normal.  Mouth/Throat: Uvula  is midline and mucous membranes are normal. No trismus in the jaw. No uvula swelling. Posterior oropharyngeal edema present. No oropharyngeal exudate, posterior oropharyngeal erythema or tonsillar abscesses. No tonsillar exudate.  Eyes: Pupils are equal, round, and reactive to light. Conjunctivae are normal. Right eye exhibits no discharge. Left eye exhibits no discharge.  Neck: Normal range of motion. Neck supple. Thyromegaly present.  Goiter noted. Nontender. No erythema.  Cardiovascular: Normal rate, regular rhythm, normal heart sounds and intact distal pulses.  Exam reveals no gallop and no friction rub.   No murmur heard. Pulmonary/Chest: Effort normal and breath sounds normal. No respiratory distress. She has no wheezes. She has no rales. She exhibits no tenderness.  No stridor noted. Speaking in complete sentences. Managing her secretions and tolerating her airway.  Abdominal: Soft. Bowel sounds are normal. There is no tenderness. There is no rebound and no guarding.   Musculoskeletal: Normal range of motion. She exhibits no tenderness.  Lymphadenopathy:    She has no cervical adenopathy.  Neurological: She is alert and oriented to person, place, and time.  Skin: Skin is warm and dry. Capillary refill takes less than 2 seconds.  Psychiatric: Her behavior is normal. Judgment and thought content normal.  Nursing note and vitals reviewed.    ED Treatments / Results  Labs (all labs ordered are listed, but only abnormal results are displayed) Labs Reviewed  CBC WITH DIFFERENTIAL/PLATELET - Abnormal; Notable for the following:       Result Value   WBC 11.9 (*)    Neutro Abs 7.9 (*)    All other components within normal limits  COMPREHENSIVE METABOLIC PANEL - Abnormal; Notable for the following:    Glucose, Bld 108 (*)    Calcium 8.8 (*)    All other components within normal limits  I-STAT CHEM 8, ED - Abnormal; Notable for the following:    Creatinine, Ser 0.40 (*)    Glucose, Bld 108 (*)    Calcium, Ion 1.03 (*)    All other components within normal limits  RAPID STREP SCREEN (NOT AT [facility])  CULTURE, GROUP A STREP ([employer])  TSH  T4, FREE  MONONUCLEOSIS SCREEN  T3    EKG  EKG Interpretation None       Radiology Ct Soft Tissue Neck W Contrast  Result Date: 01/16/2017 CLINICAL DATA:  Difficulty swallowing with throat soreness. EXAM: CT NECK WITH CONTRAST TECHNIQUE: Multidetector CT imaging of the neck was performed using the standard protocol following the bolus administration of intravenous contrast. CONTRAST:  4mL ISOVUE-300 IOPAMIDOL (ISOVUE-300) INJECTION 61% COMPARISON:  None. FINDINGS: Pharynx and larynx: Normal. No mass or swelling. Salivary glands: No inflammation, mass, or stone. Thyroid: Massive thyromegaly, with areas of nodular enhancement and calcification consistent with goiter. Mass effect on the airway LEFT-to-RIGHT results in subglottic narrowing. There is no significant substernal extension of the goiter. RIGHT lobe  measurements are approximately 5 x 4 x 8 cm. LEFT lobe measurements are approximately 4 x 4 x 7 cm. Lymph nodes: None enlarged or abnormal density. Vascular: Marked lateral displacement of the carotid arteries and jugular veins. Limited intracranial: Negative. Visualized orbits: Negative. Mastoids and visualized paranasal sinuses: Clear. Skeleton: No acute or aggressive process. Upper chest: RIGHT apical scarring, and bronchiectatic change with apical pleural thickening. No pneumothorax. Other: None. IMPRESSION: Massive goiter. Significant LEFT-to-RIGHT mass effect on the subglottic airway. Electronically Signed   By: Staci Righter M.D.   On: 01/16/2017 14:33    Procedures Procedures (including critical care time)  Medications Ordered in ED Medications  labetalol (NORMODYNE,TRANDATE) injection 5 mg (5 mg Intravenous Given 01/16/17 1451)  dexamethasone (DECADRON) injection 4 mg (4 mg Intravenous Given 01/16/17 1336)  iopamidol (ISOVUE-300) 61 % injection (75 mLs  Contrast Given 01/16/17 1403)  losartan (COZAAR) tablet 50 mg (50 mg Oral Given 01/16/17 1453)     Initial Impression / Assessment and Plan / ED Course  I have reviewed the triage vital signs and the nursing notes.  Pertinent labs & imaging results that were available during my care of the patient were reviewed by me and considered in my medical decision making (see chart for details).     Patient presents to the ED with complaints of worsening sore throat, difficulty swallowing, neck pain.of note patient is hypertensive in the ED. Has not been taking her medication for the past year. Denies any associated chest pain or shortness of breath.  Vital signs are reassuring. Patient is afebrile. No tachycardia or hypoxia noted. No tachypnea noted.  On exam patient does have some posterior oropharynx edema but no signs of erythema or exudate. Uvula is midline. Low suspicion for peritonsillar abscess. Strep test was negative.  Lung sounds are  clear to auscultation. Patient is managing secretions and tolerating her airway. Speaking in complete sentences.  On exam patient does have large goiter noted. No history of thyroid disease.  Feel that patient's symptoms likely due to enlarged goiter.  Basic lab work was obtained. Mild leukocytosis of 11,000. TSH and thyroid markers are normal. Negative mono screen.  CT imaging of soft neck was performed to rule out airway compromise from a large goiter.  Given the normal vital signs and normal thyroid labwork do not feel the patient is in thyroid storm or crisis.  CT scan revealed massive goiter. Significant LEFT-to-RIGHT mass effect on the subglottic airway.  Do not follow patients airway is compromised at this time given managing secretions, tolerating her airway, normal vital signs.  Did speak with Dr.Shoemaker with ENT. He did not fill it patient needs emergent intervention at this time and follow up this week in the clinic. He suggested starting patient on a PPI as some of her symptoms may be due to acid reflux. Feel that she may also have possible pharyngitis. Patient was given Decadron in the ED. She did have some improvement in her symptoms and swallowing. States that she was able to swallow more efficiently. Patient is tolerating by mouth fluids without any difficulties. Patient was given her home dose of blood pressure medicine and given labetalol in the ED. Mild improvement in her blood pressure.  Patient denies any associated symptoms of chest pain or shortness of breath. The patient's hypertension is chronic. Did not feel that further emergent workup is indicated at this time.  Pt is hemodynamically stable, in NAD, & able to ambulate in the ED. Evaluation does not show pathology that would require ongoing emergent intervention or inpatient treatment. I explained the diagnosis to the patient. Pain has been managed & has no complaints prior to dc. Pt is comfortable with above plan and  is stable for discharge at this time. All questions were answered prior to disposition. Strict return precautions for f/u to the ED were discussed. Encouraged follow up with PCP.  Home medicines ordered. Discussed with case manager who cut patient follow up PCP next week.  Pt dicussed with Dr. Darl Householder who is agreeable to the above plan.     Final Clinical Impressions(s) / ED Diagnoses   Final diagnoses:  Sore throat  Goiter  Secondary hypertension    New Prescriptions Discharge Medication List as of 01/16/2017  3:32 PM       Doristine Devoid, PA-C 01/16/17 2116    Doristine Devoid, PA-C 01/16/17 2117    Drenda Freeze, MD 01/17/17 1434

## 2017-01-16 NOTE — ED Notes (Signed)
Pt returned from CT °

## 2017-01-16 NOTE — Discharge Planning (Signed)
Melissa Valdez J. Clydene Laming, RN, BSN, [place]  [facility] set up appointment with Melissa Fail, PA-C at [facility] on 10/24 @2 :58.  Spoke with pt at bedside and advised to please arrive 15 min early and take a picture ID and your current medications.  Pt verbalizes understanding of keeping appointment.

## 2017-01-16 NOTE — Discharge Instructions (Signed)
You do have a large goiter on your thyroid. It is important that she follow up with the ENT doctor. Please call today to schedule an appointment. Continue taking her blood pressure medicine as prescribed. It is important that she follow-up with a primary care doctor.  If you develop any worsening swelling, not able swallow your saliva, cannot breathe return to the ED immediately.

## 2017-01-16 NOTE — ED Notes (Signed)
Pt's neck swelling reported to PA.

## 2017-01-16 NOTE — ED Notes (Signed)
Patient transported to CT 

## 2017-01-16 NOTE — ED Notes (Signed)
Pt c/o throat "feeling like it's closing up". States wakes "gasping". Pt appears to have swelling of bilateral neck, > right.

## 2017-01-16 NOTE — ED Triage Notes (Addendum)
To ED for eval of sore throat for past 2-3 days. Pain when swallowing. Pt speaks in full complete sentences. Airway patent. Throat appears red- no white patches noted. Resp e/u. Pt has hx of HTN but no meds for a year

## 2017-01-16 NOTE — ED Notes (Signed)
Pharmacy Tech at the bedside. Waiting on case management. Pt aware she is to be discharged after case management comes to talk to her.

## 2017-01-17 LAB — T3: T3 TOTAL: 124 ng/dL (ref 71–180)

## 2017-01-18 LAB — CULTURE, GROUP A STREP (THRC)

## 2017-01-22 ENCOUNTER — Ambulatory Visit
Admission: RE | Admit: 2017-01-22 | Discharge: 2017-01-22 | Disposition: A | Discharge status: Home / Self Care | Payer: BC Managed Care – PPO | Source: Ambulatory Visit | Attending: Internal Medicine | Admitting: Internal Medicine

## 2017-01-22 DIAGNOSIS — Z1231 Encounter for screening mammogram for malignant neoplasm of breast: Secondary | ICD-10-CM

## 2017-02-06 ENCOUNTER — Ambulatory Visit (INDEPENDENT_AMBULATORY_CARE_PROVIDER_SITE_OTHER): Payer: BC Managed Care – PPO | Admitting: Physician Assistant

## 2017-02-06 ENCOUNTER — Encounter (INDEPENDENT_AMBULATORY_CARE_PROVIDER_SITE_OTHER): Payer: Self-pay | Admitting: Physician Assistant

## 2017-02-06 VITALS — BP 158/103 | HR 80 | Temp 98.1°F | Ht 59.84 in | Wt 222.6 lb

## 2017-02-06 DIAGNOSIS — E119 Type 2 diabetes mellitus without complications: Secondary | ICD-10-CM | POA: Diagnosis not present

## 2017-02-06 DIAGNOSIS — R739 Hyperglycemia, unspecified: Secondary | ICD-10-CM | POA: Diagnosis not present

## 2017-02-06 DIAGNOSIS — E049 Nontoxic goiter, unspecified: Secondary | ICD-10-CM | POA: Diagnosis not present

## 2017-02-06 DIAGNOSIS — I10 Essential (primary) hypertension: Secondary | ICD-10-CM

## 2017-02-06 LAB — POCT GLYCOSYLATED HEMOGLOBIN (HGB A1C): HEMOGLOBIN A1C: 7.4

## 2017-02-06 MED ORDER — LOSARTAN POTASSIUM-HCTZ 50-12.5 MG PO TABS: 2.0000 | ORAL_TABLET | Freq: Every day | ORAL | 5 refills | Status: DC

## 2017-02-06 MED ORDER — GLIMEPIRIDE 2 MG PO TABS: 2.0000 mg | ORAL_TABLET | Freq: Every day | ORAL | 3 refills | Status: DC

## 2017-02-06 MED ORDER — METFORMIN HCL 1000 MG PO TABS: 1000.0000 mg | ORAL_TABLET | Freq: Two times a day (BID) | ORAL | 3 refills | Status: DC

## 2017-02-06 NOTE — Progress Notes (Signed)
Subjective:  Patient ID: Melissa Valdez, female    DOB: [birth year]  Age: 51 y.o. MRN: [id number]  CC:   HPI Melissa Valdez is a 51 y.o. female with a medical history of DM2, GERD, HTN, Obesity, and Sleep apnea. Presents with concern of goiter. She went to the ED 21 days ago for a sore throat and was incidentally found to have a goiter. TSH normal, T4 normal, CT revealed "Massive goiter. Significant Left to Right mass effect on the subglottic airway". Patient has not seen a provider for her thyroid since her ED visit. ED notes reports patient has not taken her anti-hypertensives and anti-glycemics for one year. Patient confirms this and says she has begun to take Hyzaar and Metformin again after ED visit. BP is noted to be elevated today. Complains of fatigue, polydipsia, and polyuria. Does not endorse CP, palpitations, SOB, frequent HA, visual disturbances, f/c/n/v, abdominal pain, confusion, rash, swelling, or GI/GU sxs.    Outpatient Medications Prior to Visit  Medication Sig Dispense Refill  . ACCU-CHEK AVIVA PLUS test strip   9  . hydroxypropyl methylcellulose / hypromellose (ISOPTO TEARS / GONIOVISC) 2.5 % ophthalmic solution Place 2 drops into both eyes daily as needed for dry eyes.    Marland Kitchen ibuprofen (ADVIL,MOTRIN) 200 MG tablet Take 600 mg by mouth every 6 (six) hours as needed for moderate pain.    Marland Kitchen losartan-hydrochlorothiazide (HYZAAR) 50-12.5 MG tablet Take 1 tablet by mouth daily. 30 tablet 0  . metFORMIN (GLUCOPHAGE) 500 MG tablet Take 1 tablet (500 mg total) by mouth 2 (two) times daily with a meal. 60 tablet 0  . omeprazole (PRILOSEC) 20 MG capsule Take 1 capsule (20 mg total) by mouth daily. 30 capsule 0  . simvastatin (ZOCOR) 10 MG tablet Take 1 tablet (10 mg total) by mouth daily. 30 tablet 0  . diphenhydrAMINE (BENADRYL) 25 mg capsule Take 50 mg by mouth every 6 (six) hours as needed for sleep.    . hydrochlorothiazide (HYDRODIURIL) 25 MG tablet Take 25 mg by mouth every morning.     . norethindrone (ORTHO MICRONOR) 0.35 MG tablet Take 1 tablet (0.35 mg total) by mouth daily. (Patient not taking: Reported on 06/09/2014) 1 Package 1  . phenazopyridine (PYRIDIUM) 200 MG tablet Take 1 tablet (200 mg total) by mouth 3 (three) times daily. (Patient not taking: Reported on 01/16/2017) 6 tablet 0  . sitaGLIPtin (JANUVIA) 100 MG tablet Take 1 tablet (100 mg total) by mouth daily. 30 tablet 0  . sitaGLIPtin (JANUVIA) 50 MG tablet Take 1 tablet (50 mg total) by mouth daily. 30 tablet 0  . cephALEXin (KEFLEX) 500 MG capsule Take 1 capsule (500 mg total) by mouth 3 (three) times daily. (Patient not taking: Reported on 01/16/2017) 21 capsule 0  . cephALEXin (KEFLEX) 500 MG capsule Take 1 capsule (500 mg total) by mouth 3 (three) times daily. (Patient not taking: Reported on 01/16/2017) 21 capsule 0  . ciprofloxacin (CIPRO) 500 MG tablet Take 1 tablet (500 mg total) by mouth every 12 (twelve) hours. (Patient not taking: Reported on 01/16/2017) 20 tablet 0  . losartan-hydrochlorothiazide (HYZAAR) 50-12.5 MG tablet Take 1 tablet by mouth daily. 30 tablet 0  . metFORMIN (GLUCOPHAGE) 500 MG tablet Take 1 tablet (500 mg total) by mouth 2 (two) times daily with a meal. 60 tablet 0  . omeprazole (PRILOSEC) 20 MG capsule Take 1 capsule (20 mg total) by mouth daily. 30 capsule 0  . simvastatin (ZOCOR) 10 MG tablet Take 1 tablet (  10 mg total) by mouth daily. 30 tablet 0   No facility-administered medications prior to visit.      ROS Review of Systems  Constitutional: Positive for malaise/fatigue. Negative for chills and fever.  Eyes: Negative for blurred vision.  Respiratory: Negative for shortness of breath.   Cardiovascular: Negative for chest pain and palpitations.  Gastrointestinal: Negative for abdominal pain and nausea.  Genitourinary: Negative for dysuria and hematuria.  Musculoskeletal: Negative for joint pain and myalgias.  Skin: Negative for rash.  Neurological: Negative for tingling  and headaches.  Endo/Heme/Allergies: Positive for polydipsia.  Psychiatric/Behavioral: Negative for depression. The patient is not nervous/anxious.     Objective:  BP (!) 158/103 (BP Location: Right Arm, Patient Position: Sitting, Cuff Size: Large)   Pulse 80   Temp 98.1 F (36.7 C) (Oral)   Ht 4' 11.84" (1.52 m)   Wt 222 lb 9.6 oz (101 kg)   LMP 01/22/2017   SpO2 95%   BMI 43.70 kg/m   BP/Weight 02/06/2017 40/0/8676 04/25/5091  Systolic BP 267 124 580  Diastolic BP 998 338 89  Wt. (Lbs) 222.6 - -  BMI 43.7 - 41.6      Physical Exam  Constitutional: She is oriented to person, place, and time.  Well developed, obese, NAD, polite  HENT:  Head: Normocephalic and atraumatic.  Eyes: No scleral icterus.  Exophthalmos   Neck: Normal range of motion. Neck supple. Thyromegaly present.  Cardiovascular: Normal rate, regular rhythm and normal heart sounds.   Pulmonary/Chest: Effort normal and breath sounds normal.  Musculoskeletal: She exhibits no edema.  Neurological: She is alert and oriented to person, place, and time. No cranial nerve deficit. Coordination normal.  Skin: Skin is warm and dry. No rash noted. No erythema. No pallor.  Psychiatric: She has a normal mood and affect. Her behavior is normal. Thought content normal.  Vitals reviewed.    Assessment & Plan:    1. Type 2 diabetes mellitus without complication, without long-term current use of insulin (HCC) - Increase metFORMIN (GLUCOPHAGE) 1000 MG tablet; Take 1 tablet (1,000 mg total) by mouth 2 (two) times daily with a meal.  Dispense: 180 tablet; Refill: 3 - Begin glimepiride (AMARYL) 2 MG tablet; Take 1 tablet (2 mg total) by mouth daily before breakfast.  Dispense: 30 tablet; Refill: 3  2. Hyperglycemia - HgB A1c 7.4%  3. Hypertension, unspecified type - Increase losartan-hydrochlorothiazide (HYZAAR) 50-12.5 MG tablet; Take 2 tablets by mouth daily.  Dispense: 60 tablet; Refill: 5 - Lipid Panel; Future  4.  Goiter - Thyroid Panel With TSH - Thyroid peroxidase antibody - NM THYROID SNG UPTAKE W/IMAGING; Future   Meds ordered this encounter  Medications  . losartan-hydrochlorothiazide (HYZAAR) 50-12.5 MG tablet    Sig: Take 2 tablets by mouth daily.    Dispense:  60 tablet    Refill:  5    Order Specific Question:   Supervising Provider    Answer:   Tresa Garter [id number]  . metFORMIN (GLUCOPHAGE) 1000 MG tablet    Sig: Take 1 tablet (1,000 mg total) by mouth 2 (two) times daily with a meal.    Dispense:  180 tablet    Refill:  3    Order Specific Question:   Supervising Provider    Answer:   Tresa Garter [id number]  . glimepiride (AMARYL) 2 MG tablet    Sig: Take 1 tablet (2 mg total) by mouth daily before breakfast.    Dispense:  30 tablet  Refill:  3    Order Specific Question:   Supervising Provider    Answer:   Tresa Garter [[id number]]    Follow-up: Return in about 4 weeks (around 03/06/2017) for htn.   Clent Demark PA

## 2017-02-06 NOTE — Patient Instructions (Signed)
Goiter A goiter is an enlarged thyroid gland. The thyroid gland is located in the lower front of the neck. The gland produces hormones that regulate mood, body temperature, pulse rate, and digestion. Most goiters are painless and are not a cause for serious concern. Goiters and conditions that cause goiters can be treated, if necessary. What are the causes? Causes of this condition include:  Diseases that attack healthy cells in your body (autoimmune diseases) and affect your thyroid function, such as: ? Graves disease. This causes too much thyroid hormone to be produced and it makes your thyroid overly active (hyperthyroidism). ? Hashimoto disease. This type of inflammation of the thyroid (thyroiditis) causes too little thyroid hormone to be produced and it makes your thyroid not active enough (hypothyroidism).  Other conditions that cause thyroiditis.  Nodular goiter. This means that there are one or more small growths on your thyroid. These can create too much thyroid hormone.  Pregnancy.  Thyroid cancer. This is rare.  Certain medicines.  Radiation exposure.  Iodine deficiency.  In some cases, the cause may not be known (idiopathic). What increases the risk? This condition is more likely to develop in:  People who have a family history of goiter.  Women.  People who do not get enough iodine in their diet.  People who are older than 84.  People who smoke tobacco.  What are the signs or symptoms? Common symptoms of this condition include:  Swelling in the lower part of the neck. This swelling can range from a very small bump to a large lump.  A tight feeling in the throat.  A hoarse voice.  Other symptoms include:  Coughing.  Wheezing.  Difficulty swallowing.  Difficulty breathing.  Bulging neck veins.  Dizziness.  In some cases, there are no symptoms and thyroid hormone levels may be normal. When a goiter is the result of hyperthyroidism, symptoms may  also include:  Nervousness or restlessness.  Inability to tolerate heat.  Unexplained weight loss.  Diarrhea.  Change in the texture of hair or skin.  Changes in heart beat, such as skipped beats, extra beats, or a rapid heart rate.  Loss of menstruation.  Shaky hands.  Increased appetite.  Sleep problems.  When a goiter is the result of hypothyroidism, symptoms may also include:  Feeling like you have no energy (lethargy).  Inability to tolerate cold.  Weight gain that is not explained by a change in diet or exercise habits.  Dry skin.  Coarse hair.  Menstrual irregularity.  Constipation.  Sadness or depression.  How is this diagnosed? This condition may be diagnosed with a medical history and physical exam. You may also have other tests, including:  Blood tests to check thyroid function.  Imaging tests, such as: ? Ultrasonography. ? CT scan. ? MRI. ? Thyroid scan. You will be given a safe radioactive injection, then images will be taken of your thyroid.  Tissue sample (biopsy) of the goiter or any nodules. This checks to see if the goiter or nodules are cancerous.  How is this treated? Treatment for this condition depends on the cause. Treatment may include:  Medicines to control your thyroid.  Anti-inflammatory or steroid medicines, if inflammation is the cause.  Iodine supplements or changes in diet, if the goiter is caused by iodine deficiency.  Radiation therapy.  Surgery to remove your thyroid.  In some cases, no treatment is necessary, and your health care provider will monitor your condition at regular checkups. Follow these instructions at  home:  Follow recommendations from your health care provider for any changes to your diet.  Take over-the-counter and prescription medicines only as told by your health care provider.  Do not use any tobacco products, including cigarettes, chewing tobacco, or e-cigarettes. If you need help quitting,  ask your health care provider.  Keep all follow-up appointments as told by your health care provider. This is important. Contact a health care provider if:  Your symptoms do not get better with treatment. Get help right away if:  You develop sudden, unexplained confusion or other mental changes.  You have nausea, vomiting, or diarrhea.  You develop a fever.  Your skin or the whites of your eyes appear yellow (jaundice).  You develop chest pain.  You have trouble breathing or swallowing.  You suddenly become very weak.  You experience extreme restlessness. This information is not intended to replace advice given to you by your health care provider. Make sure you discuss any questions you have with your health care provider. Document Released: 09/20/2009 Document Revised: 10/21/2015 Document Reviewed: 03/29/2014 Elsevier Interactive Patient Education  2018 [employer] Inc.  

## 2017-02-07 ENCOUNTER — Telehealth (INDEPENDENT_AMBULATORY_CARE_PROVIDER_SITE_OTHER): Payer: Self-pay

## 2017-02-07 LAB — THYROID PANEL WITH TSH
Free Thyroxine Index: 1.8 (ref 1.2–4.9)
T3 Uptake Ratio: 24 % (ref 24–39)
T4 TOTAL: 7.6 ug/dL (ref 4.5–12.0)
TSH: 0.627 u[IU]/mL (ref 0.450–4.500)

## 2017-02-07 LAB — THYROID PEROXIDASE ANTIBODY: Thyroperoxidase Ab SerPl-aCnc: 13 IU/mL (ref 0–34)

## 2017-02-07 NOTE — Telephone Encounter (Signed)
-----   Message from Clent Demark, PA-C sent at 02/07/2017  9:05 AM EDT ----- Thyroid results normal. Will be awaiting thyroid uptake scan.

## 2017-02-07 NOTE — Telephone Encounter (Signed)
Patient aware of normal lab results. [name], CMA

## 2017-02-28 ENCOUNTER — Encounter (HOSPITAL_COMMUNITY)
Admission: RE | Admit: 2017-02-28 | Discharge: 2017-02-28 | Disposition: A | Discharge status: Home / Self Care | Payer: BC Managed Care – PPO | Source: Ambulatory Visit | Attending: Physician Assistant | Admitting: Physician Assistant

## 2017-02-28 DIAGNOSIS — E049 Nontoxic goiter, unspecified: Secondary | ICD-10-CM | POA: Insufficient documentation

## 2017-02-28 MED ORDER — SODIUM IODIDE I 131 CAPSULE
10.9000 | Freq: Once | INTRAVENOUS | Status: AC | PRN
Start: 2017-02-28 — End: 2017-02-28
  Administered 2017-02-28: 10.9 via ORAL

## 2017-03-01 ENCOUNTER — Encounter (HOSPITAL_COMMUNITY)
Admission: RE | Admit: 2017-03-01 | Discharge: 2017-03-01 | Disposition: A | Discharge status: Home / Self Care | Payer: BC Managed Care – PPO | Source: Ambulatory Visit | Attending: Physician Assistant | Admitting: Physician Assistant

## 2017-03-01 MED ORDER — SODIUM PERTECHNETATE TC 99M INJECTION
9.6300 | Freq: Once | INTRAVENOUS | Status: AC | PRN
  Administered 2017-03-01: 9.63 via INTRAVENOUS

## 2017-03-04 ENCOUNTER — Other Ambulatory Visit (INDEPENDENT_AMBULATORY_CARE_PROVIDER_SITE_OTHER): Payer: Self-pay | Admitting: Physician Assistant

## 2017-03-04 DIAGNOSIS — E042 Nontoxic multinodular goiter: Secondary | ICD-10-CM

## 2017-03-05 ENCOUNTER — Telehealth (INDEPENDENT_AMBULATORY_CARE_PROVIDER_SITE_OTHER): Payer: Self-pay

## 2017-03-05 NOTE — Telephone Encounter (Signed)
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 She will now be referred to endocrinology for further evaluation and possible thyroidectomy.

## 2017-03-05 NOTE — Telephone Encounter (Signed)
Patient called the office to reschedule appointment and was provided with lab results. [name], CMA

## 2017-03-06 ENCOUNTER — Ambulatory Visit (INDEPENDENT_AMBULATORY_CARE_PROVIDER_SITE_OTHER): Payer: BC Managed Care – PPO | Admitting: Physician Assistant

## 2017-03-06 ENCOUNTER — Other Ambulatory Visit: Payer: Self-pay

## 2017-03-06 ENCOUNTER — Encounter (INDEPENDENT_AMBULATORY_CARE_PROVIDER_SITE_OTHER): Payer: Self-pay | Admitting: Physician Assistant

## 2017-03-06 VITALS — BP 145/83 | HR 80 | Temp 97.6°F | Wt 220.2 lb

## 2017-03-06 DIAGNOSIS — I10 Essential (primary) hypertension: Secondary | ICD-10-CM | POA: Diagnosis not present

## 2017-03-06 DIAGNOSIS — E119 Type 2 diabetes mellitus without complications: Secondary | ICD-10-CM

## 2017-03-06 MED ORDER — HYDROCHLOROTHIAZIDE 25 MG PO TABS: 25.0000 mg | ORAL_TABLET | Freq: Every day | ORAL | 1 refills | Status: DC

## 2017-03-06 MED ORDER — AMLODIPINE BESYLATE 10 MG PO TABS: 10.0000 mg | ORAL_TABLET | Freq: Every day | ORAL | 1 refills | Status: DC

## 2017-03-06 NOTE — Progress Notes (Signed)
Subjective:  Patient ID: Melissa Valdez, female    DOB: 1965-05-24  Age: 51 y.o. MRN: [id number]  CC: f/u HTN  HPI  Melissa Valdez is a 51 y.o. female with a medical history of DM2, GERD, HTN, Obesity, and Sleep apnea presents to f/u on HTN. Had Hyzaar increased one month ago. Last BP 158/103 mmHg on month ago. BP 145/83 mmHg today in clinic. Takes medication as directed. Does not endorse following any particular diet or exercising regularly. Patient is in a rush and wants to hurry with the encounter. Does not endorse and symptoms or complaints.    Outpatient Medications Prior to Visit  Medication Sig Dispense Refill  . ACCU-CHEK AVIVA PLUS test strip   9  . diphenhydrAMINE (BENADRYL) 25 mg capsule Take 50 mg by mouth every 6 (six) hours as needed for sleep.    Marland Kitchen glimepiride (AMARYL) 2 MG tablet Take 1 tablet (2 mg total) by mouth daily before breakfast. 30 tablet 3  . hydroxypropyl methylcellulose / hypromellose (ISOPTO TEARS / GONIOVISC) 2.5 % ophthalmic solution Place 2 drops into both eyes daily as needed for dry eyes.    Marland Kitchen ibuprofen (ADVIL,MOTRIN) 200 MG tablet Take 600 mg by mouth every 6 (six) hours as needed for moderate pain.    Marland Kitchen losartan-hydrochlorothiazide (HYZAAR) 50-12.5 MG tablet Take 2 tablets by mouth daily. 60 tablet 5  . metFORMIN (GLUCOPHAGE) 1000 MG tablet Take 1 tablet (1,000 mg total) by mouth 2 (two) times daily with a meal. 180 tablet 3  . omeprazole (PRILOSEC) 20 MG capsule Take 1 capsule (20 mg total) by mouth daily. 30 capsule 0  . simvastatin (ZOCOR) 10 MG tablet Take 1 tablet (10 mg total) by mouth daily. 30 tablet 0   No facility-administered medications prior to visit.      ROS Review of Systems  Constitutional: Negative for chills, fever and malaise/fatigue.  Eyes: Negative for blurred vision.  Respiratory: Negative for shortness of breath.   Cardiovascular: Negative for chest pain and palpitations.  Gastrointestinal: Negative for abdominal pain and  nausea.  Genitourinary: Negative for dysuria and hematuria.  Musculoskeletal: Negative for joint pain and myalgias.  Skin: Negative for rash.  Neurological: Negative for tingling and headaches.  Psychiatric/Behavioral: Negative for depression. The patient is not nervous/anxious.     Objective:   Blood pressure (!) 145/83, pulse 80, temperature 97.6 F (36.4 C), temperature source Oral, weight 220 lb 3.2 oz (99.9 kg), last menstrual period 02/17/2017, SpO2 93 %.    Physical Exam  Constitutional: She is oriented to person, place, and time.  Well developed, obese, NAD, polite  HENT:  Head: Normocephalic and atraumatic.  Eyes: No scleral icterus.  Cardiovascular: Normal rate, regular rhythm and normal heart sounds.  Pulmonary/Chest: Effort normal and breath sounds normal.  Musculoskeletal: She exhibits no edema.  Neurological: She is alert and oriented to person, place, and time. No cranial nerve deficit. Coordination normal.  Skin: Skin is warm and dry. No rash noted. No erythema. No pallor.  Psychiatric: She has a normal mood and affect. Her behavior is normal. Thought content normal.  Vitals reviewed.    Assessment & Plan:   1. Hypertension, unspecified type - Stop Hyzaar due to recall. Advised patient to consult pharmacy in regards to her Hyzaar prescription. - Begin amLODipine (NORVASC) 10 MG tablet; Take 1 tablet (10 mg total) by mouth daily.  Dispense: 90 tablet; Refill: 1 - Begin hydrochlorothiazide (HYDRODIURIL) 25 MG tablet; Take 1 tablet (25 mg total) by mouth  daily. Take on tablet in the morning.  Dispense: 90 tablet; Refill: 1  2. Controlled type 2 diabetes mellitus without complication, unspecified whether long term insulin use ([place]) - Ambulatory referral to Ophthalmology   Meds ordered this encounter  Medications  . amLODipine (NORVASC) 10 MG tablet    Sig: Take 1 tablet (10 mg total) by mouth daily.    Dispense:  90 tablet    Refill:  1    Order Specific  Question:   Supervising Provider    Answer:   Tresa Garter [id number]  . hydrochlorothiazide (HYDRODIURIL) 25 MG tablet    Sig: Take 1 tablet (25 mg total) by mouth daily. Take on tablet in the morning.    Dispense:  90 tablet    Refill:  1    Order Specific Question:   Supervising Provider    Answer:   Tresa Garter [id number]    Follow-up: Return in about 4 weeks (around 04/03/2017) for PAP and vaccines.   Clent Demark PA

## 2017-03-06 NOTE — Patient Instructions (Signed)
Please go to your pharmacy and speak to them about the Hyzaar recall.  Managing Your Hypertension Hypertension is commonly called high blood pressure. This is when the force of your blood pressing against the walls of your arteries is too strong. Arteries are blood vessels that carry blood from your heart throughout your body. Hypertension forces the heart to work harder to pump blood, and may cause the arteries to become narrow or stiff. Having untreated or uncontrolled hypertension can cause heart attack, stroke, kidney disease, and other problems. What are blood pressure readings? A blood pressure reading consists of a higher number over a lower number. Ideally, your blood pressure should be below 120/80. The first ("top") number is called the systolic pressure. It is a measure of the pressure in your arteries as your heart beats. The second ("bottom") number is called the diastolic pressure. It is a measure of the pressure in your arteries as the heart relaxes. What does my blood pressure reading mean? Blood pressure is classified into four stages. Based on your blood pressure reading, your health care provider may use the following stages to determine what type of treatment you need, if any. Systolic pressure and diastolic pressure are measured in a unit called mm Hg. Normal  Systolic pressure: below 478.  Diastolic pressure: below 80. Elevated  Systolic pressure: 295-621.  Diastolic pressure: below 80. Hypertension stage 1  Systolic pressure: 308-657.  Diastolic pressure: 84-69. Hypertension stage 2  Systolic pressure: 629 or above.  Diastolic pressure: 90 or above. What health risks are associated with hypertension? Managing your hypertension is an important responsibility. Uncontrolled hypertension can lead to:  A heart attack.  A stroke.  A weakened blood vessel (aneurysm).  Heart failure.  Kidney damage.  Eye damage.  Metabolic syndrome.  Memory and concentration  problems.  What changes can I make to manage my hypertension? Hypertension can be managed by making lifestyle changes and possibly by taking medicines. Your health care provider will help you make a plan to bring your blood pressure within a normal range. Eating and drinking  Eat a diet that is high in fiber and potassium, and low in salt (sodium), added sugar, and fat. An example eating plan is called the DASH (Dietary Approaches to Stop Hypertension) diet. To eat this way: ? Eat plenty of fresh fruits and vegetables. Try to fill half of your plate at each meal with fruits and vegetables. ? Eat whole grains, such as whole wheat pasta, brown rice, or whole grain bread. Fill about one quarter of your plate with whole grains. ? Eat low-fat diary products. ? Avoid fatty cuts of meat, processed or cured meats, and poultry with skin. Fill about one quarter of your plate with lean proteins such as fish, chicken without skin, beans, eggs, and tofu. ? Avoid premade and processed foods. These tend to be higher in sodium, added sugar, and fat.  Reduce your daily sodium intake. Most people with hypertension should eat less than 1,500 mg of sodium a day.  Limit alcohol intake to no more than 1 drink a day for nonpregnant women and 2 drinks a day for men. One drink equals 12 oz of beer, 5 oz of wine, or 1 oz of hard liquor. Lifestyle  Work with your health care provider to maintain a healthy body weight, or to lose weight. Ask what an ideal weight is for you.  Get at least 30 minutes of exercise that causes your heart to beat faster (aerobic exercise) most  days of the week. Activities may include walking, swimming, or biking.  Include exercise to strengthen your muscles (resistance exercise), such as weight lifting, as part of your weekly exercise routine. Try to do these types of exercises for 30 minutes at least 3 days a week.  Do not use any products that contain nicotine or tobacco, such as  cigarettes and e-cigarettes. If you need help quitting, ask your health care provider.  Control any long-term (chronic) conditions you have, such as high cholesterol or diabetes. Monitoring  Monitor your blood pressure at home as told by your health care provider. Your personal target blood pressure may vary depending on your medical conditions, your age, and other factors.  Have your blood pressure checked regularly, as often as told by your health care provider. Working with your health care provider  Review all the medicines you take with your health care provider because there may be side effects or interactions.  Talk with your health care provider about your diet, exercise habits, and other lifestyle factors that may be contributing to hypertension.  Visit your health care provider regularly. Your health care provider can help you create and adjust your plan for managing hypertension. Will I need medicine to control my blood pressure? Your health care provider may prescribe medicine if lifestyle changes are not enough to get your blood pressure under control, and if:  Your systolic blood pressure is 130 or higher.  Your diastolic blood pressure is 80 or higher.  Take medicines only as told by your health care provider. Follow the directions carefully. Blood pressure medicines must be taken as prescribed. The medicine does not work as well when you skip doses. Skipping doses also puts you at risk for problems. Contact a health care provider if:  You think you are having a reaction to medicines you have taken.  You have repeated (recurrent) headaches.  You feel dizzy.  You have swelling in your ankles.  You have trouble with your vision. Get help right away if:  You develop a severe headache or confusion.  You have unusual weakness or numbness, or you feel faint.  You have severe pain in your chest or abdomen.  You vomit repeatedly.  You have trouble  breathing. Summary  Hypertension is when the force of blood pumping through your arteries is too strong. If this condition is not controlled, it may put you at risk for serious complications.  Your personal target blood pressure may vary depending on your medical conditions, your age, and other factors. For most people, a normal blood pressure is less than 120/80.  Hypertension is managed by lifestyle changes, medicines, or both. Lifestyle changes include weight loss, eating a healthy, low-sodium diet, exercising more, and limiting alcohol. This information is not intended to replace advice given to you by your health care provider. Make sure you discuss any questions you have with your health care provider. Document Released: 12/26/2011 Document Revised: 02/29/2016 Document Reviewed: 02/29/2016 Elsevier Interactive Patient Education  [employer].

## 2017-04-04 ENCOUNTER — Ambulatory Visit (INDEPENDENT_AMBULATORY_CARE_PROVIDER_SITE_OTHER): Payer: BC Managed Care – PPO | Admitting: Physician Assistant

## 2017-04-04 ENCOUNTER — Other Ambulatory Visit: Payer: Self-pay

## 2017-04-04 ENCOUNTER — Encounter (INDEPENDENT_AMBULATORY_CARE_PROVIDER_SITE_OTHER): Payer: Self-pay | Admitting: Physician Assistant

## 2017-04-04 ENCOUNTER — Other Ambulatory Visit (HOSPITAL_COMMUNITY)
Admission: RE | Admit: 2017-04-04 | Discharge: 2017-04-04 | Disposition: A | Discharge status: Home / Self Care | Payer: BC Managed Care – PPO | Source: Ambulatory Visit | Attending: Physician Assistant | Admitting: Physician Assistant

## 2017-04-04 VITALS — BP 154/94 | HR 77 | Temp 98.8°F | Wt 223.0 lb

## 2017-04-04 DIAGNOSIS — B9689 Other specified bacterial agents as the cause of diseases classified elsewhere: Secondary | ICD-10-CM | POA: Insufficient documentation

## 2017-04-04 DIAGNOSIS — Z23 Encounter for immunization: Secondary | ICD-10-CM | POA: Diagnosis not present

## 2017-04-04 DIAGNOSIS — Z124 Encounter for screening for malignant neoplasm of cervix: Secondary | ICD-10-CM | POA: Diagnosis present

## 2017-04-04 DIAGNOSIS — Z1211 Encounter for screening for malignant neoplasm of colon: Secondary | ICD-10-CM

## 2017-04-04 DIAGNOSIS — N760 Acute vaginitis: Secondary | ICD-10-CM | POA: Insufficient documentation

## 2017-04-04 NOTE — Patient Instructions (Signed)
Stool for Occult Blood Test Why am I having this test? Stool for occult blood, or fecal occult blood test (FOBT), is a test that is used to screen for gastrointestinal (GI) bleeding, which may be an indicator of colon cancer. This test can also detect small amounts of blood in your stool (feces) from other causes, such as ulcers or hemorrhoids. This test is usually done as part of an annual routine examination after age 51. What kind of sample is taken? A sample of your stool is required for this test. Your health care provider may collect the sample with a swab of the rectum. Or, you may be instructed to collect the sample in a container at home. If you are instructed to collect the sample, your health care provider will provide you with the instructions and the supplies that you will need to do that. How do I collect samples at home? A stool sample may need to be collected at home. When collecting a sample at home, make sure that you:  Use the sterile containers and other supplies that were given to you from the lab.  Do not mix urine, toilet paper, or water with your sample.  Label all slides and containers with your name and the date when you collected the sample.  Your health care provider or lab staff will give you one or more test "cards." You will collect a separate sample from three different stools, usually on different days that follow each other. Follow these steps for each sample: 1. Collect a stool sample into a clean container. 2. With an applicator stick, apply a thin smear of stool sample onto each filter paper square or window that is on the card. 3. Allow the filter paper to dry. After it is dry, the sample will be stable.  Usually, you will return all of the samples to your health care provider or lab at the same time. How do I prepare for this test?  Do not eat any red meat within three days before testing.  Follow your health care provider's instructions about eating  and drinking prior to the test. Your health care provider may instruct you to avoid other foods or substances.  Ask your health care provider about taking or not taking your medicines prior to the test. You may be instructed to avoid certain medicines that are known to interfere with this test. How are the results reported? Your test results will be reported as either positive or negative. It is your responsibility to obtain your test results. Ask the lab or department performing the test when and how you will get your results. What do the results mean? A negative test result means that there is no occult blood within the stool. A negative result is normal. A positive test result may mean that there is blood in the stool. Causes of blood in the stool include:  GI tumors.  Certain GI diseases.  GI trauma or recent surgery.  Hemorrhoids.  Talk with your health care provider to discuss your results, treatment options, and if necessary, the need for more tests. Talk with your health care provider if you have any questions about your results. Talk with your health care provider to discuss your results, treatment options, and if necessary, the need for more tests. Talk with your health care provider if you have any questions about your results. This information is not intended to replace advice given to you by your health care provider. Make sure you discuss  any questions you have with your health care provider. Document Released: 04/27/2004 Document Revised: 11/30/2015 Document Reviewed: 08/28/2013 Elsevier Interactive Patient Education  [employer].

## 2017-04-04 NOTE — Progress Notes (Addendum)
   Subjective:  Patient ID: Melissa Valdez, female    DOB: February 21, 1966  Age: 51 y.o. MRN: [id number]  CC: PAP  HPI  Melissa Valdez a 51 y.o.femalewith a medical history of DM2, GERD, HTN, Obesity, and Sleep apnea presents for a PAP and vaccinations. Does not endorse any complaints or symptoms.    Outpatient Medications Prior to Visit  Medication Sig Dispense Refill  . ACCU-CHEK AVIVA PLUS test strip   9  . amLODipine (NORVASC) 10 MG tablet Take 1 tablet (10 mg total) by mouth daily. 90 tablet 1  . glimepiride (AMARYL) 2 MG tablet Take 1 tablet (2 mg total) by mouth daily before breakfast. 30 tablet 3  . hydrochlorothiazide (HYDRODIURIL) 25 MG tablet Take 1 tablet (25 mg total) by mouth daily. Take on tablet in the morning. 90 tablet 1  . hydroxypropyl methylcellulose / hypromellose (ISOPTO TEARS / GONIOVISC) 2.5 % ophthalmic solution Place 2 drops into both eyes daily as needed for dry eyes.    Marland Kitchen ibuprofen (ADVIL,MOTRIN) 200 MG tablet Take 600 mg by mouth every 6 (six) hours as needed for moderate pain.    . metFORMIN (GLUCOPHAGE) 1000 MG tablet Take 1 tablet (1,000 mg total) by mouth 2 (two) times daily with a meal. 180 tablet 3  . omeprazole (PRILOSEC) 20 MG capsule Take 1 capsule (20 mg total) by mouth daily. 30 capsule 0  . simvastatin (ZOCOR) 10 MG tablet Take 1 tablet (10 mg total) by mouth daily. 30 tablet 0  . diphenhydrAMINE (BENADRYL) 25 mg capsule Take 50 mg by mouth every 6 (six) hours as needed for sleep.     No facility-administered medications prior to visit.      ROS Review of Systems  Constitutional: Negative for chills, fever and malaise/fatigue.  Eyes: Negative for blurred vision.  Respiratory: Negative for shortness of breath.   Cardiovascular: Negative for chest pain and palpitations.  Gastrointestinal: Negative for abdominal pain and nausea.  Genitourinary: Negative for dysuria and hematuria.  Musculoskeletal: Negative for joint pain and myalgias.  Skin:  Negative for rash.  Neurological: Negative for tingling and headaches.  Psychiatric/Behavioral: Negative for depression. The patient is not nervous/anxious.     Objective:  BP (!) 154/94 (BP Location: Right Arm, Patient Position: Sitting, Cuff Size: Large)   Pulse 77   Temp 98.8 F (37.1 C) (Oral)   Wt 223 lb (101.2 kg)   LMP 03/16/2017 (Exact Date)   SpO2 95%   BMI 43.78 kg/m   BP/Weight 04/04/2017 03/06/2017 75/64/3329  Systolic BP 518 841 660  Diastolic BP 94 83 630  Wt. (Lbs) 223 220.2 222.6  BMI 43.78 43.23 43.7      Physical Exam  Genitourinary:  Genitourinary Comments: Small amount of vaginal discharge, whitish in color. No cervical motion tenderness. No adnexal tenderness or mass bilaterally. No uterine tenderness or mass.     Assessment & Plan:    1. Screening for cervical cancer - Cytology - PAP [place]  2. Special screening for malignant neoplasms, colon - Fecal occult blood, imunochemical  3. Need for Tdap vaccination - Tdap vaccine greater than or equal to 7yo IM  4. Need for prophylactic vaccination against Streptococcus pneumoniae (pneumococcus) - Pneumococcal conjugate vaccine 13-valent     Follow-up: Return if symptoms worsen or fail to improve.   Clent Demark PA

## 2017-04-10 ENCOUNTER — Other Ambulatory Visit (INDEPENDENT_AMBULATORY_CARE_PROVIDER_SITE_OTHER): Payer: Self-pay | Admitting: Physician Assistant

## 2017-04-10 DIAGNOSIS — N760 Acute vaginitis: Principal | ICD-10-CM

## 2017-04-10 DIAGNOSIS — B9689 Other specified bacterial agents as the cause of diseases classified elsewhere: Secondary | ICD-10-CM

## 2017-04-10 LAB — CYTOLOGY - PAP
Bacterial vaginitis: POSITIVE — AB
CANDIDA VAGINITIS: NEGATIVE
Chlamydia: NEGATIVE
Diagnosis: NEGATIVE
Neisseria Gonorrhea: NEGATIVE
TRICH (WINDOWPATH): NEGATIVE

## 2017-04-10 MED ORDER — METRONIDAZOLE 500 MG PO TABS: 500.0000 mg | ORAL_TABLET | Freq: Two times a day (BID) | ORAL | 0 refills | Status: AC

## 2017-04-11 ENCOUNTER — Telehealth (INDEPENDENT_AMBULATORY_CARE_PROVIDER_SITE_OTHER): Payer: Self-pay

## 2017-04-11 NOTE — Telephone Encounter (Signed)
Patient is aware that pap is negative for cancer cells but positive for bacterial vaginitis, she is aware to pick up Rx from [facility] on [address]. [name], CMA

## 2017-04-11 NOTE — Telephone Encounter (Signed)
-----   Message from Clent Demark, PA-C sent at 04/10/2017  1:35 PM EST ----- PAP negative for cancer cells but positive for bacterial vaginitis. I have sent antibiotic to [facility] on [address].

## 2017-04-14 LAB — FECAL OCCULT BLOOD, IMMUNOCHEMICAL: FECAL OCCULT BLD: NEGATIVE

## 2017-04-15 ENCOUNTER — Telehealth (INDEPENDENT_AMBULATORY_CARE_PROVIDER_SITE_OTHER): Payer: Self-pay

## 2017-04-15 NOTE — Telephone Encounter (Signed)
Patient is aware of negative FIT. Melissa Valdez, CMA  

## 2017-04-15 NOTE — Telephone Encounter (Signed)
-----   Message from Clent Demark, PA-C sent at 04/15/2017  8:21 AM EST ----- Negative FIT.

## 2017-05-09 ENCOUNTER — Encounter: Payer: Self-pay | Admitting: Endocrinology

## 2017-05-09 ENCOUNTER — Ambulatory Visit (INDEPENDENT_AMBULATORY_CARE_PROVIDER_SITE_OTHER): Payer: BC Managed Care – PPO | Admitting: Endocrinology

## 2017-05-09 DIAGNOSIS — R0600 Dyspnea, unspecified: Secondary | ICD-10-CM

## 2017-05-09 DIAGNOSIS — E049 Nontoxic goiter, unspecified: Secondary | ICD-10-CM | POA: Diagnosis not present

## 2017-05-09 NOTE — Patient Instructions (Signed)
Let's check the ultrasound.  you will receive a phone call, about a day and time for an appointment. If it looks OK, please come back for a follow-up appointment in 6 months.

## 2017-05-09 NOTE — Progress Notes (Signed)
Subjective:    Patient ID: Melissa Valdez, female    DOB: December 10, 1965, 52 y.o.   MRN: [id number]  HPI Pt is referred by Domenica Fail, PA, for nodular thyroid.  Pt was noted to have a nodule at the thyroid in 2018.  she has no h/o XRT or surgery to the neck.  She has moderate swelling at the ant neck, but no assoc pain.   Past Medical History:  Diagnosis Date  . Diabetes mellitus without complication ([place])   . GERD (gastroesophageal reflux disease)   . Hypertension   . Obesity, Class III, BMI 40-49.9 (morbid obesity) ([place])   . Sleep apnea    cpap-     Past Surgical History:  Procedure Laterality Date  . boil on leg removed   1980s  . TUBAL LIGATION  1999    Social History   Socioeconomic History  . Marital status: Single    Spouse name: Not on file  . Number of children: Not on file  . Years of education: Not on file  . Highest education level: Not on file  Social Needs  . Financial resource strain: Not on file  . Food insecurity - worry: Not on file  . Food insecurity - inability: Not on file  . Transportation needs - medical: Not on file  . Transportation needs - non-medical: Not on file  Occupational History  . Not on file  Tobacco Use  . Smoking status: Current Every Day Smoker    Packs/day: 0.25    Types: Cigarettes  . Smokeless tobacco: Never Used  Substance and Sexual Activity  . Alcohol use: Yes    Alcohol/week: 1.8 oz    Types: 2 Cans of beer, 1 Shots of liquor per week  . Drug use: No  . Sexual activity: Yes    Partners: Male    Birth control/protection: Surgical    Comment: Last Encnounter 4 days ago  Other Topics Concern  . Not on file  Social History Narrative  . Not on file    Current Outpatient Medications on File Prior to Visit  Medication Sig Dispense Refill  . ACCU-CHEK AVIVA PLUS test strip   9  . amLODipine (NORVASC) 10 MG tablet Take 1 tablet (10 mg total) by mouth daily. 90 tablet 1  . diphenhydrAMINE (BENADRYL) 25 mg capsule Take 50 mg  by mouth every 6 (six) hours as needed for sleep.    Marland Kitchen glimepiride (AMARYL) 2 MG tablet Take 1 tablet (2 mg total) by mouth daily before breakfast. 30 tablet 3  . hydrochlorothiazide (HYDRODIURIL) 25 MG tablet Take 1 tablet (25 mg total) by mouth daily. Take on tablet in the morning. 90 tablet 1  . hydroxypropyl methylcellulose / hypromellose (ISOPTO TEARS / GONIOVISC) 2.5 % ophthalmic solution Place 2 drops into both eyes daily as needed for dry eyes.    Marland Kitchen ibuprofen (ADVIL,MOTRIN) 200 MG tablet Take 600 mg by mouth every 6 (six) hours as needed for moderate pain.    . metFORMIN (GLUCOPHAGE) 1000 MG tablet Take 1 tablet (1,000 mg total) by mouth 2 (two) times daily with a meal. 180 tablet 3  . omeprazole (PRILOSEC) 20 MG capsule Take 1 capsule (20 mg total) by mouth daily. 30 capsule 0  . simvastatin (ZOCOR) 10 MG tablet Take 1 tablet (10 mg total) by mouth daily. 30 tablet 0   No current facility-administered medications on file prior to visit.     No Known Allergies  Family History  Problem Relation  Age of Onset  . Hypertension Mother   . Diabetes Father   . Goiter Sister     BP (!) 128/92 (BP Location: Left Arm, Patient Position: Sitting, Cuff Size: Normal)   Pulse 94   Temp 98.4 F (36.9 C) (Oral)   Ht 5' (1.524 m)   Wt 225 lb (102.1 kg)   LMP 04/16/2017 (Exact Date)   SpO2 98%   BMI 43.94 kg/m     Review of Systems Denies hoarseness, diplopia, chest pain, cough, diarrhea, itching, flushing, easy bruising, cold intolerance, headache, numbness, and rhinorrhea.  She has slight dysphagia, mild depression, weight gain, and doe.       Objective:   Physical Exam VS: see vs page GEN: no distress HEAD: head: no deformity eyes: no periorbital swelling, no proptosis external nose and ears are normal mouth: no lesion seen NECK: large goiter is noted. CHEST WALL: no deformity LUNGS: clear to auscultation CV: reg rate and rhythm, no murmur ABD: abdomen is soft, nontender.  no  hepatosplenomegaly.  not distended.  no hernia MUSCULOSKELETAL: muscle bulk and strength are grossly normal.  no obvious joint swelling.  gait is normal and steady EXTEMITIES: no deformity.  no edema PULSES: no carotid bruit NEURO:  cn 2-12 grossly intact.   readily moves all 4's.  sensation is intact to touch on all 4's SKIN:  Normal texture and temperature.  No rash or suspicious lesion is visible.   NODES:  None palpable at the neck PSYCH: alert, well-oriented.  Does not appear anxious nor depressed.  CT: Massive thyromegaly, with areas of nodular enhancement and calcification consistent with goiter. Mass effect on the airway.  LEFT-to-RIGHT results in subglottic narrowing. There is no significant substernal extension of the goiter.   Lab Results  Component Value Date   TSH 0.627 02/06/2017   T3TOTAL 124 01/16/2017   T4TOTAL 7.6 02/06/2017   I personally reviewed spirometry tracing (today): Indication: sob Impression: Moderate restriction No comparison is available     Assessment & Plan:  Large goiter, new.  euthyroid Dyspnea, not thyroid-related  Patient Instructions  Let's check the ultrasound.  you will receive a phone call, about a day and time for an appointment. If it looks OK, please come back for a follow-up appointment in 6 months.

## 2017-05-14 ENCOUNTER — Other Ambulatory Visit (INDEPENDENT_AMBULATORY_CARE_PROVIDER_SITE_OTHER): Payer: Self-pay | Admitting: Physician Assistant

## 2017-05-14 DIAGNOSIS — N760 Acute vaginitis: Principal | ICD-10-CM

## 2017-05-14 DIAGNOSIS — B9689 Other specified bacterial agents as the cause of diseases classified elsewhere: Secondary | ICD-10-CM

## 2017-05-29 ENCOUNTER — Ambulatory Visit
Admission: RE | Admit: 2017-05-29 | Discharge: 2017-05-29 | Disposition: A | Discharge status: Home / Self Care | Payer: BC Managed Care – PPO | Source: Ambulatory Visit | Attending: Endocrinology | Admitting: Endocrinology

## 2017-05-29 DIAGNOSIS — E049 Nontoxic goiter, unspecified: Secondary | ICD-10-CM

## 2017-05-30 ENCOUNTER — Other Ambulatory Visit: Payer: Self-pay | Admitting: Endocrinology

## 2017-05-30 DIAGNOSIS — E049 Nontoxic goiter, unspecified: Secondary | ICD-10-CM

## 2017-06-04 ENCOUNTER — Other Ambulatory Visit: Payer: Self-pay | Admitting: Endocrinology

## 2017-06-06 ENCOUNTER — Other Ambulatory Visit: Payer: Self-pay | Admitting: Endocrinology

## 2017-06-06 DIAGNOSIS — E049 Nontoxic goiter, unspecified: Secondary | ICD-10-CM

## 2017-06-10 ENCOUNTER — Other Ambulatory Visit (INDEPENDENT_AMBULATORY_CARE_PROVIDER_SITE_OTHER): Payer: Self-pay | Admitting: Physician Assistant

## 2017-06-10 DIAGNOSIS — E119 Type 2 diabetes mellitus without complications: Secondary | ICD-10-CM

## 2017-06-18 ENCOUNTER — Telehealth: Payer: Self-pay | Admitting: Endocrinology

## 2017-06-18 NOTE — Telephone Encounter (Signed)
FWD to PCP. [name] S Roberts, CMA  

## 2017-06-18 NOTE — Telephone Encounter (Signed)
-----   Message from Tanna Furry sent at 06/10/2017  9:48 AM EST ----- Good Morning Dr. Loanne Drilling,  Sorry to be a bother, but there are two biopsy orders in epic for this pt. One happens to be one of the fna addt'l lesion orders that we can't use. Can you please change that one to the normal fna thyroid lesion so we can get your patient scheduled?  Thanks, B.L

## 2017-06-18 NOTE — Telephone Encounter (Signed)
please call [facility]: Please ask that this be scheduled in [place], as we can't get this done here.

## 2017-06-30 ENCOUNTER — Other Ambulatory Visit: Payer: Self-pay | Admitting: Endocrinology

## 2017-06-30 DIAGNOSIS — E049 Nontoxic goiter, unspecified: Secondary | ICD-10-CM

## 2017-07-12 ENCOUNTER — Emergency Department (HOSPITAL_COMMUNITY)
Admission: EM | Admit: 2017-07-12 | Discharge: 2017-07-12 | Disposition: A | Discharge status: Home / Self Care | Payer: BC Managed Care – PPO | Attending: Emergency Medicine | Admitting: Emergency Medicine

## 2017-07-12 ENCOUNTER — Emergency Department (HOSPITAL_COMMUNITY): Payer: BC Managed Care – PPO

## 2017-07-12 ENCOUNTER — Encounter (HOSPITAL_COMMUNITY): Payer: Self-pay | Admitting: *Deleted

## 2017-07-12 ENCOUNTER — Other Ambulatory Visit: Payer: Self-pay

## 2017-07-12 DIAGNOSIS — R609 Edema, unspecified: Secondary | ICD-10-CM

## 2017-07-12 DIAGNOSIS — F17210 Nicotine dependence, cigarettes, uncomplicated: Secondary | ICD-10-CM | POA: Insufficient documentation

## 2017-07-12 DIAGNOSIS — M25561 Pain in right knee: Secondary | ICD-10-CM | POA: Insufficient documentation

## 2017-07-12 DIAGNOSIS — Z7984 Long term (current) use of oral hypoglycemic drugs: Secondary | ICD-10-CM | POA: Insufficient documentation

## 2017-07-12 DIAGNOSIS — M25562 Pain in left knee: Secondary | ICD-10-CM | POA: Insufficient documentation

## 2017-07-12 DIAGNOSIS — E119 Type 2 diabetes mellitus without complications: Secondary | ICD-10-CM | POA: Insufficient documentation

## 2017-07-12 DIAGNOSIS — G8929 Other chronic pain: Secondary | ICD-10-CM | POA: Diagnosis not present

## 2017-07-12 DIAGNOSIS — I10 Essential (primary) hypertension: Secondary | ICD-10-CM | POA: Insufficient documentation

## 2017-07-12 DIAGNOSIS — R52 Pain, unspecified: Secondary | ICD-10-CM

## 2017-07-12 MED ORDER — ACETAMINOPHEN 325 MG PO TABS
650.0000 mg | ORAL_TABLET | Freq: Once | ORAL | Status: AC
  Administered 2017-07-12: 650 mg via ORAL
  Filled 2017-07-12: qty 2

## 2017-07-12 NOTE — ED Provider Notes (Signed)
[facility] EMERGENCY DEPARTMENT Provider Note   CSN: [id number] Arrival date & time: 07/12/17  0730     History   Chief Complaint Chief Complaint  Patient presents with  . Knee Pain    HPI Airabella Barley is a 52 y.o. female with a history of morbid obesity, diabetes who presents the emergency department today for bilateral knee pain over the last 2-3 weeks.  Patient states that over the last 2-3 weeks since starting a new job where she is required to stand on concrete for long periods of time she has noticed a dull, achy pain in her bilateral frontal knees that is worse with long periods of standing and ambulation.  She states that the pain is typically worse after long periods of rest and improved after constant periods of ambulation.  She has been taking her "diabetic medications" for this without relief.  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  No previous knee surgeries.  She denies any fever, trauma, joint swelling, overlying erythema, calf pain, hip/ankle pain, no numbness/tingling or any weakness.  HPI  Past Medical History:  Diagnosis Date  . Diabetes mellitus without complication ([place])   . GERD (gastroesophageal reflux disease)   . Hypertension   . Obesity, Class III, BMI 40-49.9 (morbid obesity) ([place])   . Sleep apnea    cpap-     Patient Active Problem List   Diagnosis Date Noted  . Dyspnea 05/09/2017  . Goiter 05/09/2017  . Cellulitis and abscess of hand, except fingers and thumb 10/28/2014  . Hypokalemia 10/28/2014  . Hyperglycemia 10/28/2014  . Diabetes mellitus type II, controlled, with no complications ([place]) 03/47/4259  . Hypertension 10/28/2014  . Morbid obesity with BMI of 40.0-44.9, adult ([place]) 06/28/2014  . Dysmenorrhea 01/20/2014  . Abnormal uterine bleeding (AUB) 01/20/2014  . Fibroids, intramural 01/19/2014    Past Surgical History:  Procedure Laterality Date  . boil on leg  removed   1980s  . TUBAL LIGATION  1999     OB History    Gravida  2   Para  2   Term  2   Preterm      AB      Living  2     SAB      TAB      Ectopic      Multiple      Live Births  2            Home Medications    Prior to Admission medications   Medication Sig Start Date End Date Taking? Authorizing Provider  ACCU-CHEK AVIVA PLUS test strip  04/26/14   [provider]  amLODipine (NORVASC) 10 MG tablet Take 1 tablet (10 mg total) by mouth daily. 03/06/17   Clent Demark, PA-C  diphenhydrAMINE (BENADRYL) 25 mg capsule Take 50 mg by mouth every 6 (six) hours as needed for sleep.    [provider]  glimepiride (AMARYL) 2 MG tablet TAKE 1 TABLET BY MOUTH DAILY BEFORE BREAKFAST 06/21/17   Clent Demark, PA-C  hydrochlorothiazide (HYDRODIURIL) 25 MG tablet Take 1 tablet (25 mg total) by mouth daily. Take on tablet in the morning. 03/06/17   Clent Demark, PA-C  hydroxypropyl methylcellulose / hypromellose (ISOPTO TEARS / GONIOVISC) 2.5 % ophthalmic solution Place 2 drops into both eyes daily as needed for dry eyes.    [provider]  ibuprofen (ADVIL,MOTRIN) 200 MG  tablet Take 600 mg by mouth every 6 (six) hours as needed for moderate pain.    [provider]  metFORMIN (GLUCOPHAGE) 1000 MG tablet Take 1 tablet (1,000 mg total) by mouth 2 (two) times daily with a meal. 02/06/17   Clent Demark, PA-C  omeprazole (PRILOSEC) 20 MG capsule Take 1 capsule (20 mg total) by mouth daily. 01/16/17   Doristine Devoid, PA-C  simvastatin (ZOCOR) 10 MG tablet Take 1 tablet (10 mg total) by mouth daily. 01/16/17   Doristine Devoid, PA-C    Family History Family History  Problem Relation Age of Onset  . Hypertension Mother   . Diabetes Father   . Goiter Sister     Social History Social History   Tobacco Use  . Smoking status: Current Every Day Smoker    Packs/day: 0.25    Types: Cigarettes  . Smokeless tobacco:  Never Used  Substance Use Topics  . Alcohol use: Yes    Alcohol/week: 1.8 oz    Types: 2 Cans of beer, 1 Shots of liquor per week  . Drug use: No     Allergies   Patient has no known allergies.   Review of Systems Review of Systems  All other systems reviewed and are negative.    Physical Exam Updated Vital Signs BP (!) 158/91 (BP Location: Right Arm)   Pulse 81   Temp 97.9 F (36.6 C) (Oral)   Resp 20   LMP 06/14/2017 (Exact Date)   SpO2 97%   Physical Exam  Constitutional: She appears well-developed and well-nourished.  HENT:  Head: Normocephalic and atraumatic.  Right Ear: External ear normal.  Left Ear: External ear normal.  Eyes: Conjunctivae are normal. Right eye exhibits no discharge. Left eye exhibits no discharge. No scleral icterus.  Cardiovascular:  Pulses:      Dorsalis pedis pulses are 2+ on the right side, and 2+ on the left side.       Posterior tibial pulses are 2+ on the right side, and 2+ on the left side.  No lower extremity edema.  Calves are symmetric in size bilaterally.  Pulmonary/Chest: Effort normal. No respiratory distress.  Musculoskeletal:       Right hip: Normal.       Left hip: Normal.       Right ankle: Normal.       Left ankle: Normal.  Right knee: Parents are relatively normal.  There is no obvious deformity.  No skin swelling, erythema, heat, fluctuance or break the skin.  Visualization and exam are slightly obstructed due to the patient's size.  There is tenderness palpation over the anterior joint line as well as the medial joint line.  No posterior knee tenderness palpation, quadriceps tenderness, patella tenderness, MCL or LCL tenderness. Active and passive flexion and extension ntact without pain. Negative Lachman's test. Compartments soft. Neurovascularly intact distally to site of injury.  Neurological: She is alert. She has normal strength. No sensory deficit.  Normal gait  Skin: Skin is warm and dry. Capillary refill takes  less than 2 seconds. No erythema. No pallor.  No overlying joint swelling, skin erythema or heat  Psychiatric: She has a normal mood and affect.  Nursing note and vitals reviewed.    ED Treatments / Results  Labs (all labs ordered are listed, but only abnormal results are displayed) Labs Reviewed - No data to display  EKG None  Radiology Dg Knee 1-2 Views Left  Result Date: 07/12/2017 CLINICAL DATA:  Pain and swelling in both knees, no known injury, question fluid in knee EXAM: LEFT KNEE - 1-2 VIEW COMPARISON:  08/12/2010 FINDINGS: Osseous mineralization low normal. Tricompartmental joint space narrowing and spur formation. No acute fracture, dislocation, or bone destruction. Small knee joint effusion. IMPRESSION: Degenerative changes and small joint effusion at LEFT knee. No acute abnormalities. Electronically Signed   By: Lavonia Dana M.D.   On: 07/12/2017 11:00   Dg Knee 1-2 Views Right  Result Date: 07/12/2017 CLINICAL DATA:  Swelling, knee pain.  No injury. EXAM: RIGHT KNEE - 1-2 VIEW COMPARISON:  None. FINDINGS: Moderate degenerative narrowing of the medial compartment with associated mild osseous spurring. Additional mild osseous spurring about the lateral and patellofemoral compartments. No acute or suspicious osseous finding. No fracture line or displaced fracture fragment. No appreciable joint effusion and adjacent soft tissues are unremarkable. IMPRESSION: 1. Tricompartmental degenerative changes, mild to moderate in degree, as detailed above. 2. No acute findings.  No evidence of joint effusion seen. Electronically Signed   By: Franki Cabot M.D.   On: 07/12/2017 10:42    Procedures Procedures (including critical care time)  Medications Ordered in ED Medications  acetaminophen (TYLENOL) tablet 650 mg (650 mg Oral Given 07/12/17 1037)     Initial Impression / Assessment and Plan / ED Course  I have reviewed the triage vital signs and the nursing notes.  Pertinent labs &  imaging results that were available during my care of the patient were reviewed by me and considered in my medical decision making (see chart for details).     52 y.o. female with bilateral knee pain that is worse after periods of rest and improved after long periods of walking.  No history of trauma.  Exam reassuring as above but limited due to patient size.  She is neurovascular intact. Due to the bilateral nature, no fever, no joint swelling, no overlying erythema and normal range of motion do not suspect septic joint. Doubt bilateral DVT.  X-rays are consistent with arthritic-like changes.  Will place patient in Ace bandage.  Recommended PT exercises, Tylenol.  Pain controlled in the emergency department.  Referral given for orthopedics if symptoms persist for further evaluation.  Return precautions discussed.  Patient appears safe for discharge.  Final Clinical Impressions(s) / ED Diagnoses   Final diagnoses:  Chronic pain of both knees    ED Discharge Orders    None       Lorelle Gibbs 07/12/17 1122    Charlesetta Shanks, MD 07/21/17 [phone]

## 2017-07-12 NOTE — ED Triage Notes (Signed)
Pt is here with bilateral knee swelling and has had this before

## 2017-07-12 NOTE — ED Notes (Signed)
Ace wrap applied to left knee

## 2017-07-12 NOTE — Discharge Instructions (Signed)
You were seen here today for knee pain Your x-rays show arthritis. Please follow attached handouts Please take 1000 mg of Tylenol every 6 hours as needed for pain Please follow-up with orthopedist listed

## 2017-07-24 ENCOUNTER — Ambulatory Visit
Admission: RE | Admit: 2017-07-24 | Discharge: 2017-07-24 | Disposition: A | Discharge status: Home / Self Care | Payer: BC Managed Care – PPO | Source: Ambulatory Visit | Attending: Endocrinology | Admitting: Endocrinology

## 2017-07-24 ENCOUNTER — Other Ambulatory Visit (HOSPITAL_COMMUNITY)
Admission: RE | Admit: 2017-07-24 | Discharge: 2017-07-24 | Disposition: A | Discharge status: Home / Self Care | Payer: BC Managed Care – PPO | Source: Ambulatory Visit | Attending: Radiology | Admitting: Radiology

## 2017-07-24 DIAGNOSIS — E049 Nontoxic goiter, unspecified: Secondary | ICD-10-CM

## 2017-08-14 LAB — HM DIABETES EYE EXAM

## 2017-08-20 ENCOUNTER — Emergency Department (HOSPITAL_COMMUNITY)
Admission: EM | Admit: 2017-08-20 | Discharge: 2017-08-20 | Disposition: A | Discharge status: Home / Self Care | Payer: BC Managed Care – PPO | Attending: Emergency Medicine | Admitting: Emergency Medicine

## 2017-08-20 ENCOUNTER — Other Ambulatory Visit: Payer: Self-pay

## 2017-08-20 ENCOUNTER — Emergency Department (HOSPITAL_COMMUNITY): Payer: BC Managed Care – PPO

## 2017-08-20 ENCOUNTER — Encounter (HOSPITAL_COMMUNITY): Payer: Self-pay | Admitting: *Deleted

## 2017-08-20 DIAGNOSIS — Z7984 Long term (current) use of oral hypoglycemic drugs: Secondary | ICD-10-CM | POA: Insufficient documentation

## 2017-08-20 DIAGNOSIS — Z79899 Other long term (current) drug therapy: Secondary | ICD-10-CM | POA: Insufficient documentation

## 2017-08-20 DIAGNOSIS — F17210 Nicotine dependence, cigarettes, uncomplicated: Secondary | ICD-10-CM | POA: Diagnosis not present

## 2017-08-20 DIAGNOSIS — R51 Headache: Secondary | ICD-10-CM | POA: Insufficient documentation

## 2017-08-20 DIAGNOSIS — E119 Type 2 diabetes mellitus without complications: Secondary | ICD-10-CM | POA: Insufficient documentation

## 2017-08-20 DIAGNOSIS — R519 Headache, unspecified: Secondary | ICD-10-CM

## 2017-08-20 DIAGNOSIS — I10 Essential (primary) hypertension: Secondary | ICD-10-CM | POA: Diagnosis not present

## 2017-08-20 LAB — COMPREHENSIVE METABOLIC PANEL
ALBUMIN: 3.3 g/dL — AB (ref 3.5–5.0)
ALK PHOS: 77 U/L (ref 38–126)
ALT: 15 U/L (ref 14–54)
ANION GAP: 10 (ref 5–15)
AST: 20 U/L (ref 15–41)
BUN: 8 mg/dL (ref 6–20)
CALCIUM: 8.3 mg/dL — AB (ref 8.9–10.3)
CO2: 25 mmol/L (ref 22–32)
CREATININE: 0.5 mg/dL (ref 0.44–1.00)
Chloride: 103 mmol/L (ref 101–111)
GFR calc Af Amer: >60 mL/min (ref >60)
GFR calc non Af Amer: >60 mL/min (ref >60)
Glucose, Bld: 174 mg/dL — ABNORMAL HIGH (ref 65–99)
POTASSIUM: 3.5 mmol/L (ref 3.5–5.1)
SODIUM: 138 mmol/L (ref 135–145)
TOTAL PROTEIN: 6.5 g/dL (ref 6.5–8.1)
Total Bilirubin: 0.3 mg/dL (ref 0.3–1.2)

## 2017-08-20 LAB — CBC
HCT: 40.6 % (ref 36.0–46.0)
Hemoglobin: 12.7 g/dL (ref 12.0–15.0)
MCH: 26.2 pg (ref 26.0–34.0)
MCHC: 31.3 g/dL (ref 30.0–36.0)
MCV: 83.9 fL (ref 78.0–100.0)
PLATELETS: 279 10*3/uL (ref 150–400)
RBC: 4.84 MIL/uL (ref 3.87–5.11)
RDW: 15.4 % (ref 11.5–15.5)
WBC: 10.1 10*3/uL (ref 4.0–10.5)

## 2017-08-20 LAB — URINALYSIS, ROUTINE W REFLEX MICROSCOPIC
Bilirubin Urine: NEGATIVE
Glucose, UA: NEGATIVE mg/dL
Hgb urine dipstick: NEGATIVE
KETONES UR: NEGATIVE mg/dL
LEUKOCYTES UA: NEGATIVE
NITRITE: NEGATIVE
PROTEIN: NEGATIVE mg/dL
Specific Gravity, Urine: 1.016 (ref 1.005–1.030)
pH: 5 (ref 5.0–8.0)

## 2017-08-20 LAB — LIPASE, BLOOD: Lipase: 32 U/L (ref 11–51)

## 2017-08-20 LAB — I-STAT BETA HCG BLOOD, ED (MC, WL, AP ONLY)

## 2017-08-20 LAB — CBG MONITORING, ED: GLUCOSE-CAPILLARY: 128 mg/dL — AB (ref 65–99)

## 2017-08-20 MED ORDER — PROCHLORPERAZINE EDISYLATE 10 MG/2ML IJ SOLN
10.0000 mg | Freq: Once | INTRAMUSCULAR | Status: AC
  Administered 2017-08-20: 10 mg via INTRAVENOUS
  Filled 2017-08-20: qty 2

## 2017-08-20 MED ORDER — DIPHENHYDRAMINE HCL 50 MG/ML IJ SOLN
25.0000 mg | Freq: Once | INTRAMUSCULAR | Status: AC
  Administered 2017-08-20: 25 mg via INTRAVENOUS
  Filled 2017-08-20: qty 1

## 2017-08-20 MED ORDER — ONDANSETRON 4 MG PO TBDP
4.0000 mg | ORAL_TABLET | Freq: Once | ORAL | Status: AC | PRN
  Administered 2017-08-20: 4 mg via ORAL
  Filled 2017-08-20: qty 1

## 2017-08-20 MED ORDER — SODIUM CHLORIDE 0.9 % IV BOLUS
1000.0000 mL | Freq: Once | INTRAVENOUS | Status: AC
  Administered 2017-08-20: 1000 mL via INTRAVENOUS

## 2017-08-20 NOTE — ED Notes (Signed)
Pt ambulatory leaving with daughter at side

## 2017-08-20 NOTE — ED Triage Notes (Signed)
States went to bed at 1130 pm, and c/o dizziness and headache c/o nausea. States her head has eased off just c/o nausea at present.

## 2017-08-20 NOTE — Discharge Instructions (Signed)
Please follow-up with your primary care physician for reassessment and further management.  If any symptoms change or worsen, please return to the nearest emergency department.  Please stay hydrated.

## 2017-08-20 NOTE — ED Notes (Signed)
Pt states she is leaving.  Pt requests IV removal.  MD notified.

## 2017-08-20 NOTE — ED Notes (Signed)
Pt's visitor came to nurses station states pt feels like she is going to pass out and needs someone in there right now.  This RN went into room to check on pt who removed monitoring leads and was sitting in the chair next to the stretcher. Pt yells at this RN states "I have been up here all fucking day, what is taking you people so long to do anything,  I have things to do too, I'm never coming up here again"  Apologized to pt for the wait.

## 2017-08-20 NOTE — ED Provider Notes (Signed)
[facility] EMERGENCY DEPARTMENT Provider Note   CSN: [id number] Arrival date & time: 08/20/17  5009     History   Chief Complaint Chief Complaint  Patient presents with  . Dizziness  . Headache  . Nausea    HPI Melissa Valdez is a 52 y.o. female.  The history is provided by the patient and medical records. No language interpreter was used.  Headache   This is a new problem. The current episode started yesterday. The problem occurs constantly. The problem has been gradually worsening. The headache is associated with bright light and loud noise. The pain is located in the temporal region. The quality of the pain is described as throbbing and dull. The pain is at a severity of 6/10. The pain is moderate. The pain does not radiate. Associated symptoms include shortness of breath (cough) and nausea. Pertinent negatives include no fever, no malaise/fatigue, no chest pressure, no palpitations, no syncope and no vomiting. She has tried NSAIDs for the symptoms. The treatment provided no relief.    Past Medical History:  Diagnosis Date  . Diabetes mellitus without complication ([place])   . GERD (gastroesophageal reflux disease)   . Hypertension   . Obesity, Class III, BMI 40-49.9 (morbid obesity) ([place])   . Sleep apnea    cpap-     Patient Active Problem List   Diagnosis Date Noted  . Dyspnea 05/09/2017  . Goiter 05/09/2017  . Cellulitis and abscess of hand, except fingers and thumb 10/28/2014  . Hypokalemia 10/28/2014  . Hyperglycemia 10/28/2014  . Diabetes mellitus type II, controlled, with no complications ([place]) 38/18/2993  . Hypertension 10/28/2014  . Morbid obesity with BMI of 40.0-44.9, adult ([place]) 06/28/2014  . Dysmenorrhea 01/20/2014  . Abnormal uterine bleeding (AUB) 01/20/2014  . Fibroids, intramural 01/19/2014    Past Surgical History:  Procedure Laterality Date  . boil on leg removed   1980s  . TUBAL LIGATION  1999     OB History    Gravida  2    Para  2   Term  2   Preterm      AB      Living  2     SAB      TAB      Ectopic      Multiple      Live Births  2            Home Medications    Prior to Admission medications   Medication Sig Start Date End Date Taking? Authorizing Provider  ACCU-CHEK AVIVA PLUS test strip  04/26/14   [provider]  amLODipine (NORVASC) 10 MG tablet Take 1 tablet (10 mg total) by mouth daily. 03/06/17   Clent Demark, PA-C  diphenhydrAMINE (BENADRYL) 25 mg capsule Take 50 mg by mouth every 6 (six) hours as needed for sleep.    [provider]  glimepiride (AMARYL) 2 MG tablet TAKE 1 TABLET BY MOUTH DAILY BEFORE BREAKFAST 06/21/17   Clent Demark, PA-C  hydrochlorothiazide (HYDRODIURIL) 25 MG tablet Take 1 tablet (25 mg total) by mouth daily. Take on tablet in the morning. 03/06/17   Clent Demark, PA-C  hydroxypropyl methylcellulose / hypromellose (ISOPTO TEARS / GONIOVISC) 2.5 % ophthalmic solution Place 2 drops into both eyes daily as needed for dry eyes.    [provider]  ibuprofen (ADVIL,MOTRIN) 200 MG tablet Take 600 mg by mouth every 6 (six) hours as needed for moderate pain.  [provider]  metFORMIN (GLUCOPHAGE) 1000 MG tablet Take 1 tablet (1,000 mg total) by mouth 2 (two) times daily with a meal. 02/06/17   Clent Demark, PA-C  omeprazole (PRILOSEC) 20 MG capsule Take 1 capsule (20 mg total) by mouth daily. 01/16/17   Doristine Devoid, PA-C  simvastatin (ZOCOR) 10 MG tablet Take 1 tablet (10 mg total) by mouth daily. 01/16/17   Doristine Devoid, PA-C    Family History Family History  Problem Relation Age of Onset  . Hypertension Mother   . Diabetes Father   . Goiter Sister     Social History Social History   Tobacco Use  . Smoking status: Current Every Day Smoker    Packs/day: 0.25    Types: Cigarettes  . Smokeless tobacco: Never Used  Substance Use Topics  . Alcohol use: Yes    Alcohol/week:  1.8 oz    Types: 2 Cans of beer, 1 Shots of liquor per week  . Drug use: No     Allergies   Patient has no known allergies.   Review of Systems Review of Systems  Constitutional: Negative for chills, diaphoresis, fatigue, fever and malaise/fatigue.  HENT: Negative for congestion.   Eyes: Positive for photophobia. Negative for visual disturbance.  Respiratory: Positive for cough and shortness of breath (cough). Negative for chest tightness and wheezing.   Cardiovascular: Negative for chest pain, palpitations, leg swelling and syncope.  Gastrointestinal: Positive for nausea. Negative for abdominal pain, constipation, diarrhea and vomiting.  Genitourinary: Negative for dysuria, flank pain and frequency.  Musculoskeletal: Negative for back pain, neck pain and neck stiffness.  Skin: Negative for rash and wound.  Neurological: Positive for light-headedness and headaches. Negative for speech difficulty, weakness and numbness.  Psychiatric/Behavioral: Negative for agitation and confusion.  All other systems reviewed and are negative.    Physical Exam Updated Vital Signs BP 126/77   Pulse 70   Temp 98.2 F (36.8 C) (Oral)   Resp 20   Ht 5' (1.524 m)   Wt 101.6 kg (224 lb)   SpO2 97%   BMI 43.75 kg/m   Physical Exam  Constitutional: She is oriented to person, place, and time. She appears well-developed and well-nourished.  Non-toxic appearance. She does not appear ill. No distress.  HENT:  Head: Normocephalic and atraumatic.  Nose: Nose normal.  Mouth/Throat: Oropharynx is clear and moist. No oropharyngeal exudate.  Eyes: Pupils are equal, round, and reactive to light. Conjunctivae and EOM are normal.  Neck: Normal range of motion.  Cardiovascular: Normal rate, normal heart sounds and intact distal pulses.  No murmur heard. Pulmonary/Chest: Effort normal and breath sounds normal. No stridor. No respiratory distress. She has no wheezes. She exhibits no tenderness.  Abdominal:  Soft. Bowel sounds are normal. She exhibits no distension. There is no tenderness.  Musculoskeletal: She exhibits no edema or tenderness.  Lymphadenopathy:    She has no cervical adenopathy.  Neurological: She is alert and oriented to person, place, and time. No cranial nerve deficit or sensory deficit. She exhibits normal muscle tone. Coordination normal.  Skin: Capillary refill takes less than 2 seconds. No rash noted. She is not diaphoretic. No erythema.  Psychiatric: She has a normal mood and affect.  Nursing note and vitals reviewed.    ED Treatments / Results  Labs (all labs ordered are listed, but only abnormal results are displayed) Labs Reviewed  COMPREHENSIVE METABOLIC PANEL - Abnormal; Notable for the following components:  Result Value   Glucose, Bld 174 (*)    Calcium 8.3 (*)    Albumin 3.3 (*)    All other components within normal limits  CBG MONITORING, ED - Abnormal; Notable for the following components:   Glucose-Capillary 128 (*)    All other components within normal limits  URINE CULTURE  LIPASE, BLOOD  CBC  URINALYSIS, ROUTINE W REFLEX MICROSCOPIC  I-STAT BETA HCG BLOOD, ED ([facility], [facility], [facility] ONLY)  CBG MONITORING, ED    EKG None  Radiology Dg Chest 2 View  Result Date: 08/20/2017 CLINICAL DATA:  52 year old female with cough, lightheadedness, chills, hypertensive. EXAM: CHEST - 2 VIEW COMPARISON:  10/28/2014 chest radiographs and earlier. FINDINGS: AP and lateral views of the chest. Lower lung volumes. Mild cardiomegaly which is new or increased since 2016. Other mediastinal contours are within normal limits. Visualized tracheal air column is within normal limits. Crowding of lung markings and vascular congestion without overt edema. No pneumothorax, pleural effusion or confluent pulmonary opacity. No acute osseous abnormality identified. Negative visible bowel gas pattern. IMPRESSION: Mild cardiomegaly appears new or increased since 2016. Lower lung volumes  with crowding and vascular congestion but no overt pulmonary edema. Electronically Signed   By: Genevie Ann M.D.   On: 08/20/2017 14:37    Procedures Procedures (including critical care time)  Medications Ordered in ED Medications  ondansetron (ZOFRAN-ODT) disintegrating tablet 4 mg (4 mg Oral Given 08/20/17 0526)  prochlorperazine (COMPAZINE) injection 10 mg (10 mg Intravenous Given 08/20/17 1403)  diphenhydrAMINE (BENADRYL) injection 25 mg (25 mg Intravenous Given 08/20/17 1403)  sodium chloride 0.9 % bolus 1,000 mL (0 mLs Intravenous Stopped 08/20/17 1554)     Initial Impression / Assessment and Plan / ED Course  I have reviewed the triage vital signs and the nursing notes.  Pertinent labs & imaging results that were available during my care of the patient were reviewed by me and considered in my medical decision making (see chart for details).     Melissa Valdez is a 52 y.o. female with past medical history significant for hypertension, diabetes, GERD, prior headaches who presents with headache, nausea, lightheadedness, cough, shortness of breath.  Patient reports that starting 11:30 PM last night, she started having severe headaches.  She reports that it was associated with a throbbing pain across the front of her head.  It was associated with photophobia and phonophobia.  She reports that she has not had headache like this for several months.  She does report a history of severe headaches.  She reports is a squeezing pain.  She denies any vision changes but does report some nausea.  No vomiting.  She reports feeling lightheaded and has not had anything to eat or drink today due to the headache and nausea.  She denies any neck pain or neck stiffness.  She denies any fevers or chills.  She reports she has had a cough for the last few days with some mild shortness of breath with the cough.  No chest pain or palpitations.  No urinary symptoms, conservation, or diarrhea.  Patient reports she took some  ibuprofen which did not help her symptoms.  She denies any recent trauma.  She describes the headache as a 5 out of 10 severity initially.  On exam, no focal neurologic deficits were seen.  Normal neck range of motion without nuchal rigidity.  Lungs clear and chest nontender.  Abdomen nontender.  Normal pulses in all extremities.  Normal strength and sensation in upper  and lower extremities.  Patient otherwise appears well.  Patient work-up to look for occult infection and will be given fluids for possible dehydration.  Patient was given a headache cocktail as her symptoms sound migrainous in nature.  At this time do not feel patient needs a head CT.  Will reassess after medications and work-up.  4:03 PM Patient's work-up revealed no evidence of UTI no pneumonia.  Laboratory testing was overall reassuring.  According to nursing, the headache cocktail complete resolve the patient's headache and she needed to leave the emergency department.  Before I was able to speak with her, patient eloped since she was feeling so much better.  Suspect patient had dehydration and a migraine that was resolved with her medications.  Given patient's reassuring exam and reassuring work-up, I do not feel patient needed further work-up prior to elopement however patient will likely to follow-up with your primary doctor in several days.  Patient eloped in Good condition.  Final Clinical Impressions(s) / ED Diagnoses   Final diagnoses:  Bad headache     Clinical Impression: 1. Bad headache     Disposition: Eloped  Condition: Good   Discharge Medication List as of 08/20/2017  4:02 PM      Follow Up: No follow-up provider specified.    Tegeler, Gwenyth Allegra, MD 08/20/17 [phone]

## 2017-08-20 NOTE — ED Notes (Signed)
EDP at bedside  

## 2017-08-21 LAB — URINE CULTURE

## 2017-11-08 ENCOUNTER — Encounter: Payer: Self-pay | Admitting: Endocrinology

## 2017-11-08 ENCOUNTER — Ambulatory Visit (INDEPENDENT_AMBULATORY_CARE_PROVIDER_SITE_OTHER): Payer: BC Managed Care – PPO | Admitting: Endocrinology

## 2017-11-08 VITALS — BP 144/88 | HR 93 | Ht 60.0 in | Wt 221.4 lb

## 2017-11-08 DIAGNOSIS — E049 Nontoxic goiter, unspecified: Secondary | ICD-10-CM | POA: Diagnosis not present

## 2017-11-08 NOTE — Patient Instructions (Addendum)
blood tests are requested for you today.  We'll let you know about the results.   If it is normal, please come back for a follow-up appointment in 6 months.    

## 2017-11-08 NOTE — Progress Notes (Signed)
Subjective:    Patient ID: Melissa Valdez, female    DOB: [birth year], 52 y.o.   MRN: [id number]  HPI Pt returns for f/u of large multinodular goiter (dx'ed 2018; she repored sob, but spirometry did not show obstruction; [place] in 2018 showed; no nodule corresponds to the nonspecific appearance of the nuclear medicine study; Multiple nodules meet criteria for biopsy; bx was [facility] Cat 2).  Pt says the goiter is unchanged.  Past Medical History:  Diagnosis Date  . Diabetes mellitus without complication ([place])   . GERD (gastroesophageal reflux disease)   . Hypertension   . Obesity, Class III, BMI 40-49.9 (morbid obesity) ([place])   . Sleep apnea    cpap-     Past Surgical History:  Procedure Laterality Date  . boil on leg removed   1980s  . TUBAL LIGATION  1999    Social History   Socioeconomic History  . Marital status: Single    Spouse name: Not on file  . Number of children: Not on file  . Years of education: Not on file  . Highest education level: Not on file  Occupational History  . Not on file  Social Needs  . Financial resource strain: Not on file  . Food insecurity:    Worry: Not on file    Inability: Not on file  . Transportation needs:    Medical: Not on file    Non-medical: Not on file  Tobacco Use  . Smoking status: Current Every Day Smoker    Packs/day: 0.25    Types: Cigarettes  . Smokeless tobacco: Never Used  Substance and Sexual Activity  . Alcohol use: Yes    Alcohol/week: 1.8 oz    Types: 2 Cans of beer, 1 Shots of liquor per week  . Drug use: No  . Sexual activity: Yes    Partners: Male    Birth control/protection: Surgical    Comment: Last Encnounter 4 days ago  Lifestyle  . Physical activity:    Days per week: Not on file    Minutes per session: Not on file  . Stress: Not on file  Relationships  . Social connections:    Talks on phone: Not on file    Gets together: Not on file    Attends religious service: Not on file    Active member of club  or organization: Not on file    Attends meetings of clubs or organizations: Not on file    Relationship status: Not on file  . Intimate partner violence:    Fear of current or ex partner: Not on file    Emotionally abused: Not on file    Physically abused: Not on file    Forced sexual activity: Not on file  Other Topics Concern  . Not on file  Social History Narrative  . Not on file    Current Outpatient Medications on File Prior to Visit  Medication Sig Dispense Refill  . ACCU-CHEK AVIVA PLUS test strip   9  . acetaminophen (TYLENOL) 500 MG tablet Take 1,000 mg by mouth as needed for mild pain.    Marland Kitchen amLODipine (NORVASC) 10 MG tablet Take 1 tablet (10 mg total) by mouth daily. 90 tablet 1  . diphenhydrAMINE (BENADRYL) 25 mg capsule Take 50 mg by mouth every 6 (six) hours as needed for sleep.    Marland Kitchen glimepiride (AMARYL) 2 MG tablet TAKE 1 TABLET BY MOUTH DAILY BEFORE BREAKFAST 30 tablet 0  . hydrochlorothiazide (HYDRODIURIL) 25 MG  tablet Take 1 tablet (25 mg total) by mouth daily. Take on tablet in the morning. 90 tablet 1  . hydroxypropyl methylcellulose / hypromellose (ISOPTO TEARS / GONIOVISC) 2.5 % ophthalmic solution Place 2 drops into both eyes daily as needed for dry eyes.    Marland Kitchen ibuprofen (ADVIL,MOTRIN) 200 MG tablet Take 600 mg by mouth every 6 (six) hours as needed for moderate pain.    . metFORMIN (GLUCOPHAGE) 1000 MG tablet Take 1 tablet (1,000 mg total) by mouth 2 (two) times daily with a meal. 180 tablet 3  . omeprazole (PRILOSEC) 20 MG capsule Take 1 capsule (20 mg total) by mouth daily. 30 capsule 0  . simvastatin (ZOCOR) 10 MG tablet Take 1 tablet (10 mg total) by mouth daily. 30 tablet 0   No current facility-administered medications on file prior to visit.     No Known Allergies  Family History  Problem Relation Age of Onset  . Hypertension Mother   . Diabetes Father   . Goiter Sister     BP (!) 144/88 (BP Location: Left Arm, Patient Position: Sitting, Cuff Size:  Normal)   Pulse 93   Ht 5' (1.524 m)   Wt 221 lb 6.4 oz (100.4 kg)   SpO2 96%   BMI 43.24 kg/m   Review of Systems No change in chronic sob.      Objective:   Physical Exam VITAL SIGNS:  See vs page GENERAL: no distress NECK: 10 x normal size goiter is again noted.  Diffuse, with smooth surface.     Lab Results  Component Value Date   TSH 0.627 02/06/2017   T3TOTAL 124 01/16/2017   T4TOTAL 7.6 02/06/2017      Assessment & Plan:  Large multinodular goiter, due for recheck of TFT.  We discussed.  If TSH is low, she chooses [facility].    Patient Instructions  blood tests are requested for you today.  We'll let you know about the results. If it is normal, please come back for a follow-up appointment in 6 months.

## 2017-11-21 ENCOUNTER — Ambulatory Visit (HOSPITAL_COMMUNITY)
Admission: EM | Admit: 2017-11-21 | Discharge: 2017-11-21 | Disposition: A | Discharge status: Home / Self Care | Payer: BC Managed Care – PPO | Attending: Family Medicine | Admitting: Family Medicine

## 2017-11-21 ENCOUNTER — Encounter (HOSPITAL_COMMUNITY): Payer: Self-pay | Admitting: Emergency Medicine

## 2017-11-21 DIAGNOSIS — M1711 Unilateral primary osteoarthritis, right knee: Secondary | ICD-10-CM | POA: Diagnosis not present

## 2017-11-21 DIAGNOSIS — M25561 Pain in right knee: Secondary | ICD-10-CM | POA: Diagnosis not present

## 2017-11-21 MED ORDER — KETOROLAC TROMETHAMINE 30 MG/ML IJ SOLN
INTRAMUSCULAR | Status: AC
Start: 2017-11-21 — End: 2017-11-21
  Filled 2017-11-21: qty 1

## 2017-11-21 MED ORDER — KETOROLAC TROMETHAMINE 30 MG/ML IJ SOLN
30.0000 mg | Freq: Once | INTRAMUSCULAR | Status: AC
  Administered 2017-11-21: 30 mg via INTRAMUSCULAR

## 2017-11-21 MED ORDER — NAPROXEN 250 MG PO TABS: 500.0000 mg | ORAL_TABLET | Freq: Two times a day (BID) | ORAL | 0 refills | Status: AC

## 2017-11-21 NOTE — ED Triage Notes (Signed)
Pt states since last night both her knees have been bothering her, hx of "fluid buildup in my knees". Denies injury.

## 2017-11-21 NOTE — Discharge Instructions (Signed)
Ice and elevation of the knee, especially after increased activity.  ACE wrap for support and compression. Naproxen twice a day, first dose tonight. Don't take with additional ibuprofen and take with food.  Please follow up with orthopedics for any further evaluation for persistent symptoms.

## 2017-11-21 NOTE — ED Provider Notes (Signed)
[facility]    CSN: [id number] Arrival date & time: 11/21/17  1237     History   Chief Complaint Chief Complaint  Patient presents with  . Knee Pain    HPI Goldy Calandra is a 52 y.o. female.   Anayelli presents with complaints of right knee pain which started last night. History of bilateral knee pain, states has seen orthopedics and has been told the "cartilage is gone."  No new injury. Has been working cleaning buses prior to school starting, has been kneeling on knees and getting up and down. Took ibuprofen this am which helped temporarily. Pain 8/10. Worse with weight bearing and with flexion of the knee. Hx of neuropathy, no new numbness or tingling. Hx of dm, gerd, htn.     ROS per HPI.      Past Medical History:  Diagnosis Date  . Diabetes mellitus without complication ([place])   . GERD (gastroesophageal reflux disease)   . Hypertension   . Obesity, Class III, BMI 40-49.9 (morbid obesity) ([place])   . Sleep apnea    cpap-     Patient Active Problem List   Diagnosis Date Noted  . Dyspnea 05/09/2017  . Goiter 05/09/2017  . Cellulitis and abscess of hand, except fingers and thumb 10/28/2014  . Hypokalemia 10/28/2014  . Hyperglycemia 10/28/2014  . Diabetes mellitus type II, controlled, with no complications ([place]) 44/06/4740  . Hypertension 10/28/2014  . Morbid obesity with BMI of 40.0-44.9, adult ([place]) 06/28/2014  . Dysmenorrhea 01/20/2014  . Abnormal uterine bleeding (AUB) 01/20/2014  . Fibroids, intramural 01/19/2014    Past Surgical History:  Procedure Laterality Date  . boil on leg removed   1980s  . TUBAL LIGATION  1999    OB History    Gravida  2   Para  2   Term  2   Preterm      AB      Living  2     SAB      TAB      Ectopic      Multiple      Live Births  2            Home Medications    Prior to Admission medications   Medication Sig Start Date End Date Taking? Authorizing Provider  ACCU-CHEK AVIVA PLUS  test strip  04/26/14   [provider]  acetaminophen (TYLENOL) 500 MG tablet Take 1,000 mg by mouth as needed for mild pain.    [provider]  amLODipine (NORVASC) 10 MG tablet Take 1 tablet (10 mg total) by mouth daily. 03/06/17   Clent Demark, PA-C  diphenhydrAMINE (BENADRYL) 25 mg capsule Take 50 mg by mouth every 6 (six) hours as needed for sleep.    [provider]  glimepiride (AMARYL) 2 MG tablet TAKE 1 TABLET BY MOUTH DAILY BEFORE BREAKFAST 06/21/17   Clent Demark, PA-C  hydrochlorothiazide (HYDRODIURIL) 25 MG tablet Take 1 tablet (25 mg total) by mouth daily. Take on tablet in the morning. 03/06/17   Clent Demark, PA-C  hydroxypropyl methylcellulose / hypromellose (ISOPTO TEARS / GONIOVISC) 2.5 % ophthalmic solution Place 2 drops into both eyes daily as needed for dry eyes.    [provider]  ibuprofen (ADVIL,MOTRIN) 200 MG tablet Take 600 mg by mouth every 6 (six) hours as needed for moderate pain.    [provider]  metFORMIN (GLUCOPHAGE) 1000 MG tablet Take 1 tablet (1,000 mg total) by mouth  2 (two) times daily with a meal. 02/06/17   Clent Demark, PA-C  naproxen (NAPROSYN) 250 MG tablet Take 2 tablets (500 mg total) by mouth 2 (two) times daily with a meal for 20 days. 11/21/17 12/11/17  Zigmund Gottron, NP  omeprazole (PRILOSEC) 20 MG capsule Take 1 capsule (20 mg total) by mouth daily. 01/16/17   Doristine Devoid, PA-C  simvastatin (ZOCOR) 10 MG tablet Take 1 tablet (10 mg total) by mouth daily. 01/16/17   Doristine Devoid, PA-C    Family History Family History  Problem Relation Age of Onset  . Hypertension Mother   . Diabetes Father   . Goiter Sister     Social History Social History   Tobacco Use  . Smoking status: Current Every Day Smoker    Packs/day: 0.25    Types: Cigarettes  . Smokeless tobacco: Never Used  Substance Use Topics  . Alcohol use: Yes    Alcohol/week: 3.0 standard drinks     Types: 2 Cans of beer, 1 Shots of liquor per week  . Drug use: No     Allergies   Patient has no known allergies.   Review of Systems Review of Systems   Physical Exam Triage Vital Signs ED Triage Vitals [11/21/17 1245]  Enc Vitals Group     BP (!) 177/92     Pulse Rate 82     Resp 16     Temp 98.1 F (36.7 C)     Temp src      SpO2 97 %     Weight      Height      Head Circumference      Peak Flow      Pain Score      Pain Loc      Pain Edu?      Excl. in [place]?    No data found.  Updated Vital Signs BP (!) 177/92   Pulse 82   Temp 98.1 F (36.7 C)   Resp 16   SpO2 97%    Physical Exam  Constitutional: She is oriented to person, place, and time. She appears well-developed and well-nourished. No distress.  Cardiovascular: Normal rate, regular rhythm and normal heart sounds.  Pulmonary/Chest: Effort normal and breath sounds normal.  Musculoskeletal:       Right knee: She exhibits effusion. She exhibits normal range of motion, no swelling, no ecchymosis, no deformity, no laceration, no erythema, normal alignment, no LCL laxity, normal patellar mobility, no bony tenderness, normal meniscus and no MCL laxity. Tenderness found. Patellar tendon tenderness noted.  Ambulatory with minimal gait change noted; increased pain with flexion of knee; generalized pain on palpation but primarily to patellar tendon   Neurological: She is alert and oriented to person, place, and time.  Skin: Skin is warm and dry.     UC Treatments / Results  Labs (all labs ordered are listed, but only abnormal results are displayed) Labs Reviewed - No data to display  EKG None  Radiology No results found.  Procedures Procedures (including critical care time)  Medications Ordered in UC Medications  ketorolac (TORADOL) 30 MG/ML injection 30 mg (has no administration in time range)    Initial Impression / Assessment and Plan / UC Course  I have reviewed the triage vital signs and  the nursing notes.  Pertinent labs & imaging results that were available during my care of the patient were reviewed by me and considered in my medical decision  making (see chart for details).     No new injury. Repetitive activity on the knee with flair of known arthritis likely. toradol provided today, ace wrap for compression. Follow up with orthopedics for persistent symptoms. Patient verbalized understanding and agreeable to plan.  Ambulatory out of clinic without difficulty.    Final Clinical Impressions(s) / UC Diagnoses   Final diagnoses:  Right knee pain, unspecified chronicity  Arthritis of right knee     Discharge Instructions     Ice and elevation of the knee, especially after increased activity.  ACE wrap for support and compression. Naproxen twice a day, first dose tonight. Don't take with additional ibuprofen and take with food.  Please follow up with orthopedics for any further evaluation for persistent symptoms.    ED Prescriptions    Medication Sig Dispense Auth. Provider   naproxen (NAPROSYN) 250 MG tablet Take 2 tablets (500 mg total) by mouth 2 (two) times daily with a meal for 20 days. 80 tablet Zigmund Gottron, NP     Controlled Substance Prescriptions [place] Controlled Substance Registry consulted? Not Applicable   Zigmund Gottron, NP 11/21/17 1312

## 2018-01-25 ENCOUNTER — Encounter (HOSPITAL_COMMUNITY): Payer: Self-pay

## 2018-01-25 ENCOUNTER — Other Ambulatory Visit: Payer: Self-pay

## 2018-01-25 ENCOUNTER — Ambulatory Visit (HOSPITAL_COMMUNITY)
Admission: EM | Admit: 2018-01-25 | Discharge: 2018-01-25 | Disposition: A | Discharge status: Home / Self Care | Payer: BC Managed Care – PPO | Attending: Family Medicine | Admitting: Family Medicine

## 2018-01-25 DIAGNOSIS — S0502XA Injury of conjunctiva and corneal abrasion without foreign body, left eye, initial encounter: Secondary | ICD-10-CM | POA: Diagnosis not present

## 2018-01-25 DIAGNOSIS — H5712 Ocular pain, left eye: Secondary | ICD-10-CM

## 2018-01-25 DIAGNOSIS — I10 Essential (primary) hypertension: Secondary | ICD-10-CM | POA: Diagnosis not present

## 2018-01-25 MED ORDER — OFLOXACIN 0.3 % OP SOLN: OPHTHALMIC | 0 refills | Status: DC

## 2018-01-25 MED ORDER — TETRACAINE HCL 0.5 % OP SOLN
OPHTHALMIC | Status: AC
Start: 2018-01-25 — End: 2018-01-25
  Filled 2018-01-25: qty 4

## 2018-01-25 MED ORDER — FLUORESCEIN SODIUM 1 MG OP STRP
ORAL_STRIP | OPHTHALMIC | Status: AC
  Filled 2018-01-25: qty 1

## 2018-01-25 MED ORDER — POLYETHYL GLYCOL-PROPYL GLYCOL 0.4-0.3 % OP GEL: 1.0000 "application " | Freq: Every evening | OPHTHALMIC | 0 refills | Status: DC | PRN

## 2018-01-25 NOTE — ED Triage Notes (Signed)
Pt presents to [facility] for eye pain, pt states she woke up this morning to a swollen ad painful eye, pt denies any injuries

## 2018-01-25 NOTE — Discharge Instructions (Signed)
Ofloxacin eyedrops as directed.  Artificial tear gel at night. Wait 10-15 minutes between drops, always use artificial tear gel last, as it prevents drops from penetrating through.  You can put both in the fridge to help soothe the eye.  Monitor for any worsening of symptoms, changes in vision, sensitivity to light, eye swelling, painful eye movement, follow up with ophthalmology for further evaluation.  Please follow-up with ophthalmology on Monday to ensure well-healing abrasion.

## 2018-01-25 NOTE — ED Provider Notes (Signed)
[facility]    CSN: [id number] Arrival date & time: 01/25/18  1315     History   Chief Complaint Chief Complaint  Patient presents with  . Eye Pain    HPI Melissa Valdez is a 52 y.o. female.   52 year old female comes in with 1 day history of left eye pain.  States she woke up with the eye pain/soreness.  Denies blurry vision, eye crusting, vision changes.  She does notice consistent tearing since symptom onset.  Denies photophobia.  Denies contact lens use, does wear glasses.  Denies injury to the eye.  Denies URI symptoms such as cough, congestion, sore throat.  Has been applying ice compress to the eye without much relief.     Past Medical History:  Diagnosis Date  . Diabetes mellitus without complication ([place])   . GERD (gastroesophageal reflux disease)   . Hypertension   . Obesity, Class III, BMI 40-49.9 (morbid obesity) ([place])   . Sleep apnea    cpap-     Patient Active Problem List   Diagnosis Date Noted  . Dyspnea 05/09/2017  . Goiter 05/09/2017  . Cellulitis and abscess of hand, except fingers and thumb 10/28/2014  . Hypokalemia 10/28/2014  . Hyperglycemia 10/28/2014  . Diabetes mellitus type II, controlled, with no complications ([place]) 67/89/3810  . Hypertension 10/28/2014  . Morbid obesity with BMI of 40.0-44.9, adult ([place]) 06/28/2014  . Dysmenorrhea 01/20/2014  . Abnormal uterine bleeding (AUB) 01/20/2014  . Fibroids, intramural 01/19/2014    Past Surgical History:  Procedure Laterality Date  . boil on leg removed   1980s  . TUBAL LIGATION  1999    OB History    Gravida  2   Para  2   Term  2   Preterm      AB      Living  2     SAB      TAB      Ectopic      Multiple      Live Births  2            Home Medications    Prior to Admission medications   Medication Sig Start Date End Date Taking? Authorizing Provider  amLODipine (NORVASC) 10 MG tablet Take 1 tablet (10 mg total) by mouth daily. 03/06/17  Yes  Clent Demark, PA-C  glimepiride (AMARYL) 2 MG tablet TAKE 1 TABLET BY MOUTH DAILY BEFORE BREAKFAST 06/21/17  Yes Clent Demark, PA-C  hydrochlorothiazide (HYDRODIURIL) 25 MG tablet Take 1 tablet (25 mg total) by mouth daily. Take on tablet in the morning. 03/06/17  Yes Clent Demark, PA-C  hydroxypropyl methylcellulose / hypromellose (ISOPTO TEARS / GONIOVISC) 2.5 % ophthalmic solution Place 2 drops into both eyes daily as needed for dry eyes.   Yes [provider]  metFORMIN (GLUCOPHAGE) 1000 MG tablet Take 1 tablet (1,000 mg total) by mouth 2 (two) times daily with a meal. 02/06/17  Yes Clent Demark, PA-C  omeprazole (PRILOSEC) 20 MG capsule Take 1 capsule (20 mg total) by mouth daily. 01/16/17  Yes Doristine Devoid, PA-C  simvastatin (ZOCOR) 10 MG tablet Take 1 tablet (10 mg total) by mouth daily. 01/16/17  Yes Doristine Devoid, PA-C  ACCU-CHEK AVIVA PLUS test strip  04/26/14   [provider]  acetaminophen (TYLENOL) 500 MG tablet Take 1,000 mg by mouth as needed for mild pain.    [provider]  diphenhydrAMINE (BENADRYL) 25 mg capsule Take 50  mg by mouth every 6 (six) hours as needed for sleep.    [provider]  ibuprofen (ADVIL,MOTRIN) 200 MG tablet Take 600 mg by mouth every 6 (six) hours as needed for moderate pain.    [provider]  ofloxacin (OCUFLOX) 0.3 % ophthalmic solution 1 drop in left eye every 4 hours for the first 2 days, then 4 times a day for the next 5 days. 01/25/18   Tasia Catchings, Amy V, PA-C  Polyethyl Glycol-Propyl Glycol (SYSTANE) 0.4-0.3 % GEL ophthalmic gel Place 1 application into both eyes at bedtime as needed. 01/25/18   Ok Edwards, PA-C    Family History Family History  Problem Relation Age of Onset  . Hypertension Mother   . Diabetes Father   . Goiter Sister     Social History Social History   Tobacco Use  . Smoking status: Current Every Day Smoker    Packs/day: 0.25    Types: Cigarettes  .  Smokeless tobacco: Never Used  Substance Use Topics  . Alcohol use: Yes    Alcohol/week: 3.0 standard drinks    Types: 2 Cans of beer, 1 Shots of liquor per week  . Drug use: No     Allergies   Patient has no known allergies.   Review of Systems Review of Systems  Reason unable to perform ROS: See HPI as above.     Physical Exam Triage Vital Signs ED Triage Vitals [01/25/18 1357]  Enc Vitals Group     BP (!) 165/95     Pulse Rate 75     Resp 17     Temp 97.7 F (36.5 C)     Temp Source Oral     SpO2      Weight      Height      Head Circumference      Peak Flow      Pain Score 10     Pain Loc      Pain Edu?      Excl. in [place]?    No data found.  Updated Vital Signs BP (!) 165/95 (BP Location: Left Arm)   Pulse 75   Temp 97.7 F (36.5 C) (Oral)   Resp 17   LMP  (LMP Unknown)   Visual Acuity (without glasses) Right Eye Distance: 20/100 Left Eye Distance: 20/200 Bilateral Distance: 20/200  Right Eye Near:   Left Eye Near:    Bilateral Near:     Physical Exam  Constitutional: She is oriented to person, place, and time. She appears well-developed and well-nourished. No distress.  HENT:  Head: Normocephalic and atraumatic.  Eyes: Pupils are equal, round, and reactive to light. EOM and lids are normal. Lids are everted and swept, no foreign bodies found. Right conjunctiva is not injected. Left conjunctiva is not injected.  Fluorescein stain showed 0.3 cm laceration at 6 o'clock region of iris, does not extend to the pupil.  Negative Seidel sign.  Neurological: She is alert and oriented to person, place, and time.     UC Treatments / Results  Labs (all labs ordered are listed, but only abnormal results are displayed) Labs Reviewed - No data to display  EKG None  Radiology No results found.  Procedures Procedures (including critical care time)  Medications Ordered in UC Medications - No data to display  Initial Impression / Assessment and Plan  / UC Course  I have reviewed the triage vital signs and the nursing notes.  Pertinent labs &  imaging results that were available during my care of the patient were reviewed by me and considered in my medical decision making (see chart for details).    History and exam consistent with corneal abrasion.  Ofloxacin as directed.  Artificial tear gel as directed.  Patient to follow-up with ophthalmology on Monday for further evaluation and management needed.  Return precautions given.  Patient expresses understanding and agrees to plan.  Final Clinical Impressions(s) / UC Diagnoses   Final diagnoses:  Abrasion of left cornea, initial encounter    ED Prescriptions    Medication Sig Dispense Auth. Provider   ofloxacin (OCUFLOX) 0.3 % ophthalmic solution 1 drop in left eye every 4 hours for the first 2 days, then 4 times a day for the next 5 days. 5 mL Yu, Amy V, PA-C   Polyethyl Glycol-Propyl Glycol (SYSTANE) 0.4-0.3 % GEL ophthalmic gel Place 1 application into both eyes at bedtime as needed. 1 Bottle Tobin Chad, [place] 01/25/18 1447

## 2018-02-03 ENCOUNTER — Other Ambulatory Visit: Payer: Self-pay | Admitting: Internal Medicine

## 2018-02-03 DIAGNOSIS — Z1231 Encounter for screening mammogram for malignant neoplasm of breast: Secondary | ICD-10-CM

## 2018-02-10 ENCOUNTER — Ambulatory Visit (HOSPITAL_COMMUNITY)
Admission: EM | Admit: 2018-02-10 | Discharge: 2018-02-10 | Disposition: A | Discharge status: Home / Self Care | Payer: BC Managed Care – PPO | Attending: Family Medicine | Admitting: Family Medicine

## 2018-02-10 ENCOUNTER — Other Ambulatory Visit: Payer: Self-pay

## 2018-02-10 ENCOUNTER — Encounter (HOSPITAL_COMMUNITY): Payer: Self-pay

## 2018-02-10 DIAGNOSIS — Z3202 Encounter for pregnancy test, result negative: Secondary | ICD-10-CM | POA: Diagnosis not present

## 2018-02-10 DIAGNOSIS — N912 Amenorrhea, unspecified: Secondary | ICD-10-CM | POA: Diagnosis not present

## 2018-02-10 DIAGNOSIS — M25561 Pain in right knee: Secondary | ICD-10-CM | POA: Diagnosis not present

## 2018-02-10 DIAGNOSIS — G8929 Other chronic pain: Secondary | ICD-10-CM

## 2018-02-10 LAB — POCT URINALYSIS DIP (DEVICE)
BILIRUBIN URINE: NEGATIVE
GLUCOSE, UA: NEGATIVE mg/dL
Hgb urine dipstick: NEGATIVE
KETONES UR: NEGATIVE mg/dL
LEUKOCYTES UA: NEGATIVE
Nitrite: NEGATIVE
PROTEIN: NEGATIVE mg/dL
SPECIFIC GRAVITY, URINE: 1.025 (ref 1.005–1.030)
Urobilinogen, UA: 0.2 mg/dL (ref 0.0–1.0)
pH: 5.5 (ref 5.0–8.0)

## 2018-02-10 LAB — POCT PREGNANCY, URINE: Preg Test, Ur: NEGATIVE

## 2018-02-10 MED ORDER — PREDNISONE 20 MG PO TABS: ORAL_TABLET | ORAL | 0 refills | Status: DC

## 2018-02-10 NOTE — ED Provider Notes (Signed)
[name]    CSN: [id number] Arrival date & time: 02/10/18  1445     History   Chief Complaint Chief Complaint  Patient presents with  . Knee Pain    HPI Melissa Valdez is a 52 y.o. female.   Is a 52 year old woman who comes in complaining about right knee pain.  This is been a chronic problem.  It restarted last Friday.  She had no trauma to the knee initially.  She has been told she has osteoarthritis of the knee.  Patient also complains of amenorrhea for 2 months.  She would like a pregnancy test.   Note from December 02, 2017: Melissa Valdez presents with complaints of right knee pain which started last night. History of bilateral knee pain, states has seen orthopedics and has been told the "cartilage is gone."  No new injury. Has been working cleaning buses prior to school starting, has been kneeling on knees and getting up and down. Took ibuprofen this am which helped temporarily. Pain 8/10. Worse with weight bearing and with flexion of the knee. Hx of neuropathy, no new numbness or tingling. Hx of dm, gerd, htn.      Past Medical History:  Diagnosis Date  . Diabetes mellitus without complication ([place])   . GERD (gastroesophageal reflux disease)   . Hypertension   . Obesity, Class III, BMI 40-49.9 (morbid obesity) ([place])   . Sleep apnea    cpap-     Patient Active Problem List   Diagnosis Date Noted  . Dyspnea 05/09/2017  . Goiter 05/09/2017  . Cellulitis and abscess of hand, except fingers and thumb 10/28/2014  . Hypokalemia 10/28/2014  . Hyperglycemia 10/28/2014  . Diabetes mellitus type II, controlled, with no complications ([place]) 40/01/2724  . Hypertension 10/28/2014  . Morbid obesity with BMI of 40.0-44.9, adult ([place]) 06/28/2014  . Dysmenorrhea 01/20/2014  . Abnormal uterine bleeding (AUB) 01/20/2014  . Fibroids, intramural 01/19/2014    Past Surgical History:  Procedure Laterality Date  . boil on leg removed   1980s  . TUBAL LIGATION  1999     OB History    Gravida  2   Para  2   Term  2   Preterm      AB      Living  2     SAB      TAB      Ectopic      Multiple      Live Births  2            Home Medications    Prior to Admission medications   Medication Sig Start Date End Date Taking? Authorizing Provider  ACCU-CHEK AVIVA PLUS test strip  04/26/14   [provider]  acetaminophen (TYLENOL) 500 MG tablet Take 1,000 mg by mouth as needed for mild pain.    [provider]  amLODipine (NORVASC) 10 MG tablet Take 1 tablet (10 mg total) by mouth daily. 03/06/17   Clent Demark, PA-C  diphenhydrAMINE (BENADRYL) 25 mg capsule Take 50 mg by mouth every 6 (six) hours as needed for sleep.    [provider]  glimepiride (AMARYL) 2 MG tablet TAKE 1 TABLET BY MOUTH DAILY BEFORE BREAKFAST 06/21/17   Clent Demark, PA-C  hydrochlorothiazide (HYDRODIURIL) 25 MG tablet Take 1 tablet (25 mg total) by mouth daily. Take on tablet in the morning. 03/06/17   Clent Demark, PA-C  hydroxypropyl methylcellulose / hypromellose (ISOPTO TEARS / GONIOVISC) 2.5 %  ophthalmic solution Place 2 drops into both eyes daily as needed for dry eyes.    [provider]  ibuprofen (ADVIL,MOTRIN) 200 MG tablet Take 600 mg by mouth every 6 (six) hours as needed for moderate pain.    [provider]  metFORMIN (GLUCOPHAGE) 1000 MG tablet Take 1 tablet (1,000 mg total) by mouth 2 (two) times daily with a meal. 02/06/17   Clent Demark, PA-C  ofloxacin (OCUFLOX) 0.3 % ophthalmic solution 1 drop in left eye every 4 hours for the first 2 days, then 4 times a day for the next 5 days. 01/25/18   Tasia Catchings, Amy V, PA-C  omeprazole (PRILOSEC) 20 MG capsule Take 1 capsule (20 mg total) by mouth daily. 01/16/17   Doristine Devoid, PA-C  Polyethyl Glycol-Propyl Glycol (SYSTANE) 0.4-0.3 % GEL ophthalmic gel Place 1 application into both eyes at bedtime as needed. 01/25/18   Ok Edwards, PA-C   predniSONE (DELTASONE) 20 MG tablet Two daily with food 02/10/18   Robyn Haber, MD  simvastatin (ZOCOR) 10 MG tablet Take 1 tablet (10 mg total) by mouth daily. 01/16/17   Doristine Devoid, PA-C    Family History Family History  Problem Relation Age of Onset  . Hypertension Mother   . Diabetes Father   . Goiter Sister     Social History Social History   Tobacco Use  . Smoking status: Current Every Day Smoker    Packs/day: 0.25    Types: Cigarettes  . Smokeless tobacco: Never Used  Substance Use Topics  . Alcohol use: Yes    Alcohol/week: 3.0 standard drinks    Types: 2 Cans of beer, 1 Shots of liquor per week  . Drug use: No     Allergies   Patient has no known allergies.   Review of Systems Review of Systems  Genitourinary: Positive for menstrual problem.  Musculoskeletal: Positive for gait problem.  All other systems reviewed and are negative.    Physical Exam Triage Vital Signs ED Triage Vitals [02/10/18 1506]  Enc Vitals Group     BP (!) 153/100     Pulse Rate 75     Resp 18     Temp 97.8 F (36.6 C)     Temp src      SpO2 97 %     Weight 220 lb (99.8 kg)     Height      Head Circumference      Peak Flow      Pain Score 8     Pain Loc      Pain Edu?      Excl. in [place]?    No data found.  Updated Vital Signs BP (!) 153/100   Pulse 75   Temp 97.8 F (36.6 C)   Resp 18   Wt 99.8 kg   LMP 12/11/2017   SpO2 97%   BMI 42.97 kg/m    Physical Exam  Constitutional: She appears well-developed and well-nourished.  HENT:  Right Ear: External ear normal.  Left Ear: External ear normal.  Eyes: Conjunctivae are normal.  Neck: Normal range of motion. Neck supple.  Pulmonary/Chest: Effort normal.  Musculoskeletal: Normal range of motion. She exhibits tenderness. She exhibits no edema or deformity.  Mildly tender medial and inferiorly to right patella at the joint line. Minimally antalgic gait.  Skin: Skin is warm and dry.  Psychiatric:  She has a normal mood and affect.  Nursing note and vitals reviewed.  UC Treatments / Results  Labs (all labs ordered are listed, but only abnormal results are displayed) Labs Reviewed  POCT URINALYSIS DIP (DEVICE)    EKG None  Radiology No results found.  Procedures Procedures (including critical care time)  Medications Ordered in UC Medications - No data to display  Initial Impression / Assessment and Plan / UC Course  I have reviewed the triage vital signs and the nursing notes.  Pertinent labs & imaging results that were available during my care of the patient were reviewed by me and considered in my medical decision making (see chart for details).     Final Clinical Impressions(s) / UC Diagnoses   Final diagnoses:  Chronic pain of right knee     Discharge Instructions     Often women start skipping menstrual periods when they reach the age of menopause (around 93).  Follow up with your orthopedist or family doctor if pain persists.    ED Prescriptions    Medication Sig Dispense Auth. Provider   predniSONE (DELTASONE) 20 MG tablet Two daily with food 6 tablet Robyn Haber, MD    Problem list includes redundant entries.   Controlled Substance Prescriptions [place] Controlled Substance Registry consulted? Not Applicable   Robyn Haber, MD 02/10/18 1527

## 2018-02-10 NOTE — ED Triage Notes (Signed)
t c/o right knee pain x 3 days. Pt would like a pregnancy test.

## 2018-02-10 NOTE — Discharge Instructions (Addendum)
Often women start skipping menstrual periods when they reach the age of menopause (around 84).  Follow up with your orthopedist or family doctor if pain persists.

## 2018-02-19 ENCOUNTER — Encounter (INDEPENDENT_AMBULATORY_CARE_PROVIDER_SITE_OTHER): Payer: Self-pay | Admitting: Physician Assistant

## 2018-02-19 ENCOUNTER — Ambulatory Visit (INDEPENDENT_AMBULATORY_CARE_PROVIDER_SITE_OTHER): Payer: BC Managed Care – PPO | Admitting: Physician Assistant

## 2018-02-19 ENCOUNTER — Other Ambulatory Visit: Payer: Self-pay

## 2018-02-19 VITALS — BP 161/93 | HR 76 | Temp 98.0°F | Ht 60.0 in | Wt 220.2 lb

## 2018-02-19 DIAGNOSIS — Z91199 Patient's noncompliance with other medical treatment and regimen due to unspecified reason: Secondary | ICD-10-CM

## 2018-02-19 DIAGNOSIS — Z0000 Encounter for general adult medical examination without abnormal findings: Secondary | ICD-10-CM | POA: Diagnosis not present

## 2018-02-19 DIAGNOSIS — N951 Menopausal and female climacteric states: Secondary | ICD-10-CM | POA: Diagnosis not present

## 2018-02-19 DIAGNOSIS — Z9119 Patient's noncompliance with other medical treatment and regimen: Secondary | ICD-10-CM

## 2018-02-19 DIAGNOSIS — I10 Essential (primary) hypertension: Secondary | ICD-10-CM | POA: Diagnosis not present

## 2018-02-19 DIAGNOSIS — E119 Type 2 diabetes mellitus without complications: Secondary | ICD-10-CM

## 2018-02-19 LAB — POCT GLYCOSYLATED HEMOGLOBIN (HGB A1C): HEMOGLOBIN A1C: 9 % — AB (ref 4.0–5.6)

## 2018-02-19 MED ORDER — ACCU-CHEK AVIVA PLUS VI STRP: ORAL_STRIP | 9 refills | Status: DC

## 2018-02-19 MED ORDER — HYDROCHLOROTHIAZIDE 25 MG PO TABS: 25.0000 mg | ORAL_TABLET | Freq: Every day | ORAL | 1 refills | Status: DC

## 2018-02-19 MED ORDER — GLIMEPIRIDE 2 MG PO TABS: ORAL_TABLET | ORAL | 1 refills | Status: DC

## 2018-02-19 MED ORDER — AMLODIPINE BESYLATE 10 MG PO TABS: 10.0000 mg | ORAL_TABLET | Freq: Every day | ORAL | 1 refills | Status: DC

## 2018-02-19 MED ORDER — METFORMIN HCL 1000 MG PO TABS: 1000.0000 mg | ORAL_TABLET | Freq: Two times a day (BID) | ORAL | 1 refills | Status: DC

## 2018-02-19 MED ORDER — SIMVASTATIN 10 MG PO TABS: 10.0000 mg | ORAL_TABLET | Freq: Every day | ORAL | 1 refills | Status: DC

## 2018-02-19 MED ORDER — PAROXETINE HCL 10 MG PO TABS: 10.0000 mg | ORAL_TABLET | Freq: Every day | ORAL | 5 refills | Status: DC

## 2018-02-19 NOTE — Progress Notes (Signed)
Subjective:  Patient ID: Melissa Valdez, female    DOB: 1966/04/14  Age: 52 y.o. MRN: [id number]  CC: annual exam  HPI Melissa Valdez a 52 y.o.femalewith a medical history of DM2, GERD, HTN, Obesity, and Sleep apneapresents for an annual exam. Has not been taking any of her medications. Does not cite a reason for not taking medications. BP is elevated today and A1c is 9.0% today. Does not endorse any symptoms except for hot flashes and two months of missed menses.      Outpatient Medications Prior to Visit  Medication Sig Dispense Refill  . hydroxypropyl methylcellulose / hypromellose (ISOPTO TEARS / GONIOVISC) 2.5 % ophthalmic solution Place 2 drops into both eyes daily as needed for dry eyes.    Marland Kitchen ofloxacin (OCUFLOX) 0.3 % ophthalmic solution 1 drop in left eye every 4 hours for the first 2 days, then 4 times a day for the next 5 days. 5 mL 0  . Polyethyl Glycol-Propyl Glycol (SYSTANE) 0.4-0.3 % GEL ophthalmic gel Place 1 application into both eyes at bedtime as needed. 1 Bottle 0  . ACCU-CHEK AVIVA PLUS test strip   9  . amLODipine (NORVASC) 10 MG tablet Take 1 tablet (10 mg total) by mouth daily. (Patient not taking: Reported on 02/19/2018) 90 tablet 1  . glimepiride (AMARYL) 2 MG tablet TAKE 1 TABLET BY MOUTH DAILY BEFORE BREAKFAST (Patient not taking: Reported on 02/19/2018) 30 tablet 0  . hydrochlorothiazide (HYDRODIURIL) 25 MG tablet Take 1 tablet (25 mg total) by mouth daily. Take on tablet in the morning. (Patient not taking: Reported on 02/19/2018) 90 tablet 1  . ibuprofen (ADVIL,MOTRIN) 200 MG tablet Take 600 mg by mouth every 6 (six) hours as needed for moderate pain.    . metFORMIN (GLUCOPHAGE) 1000 MG tablet Take 1 tablet (1,000 mg total) by mouth 2 (two) times daily with a meal. (Patient not taking: Reported on 02/19/2018) 180 tablet 3  . simvastatin (ZOCOR) 10 MG tablet Take 1 tablet (10 mg total) by mouth daily. (Patient not taking: Reported on 02/19/2018) 30 tablet 0  .  acetaminophen (TYLENOL) 500 MG tablet Take 1,000 mg by mouth as needed for mild pain.    . diphenhydrAMINE (BENADRYL) 25 mg capsule Take 50 mg by mouth every 6 (six) hours as needed for sleep.    Marland Kitchen omeprazole (PRILOSEC) 20 MG capsule Take 1 capsule (20 mg total) by mouth daily. 30 capsule 0  . predniSONE (DELTASONE) 20 MG tablet Two daily with food 6 tablet 0   No facility-administered medications prior to visit.      ROS Review of Systems  Constitutional: Negative for chills, fever and malaise/fatigue.  Eyes: Negative for blurred vision.  Respiratory: Negative for shortness of breath.   Cardiovascular: Negative for chest pain and palpitations.  Gastrointestinal: Negative for abdominal pain and nausea.  Genitourinary: Negative for dysuria and hematuria.  Musculoskeletal: Negative for joint pain and myalgias.  Skin: Negative for rash.  Neurological: Negative for tingling and headaches.  Endo/Heme/Allergies:       Hotflashes  Psychiatric/Behavioral: Negative for depression. The patient is not nervous/anxious.     Objective:  Ht 5' (1.524 m)   Wt 220 lb 3.2 oz (99.9 kg)   LMP 02/17/2018   BMI 43.00 kg/m    Vitals:   02/19/18 1447  BP: (!) 161/93  Pulse: 76  Temp: 98 F (36.7 C)  TempSrc: Oral  SpO2: 94%  Weight: 220 lb 3.2 oz (99.9 kg)  Height: 5' (1.524 m)  Physical Exam  Constitutional: She is oriented to person, place, and time.  Well developed, obese, NAD, polite  HENT:  Head: Normocephalic and atraumatic.  Mouth/Throat: No oropharyngeal exudate.  Eyes: Pupils are equal, round, and reactive to light. EOM are normal. No scleral icterus.  Neck: Normal range of motion. Neck supple. No thyromegaly present.  Cardiovascular: Normal rate, regular rhythm and normal heart sounds.  No LE edema bilaterally  Pulmonary/Chest: Effort normal and breath sounds normal.  Abdominal: Soft. Bowel sounds are normal. There is tenderness (mild RUQ TTP).  Musculoskeletal:  Normal range of motion. She exhibits no edema.  Lymphadenopathy:    She has no cervical adenopathy.  Neurological: She is alert and oriented to person, place, and time.  Strength 5/5 throughout  Skin: Skin is warm and dry. No rash noted. No erythema. No pallor.  Psychiatric: She has a normal mood and affect. Her behavior is normal. Thought content normal.  Vitals reviewed.    Assessment & Plan:    1. Annual physical exam - Comprehensive metabolic panel; Future - CBC with Differential; Future - TSH; Future - Lipid panel; Future  2. Type 2 diabetes mellitus without complication, without long-term current use of insulin (HCC) - HgB A1c 9.0 % today. - Refill metFORMIN (GLUCOPHAGE) 1000 MG tablet; Take 1 tablet (1,000 mg total) by mouth 2 (two) times daily with a meal.  Dispense: 180 tablet; Refill: 1 - Refill glimepiride (AMARYL) 2 MG tablet; TAKE 1 TABLET BY MOUTH DAILY BEFORE BREAKFAST  Dispense: 90 tablet; Refill: 1 - Refill ACCU-CHEK AVIVA PLUS test strip; Use three times per day  Dispense: 100 each; Refill: 9  3. Menopausal syndrome (hot flashes) - Begin PARoxetine (PAXIL) 10 MG tablet; Take 1 tablet (10 mg total) by mouth daily.  Dispense: 30 tablet; Refill: 5  4. Hypertension, unspecified type - Refill simvastatin (ZOCOR) 10 MG tablet; Take 1 tablet (10 mg total) by mouth daily.  Dispense: 90 tablet; Refill: 1 - Refill hydrochlorothiazide (HYDRODIURIL) 25 MG tablet; Take 1 tablet (25 mg total) by mouth daily. Take on tablet in the morning.  Dispense: 90 tablet; Refill: 1 - Refill amLODipine (NORVASC) 10 MG tablet; Take 1 tablet (10 mg total) by mouth daily.  Dispense: 90 tablet; Refill: 1  5. Medically noncompliant - I have explained that I will dismiss her from practice should she continue to be noncompliant. Pt understood and stated she will be taking medications as directed.    Meds ordered this encounter  Medications  . PARoxetine (PAXIL) 10 MG tablet    Sig: Take 1  tablet (10 mg total) by mouth daily.    Dispense:  30 tablet    Refill:  5    Order Specific Question:   Supervising Provider    Answer:   Charlott Rakes [[id number]]  . simvastatin (ZOCOR) 10 MG tablet    Sig: Take 1 tablet (10 mg total) by mouth daily.    Dispense:  90 tablet    Refill:  1    Order Specific Question:   Supervising Provider    Answer:   Charlott Rakes [[id number]]  . metFORMIN (GLUCOPHAGE) 1000 MG tablet    Sig: Take 1 tablet (1,000 mg total) by mouth 2 (two) times daily with a meal.    Dispense:  180 tablet    Refill:  1    Order Specific Question:   Supervising Provider    Answer:   Charlott Rakes [[id number]]  . hydrochlorothiazide (HYDRODIURIL) 25 MG tablet  Sig: Take 1 tablet (25 mg total) by mouth daily. Take on tablet in the morning.    Dispense:  90 tablet    Refill:  1    Order Specific Question:   Supervising Provider    Answer:   Charlott Rakes [[id number]]  . glimepiride (AMARYL) 2 MG tablet    Sig: TAKE 1 TABLET BY MOUTH DAILY BEFORE BREAKFAST    Dispense:  90 tablet    Refill:  1    Order Specific Question:   Supervising Provider    Answer:   Charlott Rakes [[id number]]  . amLODipine (NORVASC) 10 MG tablet    Sig: Take 1 tablet (10 mg total) by mouth daily.    Dispense:  90 tablet    Refill:  1    Order Specific Question:   Supervising Provider    Answer:   Charlott Rakes [[id number]]  . ACCU-CHEK AVIVA PLUS test strip    Sig: Use three times per day    Dispense:  100 each    Refill:  9    ICD 10 E11.9    Order Specific Question:   Supervising Provider    Answer:   Charlott Rakes [[id number]]    Follow-up: Return in about 3 months (around 05/22/2018) for HTN, DM.   Clent Demark PA

## 2018-02-19 NOTE — Patient Instructions (Signed)
Diabetes Mellitus and Nutrition When you have diabetes (diabetes mellitus), it is very important to have healthy eating habits because your blood sugar (glucose) levels are greatly affected by what you eat and drink. Eating healthy foods in the appropriate amounts, at about the same times every day, can help you:  Control your blood glucose.  Lower your risk of heart disease.  Improve your blood pressure.  Reach or maintain a healthy weight.  Every person with diabetes is different, and each person has different needs for a meal plan. Your health care provider may recommend that you work with a diet and nutrition specialist (dietitian) to make a meal plan that is best for you. Your meal plan may vary depending on factors such as:  The calories you need.  The medicines you take.  Your weight.  Your blood glucose, blood pressure, and cholesterol levels.  Your activity level.  Other health conditions you have, such as heart or kidney disease.  How do carbohydrates affect me? Carbohydrates affect your blood glucose level more than any other type of food. Eating carbohydrates naturally increases the amount of glucose in your blood. Carbohydrate counting is a method for keeping track of how many carbohydrates you eat. Counting carbohydrates is important to keep your blood glucose at a healthy level, especially if you use insulin or take certain oral diabetes medicines. It is important to know how many carbohydrates you can safely have in each meal. This is different for every person. Your dietitian can help you calculate how many carbohydrates you should have at each meal and for snack. Foods that contain carbohydrates include:  Bread, cereal, rice, pasta, and crackers.  Potatoes and corn.  Peas, beans, and lentils.  Milk and yogurt.  Fruit and juice.  Desserts, such as cakes, cookies, ice cream, and candy.  How does alcohol affect me? Alcohol can cause a sudden decrease in blood  glucose (hypoglycemia), especially if you use insulin or take certain oral diabetes medicines. Hypoglycemia can be a life-threatening condition. Symptoms of hypoglycemia (sleepiness, dizziness, and confusion) are similar to symptoms of having too much alcohol. If your health care provider says that alcohol is safe for you, follow these guidelines:  Limit alcohol intake to no more than 1 drink per day for nonpregnant women and 2 drinks per day for men. One drink equals 12 oz of beer, 5 oz of wine, or 1 oz of hard liquor.  Do not drink on an empty stomach.  Keep yourself hydrated with water, diet soda, or unsweetened iced tea.  Keep in mind that regular soda, juice, and other mixers may contain a lot of sugar and must be counted as carbohydrates.  What are tips for following this plan? Reading food labels  Start by checking the serving size on the label. The amount of calories, carbohydrates, fats, and other nutrients listed on the label are based on one serving of the food. Many foods contain more than one serving per package.  Check the total grams (g) of carbohydrates in one serving. You can calculate the number of servings of carbohydrates in one serving by dividing the total carbohydrates by 15. For example, if a food has 30 g of total carbohydrates, it would be equal to 2 servings of carbohydrates.  Check the number of grams (g) of saturated and trans fats in one serving. Choose foods that have low or no amount of these fats.  Check the number of milligrams (mg) of sodium in one serving. Most people   should limit total sodium intake to less than 2,300 mg per day.  Always check the nutrition information of foods labeled as "low-fat" or "nonfat". These foods may be higher in added sugar or refined carbohydrates and should be avoided.  Talk to your dietitian to identify your daily goals for nutrients listed on the label. Shopping  Avoid buying canned, premade, or processed foods. These  foods tend to be high in fat, sodium, and added sugar.  Shop around the outside edge of the grocery store. This includes fresh fruits and vegetables, bulk grains, fresh meats, and fresh dairy. Cooking  Use low-heat cooking methods, such as baking, instead of high-heat cooking methods like deep frying.  Cook using healthy oils, such as olive, canola, or sunflower oil.  Avoid cooking with butter, cream, or high-fat meats. Meal planning  Eat meals and snacks regularly, preferably at the same times every day. Avoid going long periods of time without eating.  Eat foods high in fiber, such as fresh fruits, vegetables, beans, and whole grains. Talk to your dietitian about how many servings of carbohydrates you can eat at each meal.  Eat 4-6 ounces of lean protein each day, such as lean meat, chicken, fish, eggs, or tofu. 1 ounce is equal to 1 ounce of meat, chicken, or fish, 1 egg, or 1/4 cup of tofu.  Eat some foods each day that contain healthy fats, such as avocado, nuts, seeds, and fish. Lifestyle   Check your blood glucose regularly.  Exercise at least 30 minutes 5 or more days each week, or as told by your health care provider.  Take medicines as told by your health care provider.  Do not use any products that contain nicotine or tobacco, such as cigarettes and e-cigarettes. If you need help quitting, ask your health care provider.  Work with a counselor or diabetes educator to identify strategies to manage stress and any emotional and social challenges. What are some questions to ask my health care provider?  Do I need to meet with a diabetes educator?  Do I need to meet with a dietitian?  What number can I call if I have questions?  When are the best times to check my blood glucose? Where to find more information:  American Diabetes Association: diabetes.org/food-and-fitness/food  Academy of Nutrition and Dietetics:  www.eatright.org/resources/health/diseases-and-conditions/diabetes  National Institute of Diabetes and Digestive and Kidney Diseases (NIH): www.niddk.nih.gov/health-information/diabetes/overview/diet-eating-physical-activity Summary  A healthy meal plan will help you control your blood glucose and maintain a healthy lifestyle.  Working with a diet and nutrition specialist (dietitian) can help you make a meal plan that is best for you.  Keep in mind that carbohydrates and alcohol have immediate effects on your blood glucose levels. It is important to count carbohydrates and to use alcohol carefully. This information is not intended to replace advice given to you by your health care provider. Make sure you discuss any questions you have with your health care provider. Document Released: 12/28/2004 Document Revised: 05/07/2016 Document Reviewed: 05/07/2016 Elsevier Interactive Patient Education  2018 Elsevier Inc.  

## 2018-02-21 ENCOUNTER — Other Ambulatory Visit (INDEPENDENT_AMBULATORY_CARE_PROVIDER_SITE_OTHER): Payer: Self-pay | Admitting: Physician Assistant

## 2018-02-21 DIAGNOSIS — I10 Essential (primary) hypertension: Secondary | ICD-10-CM

## 2018-02-21 NOTE — Telephone Encounter (Signed)
FWD to PCP. Nasim Garofano S Kailee Essman, CMA  

## 2018-03-03 ENCOUNTER — Other Ambulatory Visit (INDEPENDENT_AMBULATORY_CARE_PROVIDER_SITE_OTHER): Payer: BC Managed Care – PPO

## 2018-03-03 DIAGNOSIS — Z0000 Encounter for general adult medical examination without abnormal findings: Secondary | ICD-10-CM

## 2018-03-03 NOTE — Progress Notes (Signed)
Lab visit

## 2018-03-04 ENCOUNTER — Encounter (HOSPITAL_COMMUNITY): Payer: Self-pay | Admitting: Emergency Medicine

## 2018-03-04 ENCOUNTER — Ambulatory Visit (HOSPITAL_COMMUNITY)
Admission: EM | Admit: 2018-03-04 | Discharge: 2018-03-04 | Disposition: A | Discharge status: Home / Self Care | Payer: BC Managed Care – PPO | Attending: Family Medicine | Admitting: Family Medicine

## 2018-03-04 DIAGNOSIS — H5789 Other specified disorders of eye and adnexa: Secondary | ICD-10-CM | POA: Diagnosis not present

## 2018-03-04 LAB — CBC WITH DIFFERENTIAL/PLATELET
BASOS: 1 %
Basophils Absolute: 0.1 10*3/uL (ref 0.0–0.2)
EOS (ABSOLUTE): 0.1 10*3/uL (ref 0.0–0.4)
EOS: 1 %
HEMATOCRIT: 42.5 % (ref 34.0–46.6)
Hemoglobin: 13.6 g/dL (ref 11.1–15.9)
Immature Grans (Abs): 0 10*3/uL (ref 0.0–0.1)
Immature Granulocytes: 0 %
LYMPHS ABS: 2.8 10*3/uL (ref 0.7–3.1)
Lymphs: 27 %
MCH: 27.1 pg (ref 26.6–33.0)
MCHC: 32 g/dL (ref 31.5–35.7)
MCV: 85 fL (ref 79–97)
MONOS ABS: 0.6 10*3/uL (ref 0.1–0.9)
Monocytes: 6 %
NEUTROS ABS: 6.7 10*3/uL (ref 1.4–7.0)
Neutrophils: 65 %
Platelets: 393 10*3/uL (ref 150–450)
RBC: 5.02 x10E6/uL (ref 3.77–5.28)
RDW: 13.7 % (ref 12.3–15.4)
WBC: 10.3 10*3/uL (ref 3.4–10.8)

## 2018-03-04 LAB — COMPREHENSIVE METABOLIC PANEL
A/G RATIO: 1.7 (ref 1.2–2.2)
ALBUMIN: 4.4 g/dL (ref 3.5–5.5)
ALK PHOS: 87 IU/L (ref 39–117)
ALT: 25 IU/L (ref 0–32)
AST: 21 IU/L (ref 0–40)
BUN / CREAT RATIO: 25 — AB (ref 9–23)
BUN: 13 mg/dL (ref 6–24)
Bilirubin Total: 0.2 mg/dL (ref 0.0–1.2)
CO2: 26 mmol/L (ref 20–29)
Calcium: 10.6 mg/dL — ABNORMAL HIGH (ref 8.7–10.2)
Chloride: 94 mmol/L — ABNORMAL LOW (ref 96–106)
Creatinine, Ser: 0.51 mg/dL — ABNORMAL LOW (ref 0.57–1.00)
GFR calc Af Amer: 128 mL/min/{1.73_m2} (ref >59)
GFR calc non Af Amer: 111 mL/min/{1.73_m2} (ref >59)
GLOBULIN, TOTAL: 2.6 g/dL (ref 1.5–4.5)
Glucose: 118 mg/dL — ABNORMAL HIGH (ref 65–99)
POTASSIUM: 3.1 mmol/L — AB (ref 3.5–5.2)
SODIUM: 141 mmol/L (ref 134–144)
Total Protein: 7 g/dL (ref 6.0–8.5)

## 2018-03-04 LAB — TSH: TSH: 0.772 u[IU]/mL (ref 0.450–4.500)

## 2018-03-04 LAB — LIPID PANEL
Chol/HDL Ratio: 3.5 ratio (ref 0.0–4.4)
Cholesterol, Total: 137 mg/dL (ref 100–199)
HDL: 39 mg/dL — AB (ref >39)
LDL CALC: 76 mg/dL (ref 0–99)
TRIGLYCERIDES: 110 mg/dL (ref 0–149)
VLDL CHOLESTEROL CAL: 22 mg/dL (ref 5–40)

## 2018-03-04 MED ORDER — POLYETHYL GLYCOL-PROPYL GLYCOL 0.4-0.3 % OP GEL: 1.0000 "application " | Freq: Every evening | OPHTHALMIC | 0 refills | Status: DC | PRN

## 2018-03-04 MED ORDER — POLYETHYL GLYCOL-PROPYL GLYCOL 0.4-0.3 % OP SOLN: 1.0000 [drp] | Freq: Four times a day (QID) | OPHTHALMIC | 0 refills | Status: DC | PRN

## 2018-03-04 NOTE — ED Provider Notes (Signed)
[place]    CSN: [id number] Arrival date & time: 03/04/18  1001     History   Chief Complaint Chief Complaint  Patient presents with  . Eye Pain    HPI Melissa Valdez is a 52 y.o. female.   52 year old female comes in for 1 day history of left eye irritation. States eye was slightly red when she when she woke up this morning with irritation. Denies eye drainage/discharge, vision changes. Mild photophobia. Denies contact lens use, does wear glasses. States when she had similar symptoms in the past, it was found she had a corneal abrasion. She denies any injury/trauma. Denies URI symptoms such as cough, congestion, sore throat. Denies fever, chills, night sweats.      Past Medical History:  Diagnosis Date  . Diabetes mellitus without complication ([place])   . GERD (gastroesophageal reflux disease)   . Hypertension   . Obesity, Class III, BMI 40-49.9 (morbid obesity) ([place])   . Sleep apnea    cpap-     Patient Active Problem List   Diagnosis Date Noted  . Dyspnea 05/09/2017  . Goiter 05/09/2017  . Cellulitis and abscess of hand, except fingers and thumb 10/28/2014  . Hypokalemia 10/28/2014  . Hyperglycemia 10/28/2014  . Diabetes mellitus type II, controlled, with no complications ([place]) 46/27/0350  . Hypertension 10/28/2014  . Morbid obesity with BMI of 40.0-44.9, adult ([place]) 06/28/2014  . Dysmenorrhea 01/20/2014  . Abnormal uterine bleeding (AUB) 01/20/2014  . Fibroids, intramural 01/19/2014    Past Surgical History:  Procedure Laterality Date  . boil on leg removed   1980s  . TUBAL LIGATION  1999    OB History    Gravida  2   Para  2   Term  2   Preterm      AB      Living  2     SAB      TAB      Ectopic      Multiple      Live Births  2            Home Medications    Prior to Admission medications   Medication Sig Start Date End Date Taking? Authorizing Provider  ACCU-CHEK AVIVA PLUS test strip Use three times per day  02/19/18   Clent Demark, PA-C  amLODipine (NORVASC) 10 MG tablet Take 1 tablet (10 mg total) by mouth daily. 02/19/18   Clent Demark, PA-C  glimepiride (AMARYL) 2 MG tablet TAKE 1 TABLET BY MOUTH DAILY BEFORE BREAKFAST 02/19/18   Clent Demark, PA-C  hydrochlorothiazide (HYDRODIURIL) 25 MG tablet Take 1 tablet (25 mg total) by mouth daily. Take on tablet in the morning. 02/19/18   Clent Demark, PA-C  hydroxypropyl methylcellulose / hypromellose (ISOPTO TEARS / GONIOVISC) 2.5 % ophthalmic solution Place 2 drops into both eyes daily as needed for dry eyes.    [provider]  metFORMIN (GLUCOPHAGE) 1000 MG tablet Take 1 tablet (1,000 mg total) by mouth 2 (two) times daily with a meal. 02/19/18   Clent Demark, PA-C  PARoxetine (PAXIL) 10 MG tablet Take 1 tablet (10 mg total) by mouth daily. 02/19/18   Clent Demark, PA-C  Polyethyl Glycol-Propyl Glycol (SYSTANE) 0.4-0.3 % GEL ophthalmic gel Place 1 application into both eyes at bedtime as needed. 03/04/18   Tasia Catchings, Amy V, PA-C  Polyethyl Glycol-Propyl Glycol (SYSTANE) 0.4-0.3 % SOLN Apply 1-2 drops to eye 4 (four) times daily as  needed. 03/04/18   Tasia Catchings, Amy V, PA-C  simvastatin (ZOCOR) 10 MG tablet Take 1 tablet (10 mg total) by mouth daily. 02/19/18   Clent Demark, PA-C    Family History Family History  Problem Relation Age of Onset  . Hypertension Mother   . Diabetes Father   . Goiter Sister     Social History Social History   Tobacco Use  . Smoking status: Current Every Day Smoker    Packs/day: 0.25    Types: Cigarettes  . Smokeless tobacco: Never Used  Substance Use Topics  . Alcohol use: Yes    Alcohol/week: 3.0 standard drinks    Types: 2 Cans of beer, 1 Shots of liquor per week  . Drug use: No     Allergies   Patient has no known allergies.   Review of Systems Review of Systems  Reason unable to perform ROS: See HPI as above.     Physical Exam Triage Vital Signs ED Triage Vitals  [03/04/18 1131]  Enc Vitals Group     BP 128/78     Pulse Rate 65     Resp 18     Temp 97.8 F (36.6 C)     Temp Source Oral     SpO2 97 %     Weight      Height      Head Circumference      Peak Flow      Pain Score 7     Pain Loc      Pain Edu?      Excl. in [place]?    No data found.  Updated Vital Signs BP 128/78 (BP Location: Left Arm)   Pulse 65   Temp 97.8 F (36.6 C) (Oral)   Resp 18   LMP 02/17/2018   SpO2 97%   Visual Acuity Right Eye Distance: 20/70 Left Eye Distance: 20/70 Bilateral Distance: 20/50(with glasses for complete exam)  Right Eye Near:   Left Eye Near:    Bilateral Near:     Physical Exam  Constitutional: She is oriented to person, place, and time. She appears well-developed and well-nourished. No distress.  HENT:  Head: Normocephalic and atraumatic.  Eyes: Pupils are equal, round, and reactive to light. EOM and lids are normal. Lids are everted and swept, no foreign bodies found.  Mild left conjunctival injection.  Fluorescein stain without uptake.  Neurological: She is alert and oriented to person, place, and time.     UC Treatments / Results  Labs (all labs ordered are listed, but only abnormal results are displayed) Labs Reviewed - No data to display  EKG None  Radiology No results found.  Procedures Procedures (including critical care time)  Medications Ordered in UC Medications - No data to display  Initial Impression / Assessment and Plan / UC Course  I have reviewed the triage vital signs and the nursing notes.  Pertinent labs & imaging results that were available during my care of the patient were reviewed by me and considered in my medical decision making (see chart for details).    Exam without corneal abrasion. Discussed possible dry eyes causing symptoms. Artificial tears, lids scrub, warm compress. Return precautions given.  Final Clinical Impressions(s) / UC Diagnoses   Final diagnoses:  Eye irritation     ED Prescriptions    Medication Sig Dispense Auth. Provider   Polyethyl Glycol-Propyl Glycol (SYSTANE) 0.4-0.3 % GEL ophthalmic gel Place 1 application into both eyes at bedtime as needed. 1  Bottle Yu, Amy V, PA-C   Polyethyl Glycol-Propyl Glycol (SYSTANE) 0.4-0.3 % SOLN Apply 1-2 drops to eye 4 (four) times daily as needed. 1 Bottle Tobin Chad, [place] 03/04/18 1346

## 2018-03-04 NOTE — Discharge Instructions (Signed)
No corneal abrasion/scratch on eye on exam today. Artificial tear drops and gel as needed. You can put them in the fridge to sooth the eye. Lid scrubs and warm compresses as directed. Monitor for any worsening of symptoms, changes in vision, sensitivity to light, eye swelling, painful eye movement, follow up with ophthalmology for further evaluation.

## 2018-03-04 NOTE — ED Triage Notes (Signed)
Pt sts left eye pain x 2 days; pt sts hx of similar when had abrasion

## 2018-03-05 ENCOUNTER — Other Ambulatory Visit (INDEPENDENT_AMBULATORY_CARE_PROVIDER_SITE_OTHER): Payer: Self-pay | Admitting: Physician Assistant

## 2018-03-05 DIAGNOSIS — E876 Hypokalemia: Secondary | ICD-10-CM

## 2018-03-05 MED ORDER — POTASSIUM CHLORIDE CRYS ER 20 MEQ PO TBCR: 20.0000 meq | EXTENDED_RELEASE_TABLET | Freq: Every day | ORAL | 1 refills | Status: DC

## 2018-03-06 ENCOUNTER — Telehealth (INDEPENDENT_AMBULATORY_CARE_PROVIDER_SITE_OTHER): Payer: Self-pay

## 2018-03-06 NOTE — Telephone Encounter (Signed)
Left message notifying patient that her cholesterol is within normal limits but she should exercise more to increase "good" cholesterol. Thyroid and CBC normal. Potassium low, potassium pills sent to [facility] on [address]. Calcium is elevated, recommended to stop taking any calcium or vitamin D supplements and have parathyroid hormone levels checked at next OV. Return call to [facility] with any questions or concerns. [name], CMA

## 2018-03-06 NOTE — Telephone Encounter (Signed)
-----   Message from Clent Demark, PA-C sent at 03/05/2018  2:10 PM EST ----- Cholesterol within normal limits but should exercise more to increase "good" cholesterol.  Thyroid normal. CBC normal. Potassium low. I have sent potassium pills to [facility] on [address]. Calcium is elevated. I suggest she stop taking any calcium or vit D supplements and have parathyroid hormone levels checked at her next visit.

## 2018-03-12 ENCOUNTER — Ambulatory Visit
Admission: RE | Admit: 2018-03-12 | Discharge: 2018-03-12 | Disposition: A | Discharge status: Home / Self Care | Payer: BC Managed Care – PPO | Source: Ambulatory Visit | Attending: Internal Medicine | Admitting: Internal Medicine

## 2018-03-12 DIAGNOSIS — Z1231 Encounter for screening mammogram for malignant neoplasm of breast: Secondary | ICD-10-CM

## 2018-05-09 ENCOUNTER — Ambulatory Visit (HOSPITAL_COMMUNITY)
Admission: EM | Admit: 2018-05-09 | Discharge: 2018-05-09 | Disposition: A | Discharge status: Home / Self Care | Payer: BC Managed Care – PPO | Attending: Internal Medicine | Admitting: Internal Medicine

## 2018-05-09 ENCOUNTER — Encounter (HOSPITAL_COMMUNITY): Payer: Self-pay | Admitting: Emergency Medicine

## 2018-05-09 DIAGNOSIS — H1045 Other chronic allergic conjunctivitis: Secondary | ICD-10-CM | POA: Diagnosis not present

## 2018-05-09 DIAGNOSIS — E119 Type 2 diabetes mellitus without complications: Secondary | ICD-10-CM

## 2018-05-09 DIAGNOSIS — I10 Essential (primary) hypertension: Secondary | ICD-10-CM | POA: Diagnosis not present

## 2018-05-09 MED ORDER — OLOPATADINE HCL 0.1 % OP SOLN: 1.0000 [drp] | Freq: Two times a day (BID) | OPHTHALMIC | 12 refills | Status: DC

## 2018-05-09 NOTE — ED Triage Notes (Signed)
Pt c/o "L eye soreness" since Wednesday, pt states she has dry eyes. Pt states she has some eye drops that shes using, scrip given to her in oct of last year.

## 2018-05-09 NOTE — ED Provider Notes (Addendum)
[place]    CSN: [id number] Arrival date & time: 05/09/18  2694     History   Chief Complaint Chief Complaint  Patient presents with  . Eye Problem    HPI Melissa Valdez is a 53 y.o. female history of diabetes mellitus type 2-trolled on oral hypoglycemic agents, hypertension-controlled on oral agents comes to the urgent care on account of left eye burning with redness of 1 day duration.  Patient says that symptoms started 1 day ago and is gotten worse today.  She admits to having a sensation of foreign body under her eye.  No trauma to the eye.  No itching.  She has increased sensation to light.  She has not tried any treatments.  No relieving factors.  No association with runny nose, sore throat or postnasal drip.  HPI  Past Medical History:  Diagnosis Date  . Diabetes mellitus without complication ([place])   . GERD (gastroesophageal reflux disease)   . Hypertension   . Obesity, Class III, BMI 40-49.9 (morbid obesity) ([place])   . Sleep apnea    cpap-     Patient Active Problem List   Diagnosis Date Noted  . Dyspnea 05/09/2017  . Goiter 05/09/2017  . Cellulitis and abscess of hand, except fingers and thumb 10/28/2014  . Hypokalemia 10/28/2014  . Hyperglycemia 10/28/2014  . Diabetes mellitus type II, controlled, with no complications ([place]) 85/46/2703  . Hypertension 10/28/2014  . Morbid obesity with BMI of 40.0-44.9, adult ([place]) 06/28/2014  . Dysmenorrhea 01/20/2014  . Abnormal uterine bleeding (AUB) 01/20/2014  . Fibroids, intramural 01/19/2014    Past Surgical History:  Procedure Laterality Date  . boil on leg removed   1980s  . TUBAL LIGATION  1999    OB History    Gravida  2   Para  2   Term  2   Preterm      AB      Living  2     SAB      TAB      Ectopic      Multiple      Live Births  2            Home Medications    Prior to Admission medications   Medication Sig Start Date End Date Taking? Authorizing Provider    ACCU-CHEK AVIVA PLUS test strip Use three times per day 02/19/18   Clent Demark, PA-C  amLODipine (NORVASC) 10 MG tablet Take 1 tablet (10 mg total) by mouth daily. 02/19/18   Clent Demark, PA-C  glimepiride (AMARYL) 2 MG tablet TAKE 1 TABLET BY MOUTH DAILY BEFORE BREAKFAST 02/19/18   Clent Demark, PA-C  hydrochlorothiazide (HYDRODIURIL) 25 MG tablet Take 1 tablet (25 mg total) by mouth daily. Take on tablet in the morning. 02/19/18   Clent Demark, PA-C  hydroxypropyl methylcellulose / hypromellose (ISOPTO TEARS / GONIOVISC) 2.5 % ophthalmic solution Place 2 drops into both eyes daily as needed for dry eyes.    [provider]  metFORMIN (GLUCOPHAGE) 1000 MG tablet Take 1 tablet (1,000 mg total) by mouth 2 (two) times daily with a meal. 02/19/18   Clent Demark, PA-C  PARoxetine (PAXIL) 10 MG tablet Take 1 tablet (10 mg total) by mouth daily. 02/19/18   Clent Demark, PA-C  Polyethyl Glycol-Propyl Glycol (SYSTANE) 0.4-0.3 % GEL ophthalmic gel Place 1 application into both eyes at bedtime as needed. 03/04/18   Ok Edwards, PA-C  Polyethyl Glycol-Propyl  Glycol (SYSTANE) 0.4-0.3 % SOLN Apply 1-2 drops to eye 4 (four) times daily as needed. 03/04/18   Tasia Catchings, Amy V, PA-C  potassium chloride SA (K-DUR,KLOR-CON) 20 MEQ tablet Take 1 tablet (20 mEq total) by mouth daily. 03/05/18   Clent Demark, PA-C  simvastatin (ZOCOR) 10 MG tablet Take 1 tablet (10 mg total) by mouth daily. 02/19/18   Clent Demark, PA-C    Family History Family History  Problem Relation Age of Onset  . Hypertension Mother   . Diabetes Father   . Goiter Sister     Social History Social History   Tobacco Use  . Smoking status: Current Every Day Smoker    Packs/day: 0.25    Types: Cigarettes  . Smokeless tobacco: Never Used  Substance Use Topics  . Alcohol use: Yes    Alcohol/week: 3.0 standard drinks    Types: 2 Cans of beer, 1 Shots of liquor per week  . Drug use: No      Allergies   Patient has no known allergies.   Review of Systems Review of Systems  Constitutional: Negative for activity change and appetite change.  HENT: Negative for ear discharge, facial swelling, postnasal drip, rhinorrhea, sinus pressure, sneezing and sore throat.   Eyes: Positive for photophobia, pain and discharge. Negative for itching and visual disturbance.  Respiratory: Negative for shortness of breath and wheezing.   Cardiovascular: Negative for chest pain and palpitations.  Allergic/Immunologic: Negative for environmental allergies and food allergies.  Neurological: Negative for tremors, syncope, light-headedness and numbness.     Physical Exam Triage Vital Signs ED Triage Vitals  Enc Vitals Group     BP 05/09/18 1028 134/84     Pulse Rate 05/09/18 1028 87     Resp 05/09/18 1028 16     Temp 05/09/18 1028 97.8 F (36.6 C)     Temp src --      SpO2 05/09/18 1028 96 %     Weight --      Height --      Head Circumference --      Peak Flow --      Pain Score 05/09/18 1030 0     Pain Loc --      Pain Edu? --      Excl. in [place]? --    No data found.  Updated Vital Signs BP 134/84   Pulse 87   Temp 97.8 F (36.6 C)   Resp 16   LMP 03/20/2018   SpO2 96%   Visual Acuity Right Eye Distance:   Left Eye Distance:   Bilateral Distance:    Right Eye Near:   Left Eye Near:    Bilateral Near:     Physical Exam Constitutional:      Appearance: Normal appearance.  HENT:     Right Ear: Tympanic membrane normal.     Left Ear: Tympanic membrane normal.     Nose: No congestion or rhinorrhea.     Mouth/Throat:     Pharynx: No posterior oropharyngeal erythema.  Eyes:     General:        Left eye: No discharge.     Pupils: Pupils are equal, round, and reactive to light.   Neck:     Musculoskeletal: Normal range of motion. No muscular tenderness.  Cardiovascular:     Rate and Rhythm: Normal rate and regular rhythm.     Heart sounds: No murmur. No  gallop.   Lymphadenopathy:  Cervical: No cervical adenopathy.  Neurological:     Mental Status: She is alert.      UC Treatments / Results  Labs (all labs ordered are listed, but only abnormal results are displayed) Labs Reviewed - No data to display  EKG None  Radiology No results found.  Procedures Procedures (including critical care time)  Medications Ordered in UC Medications - No data to display  Initial Impression / Assessment and Plan / UC Course  I have reviewed the triage vital signs and the nursing notes.  Pertinent labs & imaging results that were available during my care of the patient were reviewed by me and considered in my medical decision making (see chart for details).     1.  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  Hypertension, controlled Continue current antihypertensive medication regimen  3. Diabetes mellitus type 2, Continue oral hypoglycemic agents. Final Clinical Impressions(s) / UC Diagnoses   Final diagnoses:  None   Discharge Instructions   None    ED Prescriptions    None     Controlled Substance Prescriptions [place] Controlled Substance Registry consulted? No   Chase Picket, MD 05/09/18 1121    Chase Picket, MD 05/09/18 [phone]

## 2018-05-12 ENCOUNTER — Other Ambulatory Visit: Payer: Self-pay | Admitting: Internal Medicine

## 2018-05-12 MED ORDER — POTASSIUM CHLORIDE CRYS ER 20 MEQ PO TBCR: 20.0000 meq | EXTENDED_RELEASE_TABLET | Freq: Every day | ORAL | 3 refills | Status: DC

## 2018-05-15 ENCOUNTER — Ambulatory Visit: Payer: BC Managed Care – PPO | Admitting: Endocrinology

## 2018-05-16 ENCOUNTER — Ambulatory Visit: Payer: BC Managed Care – PPO | Admitting: Endocrinology

## 2018-05-16 ENCOUNTER — Encounter: Payer: Self-pay | Admitting: Endocrinology

## 2018-05-16 ENCOUNTER — Ambulatory Visit (INDEPENDENT_AMBULATORY_CARE_PROVIDER_SITE_OTHER): Payer: BC Managed Care – PPO | Admitting: Endocrinology

## 2018-05-16 VITALS — BP 136/88 | HR 89 | Ht 60.0 in | Wt 215.4 lb

## 2018-05-16 DIAGNOSIS — E049 Nontoxic goiter, unspecified: Secondary | ICD-10-CM

## 2018-05-16 NOTE — Progress Notes (Signed)
Subjective:    Patient ID: Melissa Valdez, female    DOB: [birth year], 53 y.o.   MRN: [id number]  HPI Pt returns for f/u of large multinodular goiter (dx'ed 2018; she repored sob, but spirometry did not show obstruction; [place] in 2018 showed; no nodule corresponds to the nonspecific appearance of the nuclear medicine study; Multiple nodules meet criteria for biopsy; bx was Beth Cat 2; she is euthyroid off rx).  Pt says she can notice the goiter, and that the size is unchanged Past Medical History:  Diagnosis Date  . Diabetes mellitus without complication ([place])   . GERD (gastroesophageal reflux disease)   . Hypertension   . Obesity, Class III, BMI 40-49.9 (morbid obesity) ([place])   . Sleep apnea    cpap-     Past Surgical History:  Procedure Laterality Date  . boil on leg removed   1980s  . TUBAL LIGATION  1999    Social History   Socioeconomic History  . Marital status: Single    Spouse name: Not on file  . Number of children: Not on file  . Years of education: Not on file  . Highest education level: Not on file  Occupational History  . Not on file  Social Needs  . Financial resource strain: Not on file  . Food insecurity:    Worry: Not on file    Inability: Not on file  . Transportation needs:    Medical: Not on file    Non-medical: Not on file  Tobacco Use  . Smoking status: Current Every Day Smoker    Packs/day: 0.25    Types: Cigarettes  . Smokeless tobacco: Never Used  Substance and Sexual Activity  . Alcohol use: Yes    Alcohol/week: 3.0 standard drinks    Types: 2 Cans of beer, 1 Shots of liquor per week  . Drug use: No  . Sexual activity: Yes    Partners: Male    Birth control/protection: Surgical    Comment: Last Encnounter 4 days ago  Lifestyle  . Physical activity:    Days per week: Not on file    Minutes per session: Not on file  . Stress: Not on file  Relationships  . Social connections:    Talks on phone: Not on file    Gets together: Not on file      Attends religious service: Not on file    Active member of club or organization: Not on file    Attends meetings of clubs or organizations: Not on file    Relationship status: Not on file  . Intimate partner violence:    Fear of current or ex partner: Not on file    Emotionally abused: Not on file    Physically abused: Not on file    Forced sexual activity: Not on file  Other Topics Concern  . Not on file  Social History Narrative  . Not on file    Current Outpatient Medications on File Prior to Visit  Medication Sig Dispense Refill  . ACCU-CHEK AVIVA PLUS test strip Use three times per day 100 each 9  . amLODipine (NORVASC) 10 MG tablet Take 1 tablet (10 mg total) by mouth daily. 90 tablet 1  . glimepiride (AMARYL) 2 MG tablet TAKE 1 TABLET BY MOUTH DAILY BEFORE BREAKFAST 90 tablet 1  . hydrochlorothiazide (HYDRODIURIL) 25 MG tablet Take 1 tablet (25 mg total) by mouth daily. Take on tablet in the morning. 90 tablet 1  . hydroxypropyl methylcellulose /  hypromellose (ISOPTO TEARS / GONIOVISC) 2.5 % ophthalmic solution Place 2 drops into both eyes daily as needed for dry eyes.    . metFORMIN (GLUCOPHAGE) 1000 MG tablet Take 1 tablet (1,000 mg total) by mouth 2 (two) times daily with a meal. 180 tablet 1  . olopatadine (PATANOL) 0.1 % ophthalmic solution Place 1 drop into both eyes 2 (two) times daily. 5 mL 12  . PARoxetine (PAXIL) 10 MG tablet Take 1 tablet (10 mg total) by mouth daily. 30 tablet 5  . Polyethyl Glycol-Propyl Glycol (SYSTANE) 0.4-0.3 % GEL ophthalmic gel Place 1 application into both eyes at bedtime as needed. 1 Bottle 0  . Polyethyl Glycol-Propyl Glycol (SYSTANE) 0.4-0.3 % SOLN Apply 1-2 drops to eye 4 (four) times daily as needed. 1 Bottle 0  . potassium chloride SA (K-DUR,KLOR-CON) 20 MEQ tablet Take 1 tablet (20 mEq total) by mouth daily. 30 tablet 3  . simvastatin (ZOCOR) 10 MG tablet Take 1 tablet (10 mg total) by mouth daily. 90 tablet 1   No current  facility-administered medications on file prior to visit.     No Known Allergies  Family History  Problem Relation Age of Onset  . Hypertension Mother   . Diabetes Father   . Goiter Sister     BP 136/88 (BP Location: Right Arm, Patient Position: Sitting, Cuff Size: Normal)   Pulse 89   Ht 5' (1.524 m)   Wt 215 lb 6.4 oz (97.7 kg)   SpO2 98%   BMI 42.07 kg/m    Review of Systems Denies neck pain.      Objective:   Physical Exam VITAL SIGNS:  See vs page GENERAL: no distress NECK: 10 x normal size goiter is again noted.  Diffuse, with smooth surface.     Lab Results  Component Value Date   TSH 0.772 03/03/2018   T3TOTAL 124 01/16/2017   T4TOTAL 7.6 02/06/2017       Assessment & Plan:  MNG: due for recheck Low-normal TSH: I told pt this makes malignancy less likely  Patient Instructions  Let's recheck the ultrasound.  you will receive a phone call, about a day and time for an appointment. If it is normal, please come back for a follow-up appointment in 1 year.     '

## 2018-05-16 NOTE — Patient Instructions (Addendum)
Let's recheck the ultrasound.  you will receive a phone call, about a day and time for an appointment. If it is normal, please come back for a follow-up appointment in 1 year.

## 2018-05-20 ENCOUNTER — Ambulatory Visit (HOSPITAL_COMMUNITY)
Admission: EM | Admit: 2018-05-20 | Discharge: 2018-05-20 | Disposition: A | Discharge status: Home / Self Care | Payer: BC Managed Care – PPO | Attending: Family Medicine | Admitting: Family Medicine

## 2018-05-20 ENCOUNTER — Encounter (HOSPITAL_COMMUNITY): Payer: Self-pay | Admitting: Emergency Medicine

## 2018-05-20 DIAGNOSIS — N390 Urinary tract infection, site not specified: Secondary | ICD-10-CM

## 2018-05-20 DIAGNOSIS — I10 Essential (primary) hypertension: Secondary | ICD-10-CM | POA: Diagnosis not present

## 2018-05-20 LAB — POCT URINALYSIS DIP (DEVICE)
BILIRUBIN URINE: NEGATIVE
GLUCOSE, UA: NEGATIVE mg/dL
KETONES UR: NEGATIVE mg/dL
Nitrite: POSITIVE — AB
Protein, ur: 100 mg/dL — AB
Specific Gravity, Urine: 1.02 (ref 1.005–1.030)
Urobilinogen, UA: 0.2 mg/dL (ref 0.0–1.0)
pH: 6 (ref 5.0–8.0)

## 2018-05-20 MED ORDER — LIDOCAINE VISCOUS HCL 2 % MT SOLN
OROMUCOSAL | Status: AC
  Filled 2018-05-20: qty 15

## 2018-05-20 MED ORDER — NITROFURANTOIN MONOHYD MACRO 100 MG PO CAPS: 100.0000 mg | ORAL_CAPSULE | Freq: Two times a day (BID) | ORAL | 0 refills | Status: DC

## 2018-05-20 MED ORDER — LIDOCAINE VISCOUS HCL 2 % MT SOLN
15.0000 mL | Freq: Once | OROMUCOSAL | Status: AC
  Administered 2018-05-20: 15 mL via OROMUCOSAL

## 2018-05-20 NOTE — ED Triage Notes (Signed)
Pt c/o itchiness, stinging "down there" pt states it feels like a UTI with urinary frequency.

## 2018-05-20 NOTE — Discharge Instructions (Addendum)
Push fluids Take antibiotic 2 x a day for 5 days Follow up with your PCP We did lab testing during this visit.  If there are any abnormal findings that require change in medicine or indicate a positive result, you will be notified.  If all of your tests are normal, you will not be called.

## 2018-05-20 NOTE — ED Provider Notes (Signed)
[place]    CSN: [id number] Arrival date & time: 05/20/18  1010     History   Chief Complaint Chief Complaint  Patient presents with  . Urinary Frequency    HPI Melissa Valdez is a 53 y.o. female.   HPI  Patient reports burning with urination since yesterday. She also endorses some lower abdominal cramping and urinary frequency. She denies any lower back pain, nausea, vomiting, vaginal discharge or hematuria. She reports she has not taken anything at home to treat her symptoms. She reports feeling febrile this am but did not check her temperature at home.   Past Medical History:  Diagnosis Date  . Diabetes mellitus without complication ([place])   . GERD (gastroesophageal reflux disease)   . Hypertension   . Obesity, Class III, BMI 40-49.9 (morbid obesity) ([place])   . Sleep apnea    cpap-     Patient Active Problem List   Diagnosis Date Noted  . Dyspnea 05/09/2017  . Goiter 05/09/2017  . Cellulitis and abscess of hand, except fingers and thumb 10/28/2014  . Hypokalemia 10/28/2014  . Hyperglycemia 10/28/2014  . Diabetes mellitus type II, controlled, with no complications ([place]) 19/41/7408  . Hypertension 10/28/2014  . Morbid obesity with BMI of 40.0-44.9, adult ([place]) 06/28/2014  . Dysmenorrhea 01/20/2014  . Abnormal uterine bleeding (AUB) 01/20/2014  . Fibroids, intramural 01/19/2014    Past Surgical History:  Procedure Laterality Date  . boil on leg removed   1980s  . TUBAL LIGATION  1999    OB History    Gravida  2   Para  2   Term  2   Preterm      AB      Living  2     SAB      TAB      Ectopic      Multiple      Live Births  2            Home Medications    Prior to Admission medications   Medication Sig Start Date End Date Taking? Authorizing Provider  ACCU-CHEK AVIVA PLUS test strip Use three times per day 02/19/18   Clent Demark, PA-C  amLODipine (NORVASC) 10 MG tablet Take 1 tablet (10 mg total) by mouth daily.  02/19/18   Clent Demark, PA-C  glimepiride (AMARYL) 2 MG tablet TAKE 1 TABLET BY MOUTH DAILY BEFORE BREAKFAST 02/19/18   Clent Demark, PA-C  hydrochlorothiazide (HYDRODIURIL) 25 MG tablet Take 1 tablet (25 mg total) by mouth daily. Take on tablet in the morning. 02/19/18   Clent Demark, PA-C  hydroxypropyl methylcellulose / hypromellose (ISOPTO TEARS / GONIOVISC) 2.5 % ophthalmic solution Place 2 drops into both eyes daily as needed for dry eyes.    [provider]  metFORMIN (GLUCOPHAGE) 1000 MG tablet Take 1 tablet (1,000 mg total) by mouth 2 (two) times daily with a meal. 02/19/18   Clent Demark, PA-C  nitrofurantoin, macrocrystal-monohydrate, (MACROBID) 100 MG capsule Take 1 capsule (100 mg total) by mouth 2 (two) times daily. 05/20/18   Raylene Everts, MD  olopatadine (PATANOL) 0.1 % ophthalmic solution Place 1 drop into both eyes 2 (two) times daily. 05/09/18   Chase Picket, MD  PARoxetine (PAXIL) 10 MG tablet Take 1 tablet (10 mg total) by mouth daily. 02/19/18   Clent Demark, PA-C  Polyethyl Glycol-Propyl Glycol (SYSTANE) 0.4-0.3 % GEL ophthalmic gel Place 1 application into both eyes at bedtime  as needed. 03/04/18   Tasia Catchings, Amy V, PA-C  Polyethyl Glycol-Propyl Glycol (SYSTANE) 0.4-0.3 % SOLN Apply 1-2 drops to eye 4 (four) times daily as needed. 03/04/18   Tasia Catchings, Amy V, PA-C  potassium chloride SA (K-DUR,KLOR-CON) 20 MEQ tablet Take 1 tablet (20 mEq total) by mouth daily. 05/12/18   Ladell Pier, MD  simvastatin (ZOCOR) 10 MG tablet Take 1 tablet (10 mg total) by mouth daily. 02/19/18   Clent Demark, PA-C    Family History Family History  Problem Relation Age of Onset  . Hypertension Mother   . Diabetes Father   . Goiter Sister     Social History Social History   Tobacco Use  . Smoking status: Current Every Day Smoker    Packs/day: 0.25    Types: Cigarettes  . Smokeless tobacco: Never Used  Substance Use Topics  . Alcohol use: Yes     Alcohol/week: 3.0 standard drinks    Types: 2 Cans of beer, 1 Shots of liquor per week  . Drug use: No     Allergies   Patient has no known allergies.   Review of Systems Review of Systems  Constitutional: Negative for chills and fever.  HENT: Negative for ear pain and sore throat.   Eyes: Negative for pain and visual disturbance.  Respiratory: Negative for cough and shortness of breath.   Cardiovascular: Negative for chest pain and palpitations.  Gastrointestinal: Negative for abdominal pain and vomiting.  Genitourinary: Positive for dysuria and frequency. Negative for hematuria, menstrual problem, vaginal bleeding and vaginal discharge.  Musculoskeletal: Negative for arthralgias and back pain.  Skin: Negative for color change and rash.  Neurological: Negative for seizures and syncope.  All other systems reviewed and are negative.    Physical Exam Triage Vital Signs ED Triage Vitals  Enc Vitals Group     BP 05/20/18 1221 (!) 161/83     Pulse Rate 05/20/18 1220 87     Resp 05/20/18 1220 18     Temp 05/20/18 1220 97.8 F (36.6 C)     Temp src --      SpO2 05/20/18 1220 98 %     Weight --      Height --      Head Circumference --      Peak Flow --      Pain Score 05/20/18 1221 10     Pain Loc --      Pain Edu? --      Excl. in [place]? --    No data found.  Updated Vital Signs BP (!) 161/83   Pulse 87   Temp 97.8 F (36.6 C)   Resp 18   SpO2 98%   Physical Exam Constitutional:      General: She is not in acute distress.    Appearance: She is well-developed.  HENT:     Head: Normocephalic and atraumatic.  Eyes:     Conjunctiva/sclera: Conjunctivae normal.     Pupils: Pupils are equal, round, and reactive to light.  Neck:     Musculoskeletal: Normal range of motion.  Cardiovascular:     Rate and Rhythm: Normal rate.  Pulmonary:     Effort: Pulmonary effort is normal. No respiratory distress.  Abdominal:     General: There is no distension.      Palpations: Abdomen is soft.     Tenderness: There is no abdominal tenderness.     Comments: No CVAT  Musculoskeletal: Normal range of motion.  Skin:  General: Skin is warm and dry.  Neurological:     General: No focal deficit present.     Mental Status: She is alert. Mental status is at baseline.      UC Treatments / Results  Labs (all labs ordered are listed, but only abnormal results are displayed) Labs Reviewed  POCT URINALYSIS DIP (DEVICE) - Abnormal; Notable for the following components:      Result Value   Hgb urine dipstick LARGE (*)    Protein, ur 100 (*)    Nitrite POSITIVE (*)    Leukocytes, UA MODERATE (*)    All other components within normal limits    EKG None  Radiology No results found.  Procedures Procedures (including critical care time)  Medications Ordered in UC Medications  lidocaine (XYLOCAINE) 2 % viscous mouth solution 15 mL (15 mLs Mouth/Throat Given 05/20/18 1319)    Initial Impression / Assessment and Plan / UC Course  I have reviewed the triage vital signs and the nursing notes.  Pertinent labs & imaging results that were available during my care of the patient were reviewed by me and considered in my medical decision making (see chart for details).  Clinical Course as of May 20 1400  Tue May 20, 2018  1230 POCT Urinalysis, Dipstick [YN]    Clinical Course User Index [YN] Raylene Everts, MD    Patient is having a lot of dysuria and urethral discomfort.  Ask for medicine for pain.  She was given some viscous lidocaine to use externally for her discomfort. Final Clinical Impressions(s) / UC Diagnoses   Final diagnoses:  Lower urinary tract infectious disease     Discharge Instructions     Push fluids Take antibiotic 2 x a day for 5 days Follow up with your PCP We did lab testing during this visit.  If there are any abnormal findings that require change in medicine or indicate a positive result, you will be notified.  If  all of your tests are normal, you will not be called.       ED Prescriptions    Medication Sig Dispense Auth. Provider   nitrofurantoin, macrocrystal-monohydrate, (MACROBID) 100 MG capsule Take 1 capsule (100 mg total) by mouth 2 (two) times daily. 10 capsule Raylene Everts, MD     Controlled Substance Prescriptions [place] Controlled Substance Registry consulted? Not Applicable   Raylene Everts, MD 05/20/18 [phone]

## 2018-05-22 ENCOUNTER — Ambulatory Visit (INDEPENDENT_AMBULATORY_CARE_PROVIDER_SITE_OTHER): Payer: BC Managed Care – PPO | Admitting: Critical Care Medicine

## 2018-05-22 ENCOUNTER — Encounter: Payer: Self-pay | Admitting: Critical Care Medicine

## 2018-05-22 ENCOUNTER — Encounter (INDEPENDENT_AMBULATORY_CARE_PROVIDER_SITE_OTHER): Payer: Self-pay | Admitting: Critical Care Medicine

## 2018-05-22 ENCOUNTER — Other Ambulatory Visit: Payer: Self-pay

## 2018-05-22 VITALS — BP 133/78 | HR 94 | Temp 99.0°F | Ht 60.0 in | Wt 214.2 lb

## 2018-05-22 DIAGNOSIS — Z1211 Encounter for screening for malignant neoplasm of colon: Secondary | ICD-10-CM

## 2018-05-22 DIAGNOSIS — Z6841 Body Mass Index (BMI) 40.0 and over, adult: Secondary | ICD-10-CM

## 2018-05-22 DIAGNOSIS — Z23 Encounter for immunization: Secondary | ICD-10-CM | POA: Diagnosis not present

## 2018-05-22 DIAGNOSIS — I10 Essential (primary) hypertension: Secondary | ICD-10-CM | POA: Diagnosis not present

## 2018-05-22 DIAGNOSIS — M25561 Pain in right knee: Secondary | ICD-10-CM

## 2018-05-22 DIAGNOSIS — M25562 Pain in left knee: Secondary | ICD-10-CM

## 2018-05-22 DIAGNOSIS — N951 Menopausal and female climacteric states: Secondary | ICD-10-CM | POA: Insufficient documentation

## 2018-05-22 DIAGNOSIS — G8929 Other chronic pain: Secondary | ICD-10-CM

## 2018-05-22 DIAGNOSIS — E119 Type 2 diabetes mellitus without complications: Secondary | ICD-10-CM

## 2018-05-22 LAB — POCT GLYCOSYLATED HEMOGLOBIN (HGB A1C): HEMOGLOBIN A1C: 7.6 % — AB (ref 4.0–5.6)

## 2018-05-22 LAB — GLUCOSE, POCT (MANUAL RESULT ENTRY): POC GLUCOSE: 131 mg/dL — AB (ref 70–99)

## 2018-05-22 MED ORDER — HYDROCHLOROTHIAZIDE 25 MG PO TABS: 25.0000 mg | ORAL_TABLET | Freq: Every day | ORAL | 1 refills | Status: DC

## 2018-05-22 MED ORDER — PAROXETINE HCL 10 MG PO TABS: 10.0000 mg | ORAL_TABLET | Freq: Every day | ORAL | 5 refills | Status: AC

## 2018-05-22 MED ORDER — METFORMIN HCL 1000 MG PO TABS: 1000.0000 mg | ORAL_TABLET | Freq: Two times a day (BID) | ORAL | 1 refills | Status: DC

## 2018-05-22 MED ORDER — ACCU-CHEK SOFT TOUCH LANCETS MISC: 12 refills | Status: AC

## 2018-05-22 MED ORDER — AMLODIPINE BESYLATE 10 MG PO TABS: 10.0000 mg | ORAL_TABLET | Freq: Every day | ORAL | 1 refills | Status: DC

## 2018-05-22 MED ORDER — POTASSIUM CHLORIDE CRYS ER 20 MEQ PO TBCR: 20.0000 meq | EXTENDED_RELEASE_TABLET | Freq: Every day | ORAL | 3 refills | Status: AC

## 2018-05-22 MED ORDER — ACCU-CHEK AVIVA DEVI: 0 refills | Status: AC

## 2018-05-22 MED ORDER — ACCU-CHEK AVIVA PLUS VI STRP: ORAL_STRIP | 9 refills | Status: DC

## 2018-05-22 MED ORDER — SIMVASTATIN 10 MG PO TABS: 10.0000 mg | ORAL_TABLET | Freq: Every day | ORAL | 1 refills | Status: DC

## 2018-05-22 MED ORDER — OLOPATADINE HCL 0.1 % OP SOLN: 1.0000 [drp] | Freq: Two times a day (BID) | OPHTHALMIC | 12 refills | Status: DC

## 2018-05-22 MED ORDER — GLIMEPIRIDE 2 MG PO TABS: ORAL_TABLET | ORAL | 1 refills | Status: AC

## 2018-05-22 NOTE — Progress Notes (Signed)
Subjective:    Patient ID: Melissa Valdez, female    DOB: July 17, 1965, 53 y.o.   MRN: [id number]  53 y.o. female with a medical history of DM2, GERD, HTN, Obesity, and Sleep apnea  Last seen 02/2018  Here for f/u   The patient has had recently gone to urgent care for a urinary tract infection is now on Macrobid  She also complains of bilateral knee pain for which she had seen orthopedics in the past  She has been adherent to her diet and taking her diabetic medications as prescribed  She has no shortness of breath or cough and no chest pain.  She is still smoking 3 to 5 cigarettes/day.  She notes that her diabetic meter is not functioning  She also complains of excessive bleeding with menses however her hot flashes are improved on the Paxil   Last HgbA1C 9.0     Past Medical History:  Diagnosis Date  . Diabetes mellitus without complication ([place])   . GERD (gastroesophageal reflux disease)   . Hypertension   . Obesity, Class III, BMI 40-49.9 (morbid obesity) ([place])   . Sleep apnea    cpap-      Family History  Problem Relation Age of Onset  . Hypertension Mother   . Diabetes Father   . Goiter Sister      Social History   Socioeconomic History  . Marital status: Single    Spouse name: Not on file  . Number of children: Not on file  . Years of education: Not on file  . Highest education level: Not on file  Occupational History  . Not on file  Social Needs  . Financial resource strain: Not on file  . Food insecurity:    Worry: Not on file    Inability: Not on file  . Transportation needs:    Medical: Not on file    Non-medical: Not on file  Tobacco Use  . Smoking status: Current Every Day Smoker    Packs/day: 0.25    Types: Cigarettes  . Smokeless tobacco: Never Used  Substance and Sexual Activity  . Alcohol use: Yes    Alcohol/week: 3.0 standard drinks    Types: 2 Cans of beer, 1 Shots of liquor per week  . Drug use: No  . Sexual activity: Yes   Partners: Male    Birth control/protection: Surgical    Comment: Last Encnounter 4 days ago  Lifestyle  . Physical activity:    Days per week: Not on file    Minutes per session: Not on file  . Stress: Not on file  Relationships  . Social connections:    Talks on phone: Not on file    Gets together: Not on file    Attends religious service: Not on file    Active member of club or organization: Not on file    Attends meetings of clubs or organizations: Not on file    Relationship status: Not on file  . Intimate partner violence:    Fear of current or ex partner: Not on file    Emotionally abused: Not on file    Physically abused: Not on file    Forced sexual activity: Not on file  Other Topics Concern  . Not on file  Social History Narrative  . Not on file     No Known Allergies   Outpatient Medications Prior to Visit  Medication Sig Dispense Refill  . hydroxypropyl methylcellulose / hypromellose (ISOPTO TEARS / GONIOVISC)  2.5 % ophthalmic solution Place 2 drops into both eyes daily as needed for dry eyes.    . nitrofurantoin, macrocrystal-monohydrate, (MACROBID) 100 MG capsule Take 1 capsule (100 mg total) by mouth 2 (two) times daily. 10 capsule 0  . Polyethyl Glycol-Propyl Glycol (SYSTANE) 0.4-0.3 % GEL ophthalmic gel Place 1 application into both eyes at bedtime as needed. 1 Bottle 0  . Polyethyl Glycol-Propyl Glycol (SYSTANE) 0.4-0.3 % SOLN Apply 1-2 drops to eye 4 (four) times daily as needed. 1 Bottle 0  . ACCU-CHEK AVIVA PLUS test strip Use three times per day 100 each 9  . amLODipine (NORVASC) 10 MG tablet Take 1 tablet (10 mg total) by mouth daily. 90 tablet 1  . glimepiride (AMARYL) 2 MG tablet TAKE 1 TABLET BY MOUTH DAILY BEFORE BREAKFAST 90 tablet 1  . hydrochlorothiazide (HYDRODIURIL) 25 MG tablet Take 1 tablet (25 mg total) by mouth daily. Take on tablet in the morning. 90 tablet 1  . metFORMIN (GLUCOPHAGE) 1000 MG tablet Take 1 tablet (1,000 mg total) by mouth 2  (two) times daily with a meal. 180 tablet 1  . olopatadine (PATANOL) 0.1 % ophthalmic solution Place 1 drop into both eyes 2 (two) times daily. 5 mL 12  . PARoxetine (PAXIL) 10 MG tablet Take 1 tablet (10 mg total) by mouth daily. 30 tablet 5  . potassium chloride SA (K-DUR,KLOR-CON) 20 MEQ tablet Take 1 tablet (20 mEq total) by mouth daily. 30 tablet 3  . simvastatin (ZOCOR) 10 MG tablet Take 1 tablet (10 mg total) by mouth daily. 90 tablet 1   No facility-administered medications prior to visit.      Review of Systems  HENT: Negative for congestion, dental problem, drooling, sinus pressure, sinus pain, trouble swallowing and voice change.   Eyes:       Left eye allergy in eye  Eye drops help  Respiratory: Negative for choking, shortness of breath, wheezing and stridor.   Cardiovascular: Negative for chest pain and leg swelling.  Gastrointestinal:       Occ heartburn  Genitourinary: Positive for dysuria and frequency.       Symptoms better since on ABX for UTI this week  Musculoskeletal:       Knee swelling.  Notes chronic pain in knees.  Skin: Negative.   Neurological: Positive for headaches. Negative for dizziness, seizures, weakness, light-headedness and numbness.  Psychiatric/Behavioral: Negative.        Objective:   Physical Exam Vitals:   05/22/18 1335  BP: 133/78  Pulse: 94  Temp: 99 F (37.2 C)  TempSrc: Oral  SpO2: 93%  Weight: 214 lb 3.2 oz (97.2 kg)  Height: 5' (1.524 m)    Wt Readings from Last 3 Encounters:  05/22/18 214 lb 3.2 oz (97.2 kg)  05/16/18 215 lb 6.4 oz (97.7 kg)  02/19/18 220 lb 3.2 oz (99.9 kg)   CBG 131  Gen: Pleasant, obese, in no distress,  normal affect  ENT: No lesions,  mouth clear,  oropharynx clear, no postnasal drip  Neck: No JVD, no TMG, no carotid bruits  Lungs: No use of accessory muscles, no dullness to percussion, clear without rales or rhonchi  Cardiovascular: RRR, heart sounds normal, no murmur or gallops, no  peripheral edema  Abdomen: soft and NT, no HSM,  BS normal  Musculoskeletal: No deformities, no cyanosis or clubbing, both knees tender laterally.   Neuro: alert, non focal  Skin: Warm, no lesions or rashes  HgbA1C 7.6  BMP Latest Ref  Rng & Units 03/03/2018 08/20/2017 01/16/2017  Glucose 65 - 99 mg/dL 118(H) 174(H) 108(H)  BUN 6 - 24 mg/dL '13 8 7  ' Creatinine 0.57 - 1.00 mg/dL 0.51(L) 0.50 0.40(L)  BUN/Creat Ratio 9 - 23 25(H) - -  Sodium 134 - 144 mmol/L 141 138 140  Potassium 3.5 - 5.2 mmol/L 3.1(L) 3.5 3.8  Chloride 96 - 106 mmol/L 94(L) 103 104  CO2 20 - 29 mmol/L 26 25 -  Calcium 8.7 - 10.2 mg/dL 10.6(H) 8.3(L) -   Lab Results  Component Value Date   WBC 10.3 03/03/2018   HGB 13.6 03/03/2018   HCT 42.5 03/03/2018   MCV 85 03/03/2018   PLT 393 03/03/2018     Prior knee films reviewed.    Lipids at goal Lipid Panel     Component Value Date/Time   CHOL 137 03/03/2018 1040   TRIG 110 03/03/2018 1040   HDL 39 (L) 03/03/2018 1040   CHOLHDL 3.5 03/03/2018 1040   LDLCALC 76 03/03/2018 1040       Assessment & Plan:  I personally reviewed all images and lab data in the [facility] system as well as any outside material available during this office visit and agree with the  radiology impressions.   Hypertension There is excellent control of blood pressure on current program  We will continue amlodipine and hydrochlorthiazide  Diabetes mellitus type II, controlled, with no complications Diabetes type 2 with improved control hemoglobin A1c down to 7.6 and there has been some weight loss  We will obtain for the patient a new glucose meter lancets and testing kit  Continue metformin and Amaryl as prescribed  A urine microalbumin will be sent  Fibroids, intramural Uterine fibroids present in the patient will follow-up with her gynecologist in this regard  Morbid obesity with BMI of 40.0-44.9, adult Improvement in weight loss  The patient was encouraged to continue her  current diet for further weight loss  Menopausal syndrome (hot flashes) Postmenopausal hot flashes improved on Paxil  Paxil will be continued  Chronic pain of both knees Chronic pain both knees  Radiographs from March 2019 show significant degenerative disease in both knees  Treatment be continued weight loss and referral to orthopedics to see if an injection will be helpful to the pain   Marykathleen was seen today for follow-up.  Diagnoses and all orders for this visit:  Controlled type 2 diabetes mellitus without complication, unspecified whether long term insulin use (HCC) -     HgB A1c -     Glucose (CBG) -     Microalbumin, urine  Hypertension, unspecified type -     simvastatin (ZOCOR) 10 MG tablet; Take 1 tablet (10 mg total) by mouth daily. -     hydrochlorothiazide (HYDRODIURIL) 25 MG tablet; Take 1 tablet (25 mg total) by mouth daily. Take on tablet in the morning. -     amLODipine (NORVASC) 10 MG tablet; Take 1 tablet (10 mg total) by mouth daily.  Menopausal syndrome (hot flashes) -     PARoxetine (PAXIL) 10 MG tablet; Take 1 tablet (10 mg total) by mouth daily.  Type 2 diabetes mellitus without complication, without long-term current use of insulin (HCC) -     metFORMIN (GLUCOPHAGE) 1000 MG tablet; Take 1 tablet (1,000 mg total) by mouth 2 (two) times daily with a meal. -     glimepiride (AMARYL) 2 MG tablet; TAKE 1 TABLET BY MOUTH DAILY BEFORE BREAKFAST -     ACCU-CHEK AVIVA  PLUS test strip; Use three times per day  Colon cancer screening -     Cancel: Fecal occult blood, imunochemical -     Fecal occult blood, imunochemical; Future  Chronic pain of both knees -     Ambulatory referral to Orthopedic Surgery  Morbid obesity with BMI of 40.0-44.9, adult ([place])  Need for prophylactic vaccination against Streptococcus pneumoniae (pneumococcus) -     Pneumococcal polysaccharide vaccine 23-valent greater than or equal to 2yo subcutaneous/IM  Other orders -      potassium chloride SA (K-DUR,KLOR-CON) 20 MEQ tablet; Take 1 tablet (20 mEq total) by mouth daily. -     olopatadine (PATANOL) 0.1 % ophthalmic solution; Place 1 drop into both eyes 2 (two) times daily. -     Blood Glucose Monitoring Suppl (ACCU-CHEK AVIVA) device; Use as instructed -     Lancets (ACCU-CHEK SOFT TOUCH) lancets; Use as instructed  5 minutes of this visit was related to smoking cessation counseling  The patient will also receive a Pneumovax 23 valent

## 2018-05-22 NOTE — Assessment & Plan Note (Signed)
Diabetes type 2 with improved control hemoglobin A1c down to 7.6 and there has been some weight loss  We will obtain for the patient a new glucose meter lancets and testing kit  Continue metformin and Amaryl as prescribed  A urine microalbumin will be sent

## 2018-05-22 NOTE — Assessment & Plan Note (Signed)
Improvement in weight loss  The patient was encouraged to continue her current diet for further weight loss

## 2018-05-22 NOTE — Patient Instructions (Addendum)
All of your medications were refilled and sent to your [place]  Work on smoking cessation and I recommend picking up the nicotine mini lozenges 2 mg and use 1 of those 3 times daily dissolved in the mouth between the gums and cheek  Finish your course of antibiotics for your bladder infection  We will let you know the results of your urine microalbumin  We will refer you to [place] [facility] to evaluate your knees  Great job on your diabetes with your hemoglobin A1c coming down keep up the good work continue to focus on weight loss in your diet as you are doing  Your blood pressure control is very good we will keep your medicines the same  Return in 3 months to see Melissa Valdez  Make an appointment with your gynecology doctor to have your fibroids in the uterus reevaluated

## 2018-05-22 NOTE — Assessment & Plan Note (Signed)
There is excellent control of blood pressure on current program  We will continue amlodipine and hydrochlorthiazide

## 2018-05-22 NOTE — Assessment & Plan Note (Signed)
Chronic pain both knees  Radiographs from March 2019 show significant degenerative disease in both knees  Treatment be continued weight loss and referral to orthopedics to see if an injection will be helpful to the pain

## 2018-05-22 NOTE — Assessment & Plan Note (Signed)
Uterine fibroids present in the patient will follow-up with her gynecologist in this regard

## 2018-05-22 NOTE — Assessment & Plan Note (Signed)
Postmenopausal hot flashes improved on Paxil  Paxil will be continued

## 2018-05-23 LAB — MICROALBUMIN, URINE: Microalbumin, Urine: 30.1 ug/mL

## 2018-05-23 NOTE — Addendum Note (Signed)
Addended by: [name] on: 05/23/2018 10:41 AM   Modules accepted: Orders

## 2018-05-25 LAB — FECAL OCCULT BLOOD, IMMUNOCHEMICAL: Fecal Occult Bld: NEGATIVE

## 2018-05-26 ENCOUNTER — Ambulatory Visit
Admission: RE | Admit: 2018-05-26 | Discharge: 2018-05-26 | Disposition: A | Discharge status: Home / Self Care | Payer: BC Managed Care – PPO | Source: Ambulatory Visit | Attending: Endocrinology | Admitting: Endocrinology

## 2018-05-26 DIAGNOSIS — E049 Nontoxic goiter, unspecified: Secondary | ICD-10-CM

## 2018-05-28 ENCOUNTER — Telehealth (INDEPENDENT_AMBULATORY_CARE_PROVIDER_SITE_OTHER): Payer: Self-pay

## 2018-05-28 NOTE — Telephone Encounter (Signed)
-----   Message from Elsie Stain, MD sent at 05/25/2018 12:33 PM EST ----- [name], pls let [name] know her fecal occult blood study was negative, low risk colon cancer

## 2018-05-28 NOTE — Telephone Encounter (Signed)
Patient returned call to [facility] and was notified that she only had small amounts of protein in her urine which will improve as she gets her A1c in low 6 range. She is also aware that her FIT was negative, low risk for colon cancer. [name], CMA

## 2018-06-04 ENCOUNTER — Encounter (INDEPENDENT_AMBULATORY_CARE_PROVIDER_SITE_OTHER): Payer: Self-pay | Admitting: Orthopedic Surgery

## 2018-06-04 ENCOUNTER — Ambulatory Visit (INDEPENDENT_AMBULATORY_CARE_PROVIDER_SITE_OTHER): Payer: Self-pay

## 2018-06-04 ENCOUNTER — Ambulatory Visit (INDEPENDENT_AMBULATORY_CARE_PROVIDER_SITE_OTHER): Payer: BC Managed Care – PPO | Admitting: Orthopedic Surgery

## 2018-06-04 DIAGNOSIS — M25562 Pain in left knee: Secondary | ICD-10-CM

## 2018-06-04 DIAGNOSIS — M1711 Unilateral primary osteoarthritis, right knee: Secondary | ICD-10-CM | POA: Diagnosis not present

## 2018-06-04 DIAGNOSIS — M25561 Pain in right knee: Secondary | ICD-10-CM

## 2018-06-04 DIAGNOSIS — M1712 Unilateral primary osteoarthritis, left knee: Secondary | ICD-10-CM | POA: Diagnosis not present

## 2018-06-04 DIAGNOSIS — G8929 Other chronic pain: Secondary | ICD-10-CM

## 2018-06-04 MED ORDER — LIDOCAINE HCL 1 % IJ SOLN
5.0000 mL | INTRAMUSCULAR | Status: AC | PRN
  Administered 2018-06-04: 5 mL

## 2018-06-04 MED ORDER — METHYLPREDNISOLONE ACETATE 40 MG/ML IJ SUSP
40.0000 mg | INTRAMUSCULAR | Status: AC | PRN
  Administered 2018-06-04: 40 mg via INTRA_ARTICULAR

## 2018-06-04 MED ORDER — BUPIVACAINE HCL 0.25 % IJ SOLN
4.0000 mL | INTRAMUSCULAR | Status: AC | PRN
  Administered 2018-06-04: 4 mL via INTRA_ARTICULAR

## 2018-06-04 NOTE — Progress Notes (Signed)
Office Visit Note   Patient: Melissa Valdez           Date of Birth: February 25, 1966           MRN: [id number] Visit Date: 06/04/2018 Requested by: Elsie Stain, MD [address] [place],  [address] PCP: Tawny Asal  Subjective: Chief Complaint  Patient presents with  . Right Knee - Pain  . Left Knee - Pain    HPI: Melissa Valdez is a 53 year old patient with bilateral knee pain right worse than left.'s been going on for a year.  Denies any history of injury.  Patient is type II diabetic.  A1c in November was 9 now it is 7.  Takes ibuprofen as needed.  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  Denies any previous injuries and has not had previous injections.              ROS: All systems reviewed are negative as they relate to the chief complaint within the history of present illness.  Patient denies  fevers or chills.   Assessment & Plan: Visit Diagnoses:  1. Chronic pain of both knees   2. Unilateral primary osteoarthritis, left knee   3. Unilateral primary osteoarthritis, right knee     Plan: Impression is bilateral knee arthritis with end-stage medial compartment arthritis in both knees.  Plan is to inject that right knee today.  If that helps and her blood glucose does not go up too much and we can inject the left one in about a week.  I will see her back as needed but I did encourage her to come back in a week if she derives benefit from this injection.  I think we could alternate cortisone and and gel injections in both knees as a way to postpone her eventual total knee replacement.  Follow-Up Instructions: Return if symptoms worsen or fail to improve.   Orders:  Orders Placed This Encounter  Procedures  . XR KNEE 3 VIEW RIGHT  . XR KNEE 3 VIEW LEFT   No orders of the defined types were placed in this encounter.     Procedures: Large Joint Inj: R knee on 06/04/2018 12:04 PM Indications: diagnostic evaluation, joint  swelling and pain Details: 18 G 1.5 in needle, superolateral approach  Arthrogram: No  Medications: 5 mL lidocaine 1 %; 40 mg methylPREDNISolone acetate 40 MG/ML; 4 mL bupivacaine 0.25 % Outcome: tolerated well, no immediate complications Procedure, treatment alternatives, risks and benefits explained, specific risks discussed. Consent was given by the patient. Immediately prior to procedure a time out was called to verify the correct patient, procedure, equipment, support staff and site/side marked as required. Patient was prepped and draped in the usual sterile fashion.       Clinical Data: No additional findings.  Objective: Vital Signs: There were no vitals taken for this visit.  Physical Exam:   Constitutional: Patient appears well-developed HEENT:  Head: Normocephalic Eyes:EOM are normal Neck: Normal range of motion Cardiovascular: Normal rate Pulmonary/chest: Effort normal Neurologic: Patient is alert Skin: Skin is warm Psychiatric: Patient has normal mood and affect    Ortho Exam: Ortho exam demonstrates slightly increased body mass index with slight varus alignment.  Pedal pulses palpable.  No groin pain with internal X rotation of the leg.  Range of motion both knees is full with no effusion in either knee.  Patient has medial greater than lateral joint line tenderness.  Stable collateral  cruciate ligaments are noted.  Specialty Comments:  No specialty comments available.  Imaging: Xr Knee 3 View Left  Result Date: 06/04/2018 AP lateral merchant left knee reviewed.  Severe bicompartmental osteoarthritis is present worse in the medial compartment.  Slight varus alignment is noted.  Patellofemoral compartment appears relatively spared.  Bone-on-bone changes noted medially.  No fracture or dislocation present  Xr Knee 3 View Right  Result Date: 06/04/2018 AP lateral merchant right knee reviewed.  Severe end-stage bicompartmental arthritis is present worse in the  medial compartment.  Bone-on-bone changes noted in this region.  Patellofemoral compartment is spared.  No fracture dislocation present.    PMFS History: Patient Active Problem List   Diagnosis Date Noted  . Menopausal syndrome (hot flashes) 05/22/2018  . Chronic pain of both knees 05/22/2018  . Goiter 05/09/2017  . Diabetes mellitus type II, controlled, with no complications ([place]) 02/63/7858  . Hypertension 10/28/2014  . Morbid obesity with BMI of 40.0-44.9, adult ([place]) 06/28/2014  . Dysmenorrhea 01/20/2014  . Abnormal uterine bleeding (AUB) 01/20/2014  . Fibroids, intramural 01/19/2014   Past Medical History:  Diagnosis Date  . Diabetes mellitus without complication ([place])   . GERD (gastroesophageal reflux disease)   . Hypertension   . Obesity, Class III, BMI 40-49.9 (morbid obesity) ([place])   . Sleep apnea    cpap-     Family History  Problem Relation Age of Onset  . Hypertension Mother   . Diabetes Father   . Goiter Sister     Past Surgical History:  Procedure Laterality Date  . boil on leg removed   1980s  . TUBAL LIGATION  1999   Social History   Occupational History  . Not on file  Tobacco Use  . Smoking status: Current Every Day Smoker    Packs/day: 0.25    Types: Cigarettes  . Smokeless tobacco: Never Used  Substance and Sexual Activity  . Alcohol use: Yes    Alcohol/week: 3.0 standard drinks    Types: 2 Cans of beer, 1 Shots of liquor per week  . Drug use: No  . Sexual activity: Yes    Partners: Male    Birth control/protection: Surgical    Comment: Last Encnounter 4 days ago

## 2018-06-11 ENCOUNTER — Encounter (INDEPENDENT_AMBULATORY_CARE_PROVIDER_SITE_OTHER): Payer: Self-pay | Admitting: Orthopedic Surgery

## 2018-06-11 ENCOUNTER — Ambulatory Visit (INDEPENDENT_AMBULATORY_CARE_PROVIDER_SITE_OTHER): Payer: BC Managed Care – PPO | Admitting: Orthopedic Surgery

## 2018-06-11 ENCOUNTER — Telehealth (INDEPENDENT_AMBULATORY_CARE_PROVIDER_SITE_OTHER): Payer: Self-pay

## 2018-06-11 DIAGNOSIS — M1712 Unilateral primary osteoarthritis, left knee: Secondary | ICD-10-CM

## 2018-06-11 DIAGNOSIS — M1711 Unilateral primary osteoarthritis, right knee: Secondary | ICD-10-CM

## 2018-06-11 DIAGNOSIS — M170 Bilateral primary osteoarthritis of knee: Secondary | ICD-10-CM

## 2018-06-11 MED ORDER — METHYLPREDNISOLONE ACETATE 40 MG/ML IJ SUSP
40.0000 mg | INTRAMUSCULAR | Status: AC | PRN
  Administered 2018-06-11: 40 mg via INTRA_ARTICULAR

## 2018-06-11 MED ORDER — BUPIVACAINE HCL 0.25 % IJ SOLN
4.0000 mL | INTRAMUSCULAR | Status: AC | PRN
  Administered 2018-06-11: 4 mL via INTRA_ARTICULAR

## 2018-06-11 MED ORDER — LIDOCAINE HCL 1 % IJ SOLN
5.0000 mL | INTRAMUSCULAR | Status: AC | PRN
  Administered 2018-06-11: 5 mL

## 2018-06-11 NOTE — Telephone Encounter (Signed)
Can we please try to get patient approved for bilateral gel injections for knees? Thanks.

## 2018-06-11 NOTE — Progress Notes (Signed)
   Procedure Note  Patient: Melissa Valdez             Date of Birth: 1966/04/07           MRN: [id number]             Visit Date: 06/11/2018  Procedures: Visit Diagnoses: Unilateral primary osteoarthritis, right knee  Unilateral primary osteoarthritis, left knee  Large Joint Inj: L knee on 06/11/2018 11:27 AM Indications: diagnostic evaluation, joint swelling and pain Details: 18 G 1.5 in needle, superolateral approach  Arthrogram: No  Medications: 5 mL lidocaine 1 %; 40 mg methylPREDNISolone acetate 40 MG/ML; 4 mL bupivacaine 0.25 % Outcome: tolerated well, no immediate complications Procedure, treatment alternatives, risks and benefits explained, specific risks discussed. Consent was given by the patient. Immediately prior to procedure a time out was called to verify the correct patient, procedure, equipment, support staff and site/side marked as required. Patient was prepped and draped in the usual sterile fashion.     Patient comes back in for scheduled injection left knee.  Blood glucose manageable after right knee injection.  She did get good relief from that.  We will get her preapproved for bilateral gel injections for when these wear off

## 2018-06-12 ENCOUNTER — Encounter (HOSPITAL_COMMUNITY): Payer: Self-pay | Admitting: Emergency Medicine

## 2018-06-12 ENCOUNTER — Other Ambulatory Visit: Payer: Self-pay

## 2018-06-12 ENCOUNTER — Ambulatory Visit (HOSPITAL_COMMUNITY)
Admission: EM | Admit: 2018-06-12 | Discharge: 2018-06-12 | Disposition: A | Discharge status: Home / Self Care | Payer: BC Managed Care – PPO | Attending: Family Medicine | Admitting: Family Medicine

## 2018-06-12 DIAGNOSIS — H1012 Acute atopic conjunctivitis, left eye: Secondary | ICD-10-CM | POA: Diagnosis not present

## 2018-06-12 MED ORDER — OLOPATADINE HCL 0.1 % OP SOLN: 1.0000 [drp] | Freq: Two times a day (BID) | OPHTHALMIC | 0 refills | Status: DC

## 2018-06-12 NOTE — ED Triage Notes (Signed)
PT reports left eye drainage, itching, sore for 3 days.

## 2018-06-12 NOTE — ED Provider Notes (Signed)
[name]    CSN: [id number] Arrival date & time: 06/12/18  0973     History   Chief Complaint Chief Complaint  Patient presents with  . Eye Drainage    HPI Melissa Valdez is a 53 y.o. female history of hypertension, DM type II, presenting today for evaluation of left eye itching and irritation.  Patient states that for the past 3 days she has had itching, soreness in her eye as well as watery drainage.  Minimal symptoms in right eye.  Denies changes in vision.  Denies contact use, does wear prescription glasses.  States that she has not had her eye exam for her high blood pressure/diabetes in the past year.  Denies associated URI symptoms.  SHe has not tried anything over-the-counter.  HPI  Past Medical History:  Diagnosis Date  . Diabetes mellitus without complication ([place])   . GERD (gastroesophageal reflux disease)   . Hypertension   . Obesity, Class III, BMI 40-49.9 (morbid obesity) ([place])   . Sleep apnea    cpap-     Patient Active Problem List   Diagnosis Date Noted  . Menopausal syndrome (hot flashes) 05/22/2018  . Chronic pain of both knees 05/22/2018  . Goiter 05/09/2017  . Diabetes mellitus type II, controlled, with no complications ([place]) 53/29/9242  . Hypertension 10/28/2014  . Morbid obesity with BMI of 40.0-44.9, adult ([place]) 06/28/2014  . Dysmenorrhea 01/20/2014  . Abnormal uterine bleeding (AUB) 01/20/2014  . Fibroids, intramural 01/19/2014    Past Surgical History:  Procedure Laterality Date  . boil on leg removed   1980s  . TUBAL LIGATION  1999    OB History    Gravida  2   Para  2   Term  2   Preterm      AB      Living  2     SAB      TAB      Ectopic      Multiple      Live Births  2            Home Medications    Prior to Admission medications   Medication Sig Start Date End Date Taking? Authorizing Provider  ACCU-CHEK AVIVA PLUS test strip Use three times per day 05/22/18   Elsie Stain, MD    amLODipine (NORVASC) 10 MG tablet Take 1 tablet (10 mg total) by mouth daily. 05/22/18   Elsie Stain, MD  Blood Glucose Monitoring Suppl (ACCU-CHEK AVIVA) device Use as instructed 05/22/18 05/22/19  Elsie Stain, MD  glimepiride (AMARYL) 2 MG tablet TAKE 1 TABLET BY MOUTH DAILY BEFORE BREAKFAST 05/22/18   Elsie Stain, MD  hydrochlorothiazide (HYDRODIURIL) 25 MG tablet Take 1 tablet (25 mg total) by mouth daily. Take on tablet in the morning. 05/22/18   Elsie Stain, MD  hydroxypropyl methylcellulose / hypromellose (ISOPTO TEARS / GONIOVISC) 2.5 % ophthalmic solution Place 2 drops into both eyes daily as needed for dry eyes.    [provider]  Lancets (ACCU-CHEK SOFT TOUCH) lancets Use as instructed 05/22/18   Elsie Stain, MD  metFORMIN (GLUCOPHAGE) 1000 MG tablet Take 1 tablet (1,000 mg total) by mouth 2 (two) times daily with a meal. 05/22/18   Elsie Stain, MD  nitrofurantoin, macrocrystal-monohydrate, (MACROBID) 100 MG capsule Take 1 capsule (100 mg total) by mouth 2 (two) times daily. 05/20/18   Raylene Everts, MD  olopatadine (PATANOL) 0.1 % ophthalmic solution Place 1  drop into both eyes 2 (two) times daily. 06/12/18   Enijah Furr C, PA-C  PARoxetine (PAXIL) 10 MG tablet Take 1 tablet (10 mg total) by mouth daily. 05/22/18   Elsie Stain, MD  Polyethyl Glycol-Propyl Glycol (SYSTANE) 0.4-0.3 % GEL ophthalmic gel Place 1 application into both eyes at bedtime as needed. 03/04/18   Tasia Catchings, Amy V, PA-C  Polyethyl Glycol-Propyl Glycol (SYSTANE) 0.4-0.3 % SOLN Apply 1-2 drops to eye 4 (four) times daily as needed. 03/04/18   Tasia Catchings, Amy V, PA-C  potassium chloride SA (K-DUR,KLOR-CON) 20 MEQ tablet Take 1 tablet (20 mEq total) by mouth daily. 05/22/18   Elsie Stain, MD  simvastatin (ZOCOR) 10 MG tablet Take 1 tablet (10 mg total) by mouth daily. 05/22/18   Elsie Stain, MD    Family History Family History  Problem Relation Age of Onset  . Hypertension Mother    . Diabetes Father   . Goiter Sister     Social History Social History   Tobacco Use  . Smoking status: Current Every Day Smoker    Packs/day: 0.25    Types: Cigarettes  . Smokeless tobacco: Never Used  Substance Use Topics  . Alcohol use: Yes    Alcohol/week: 3.0 standard drinks    Types: 2 Cans of beer, 1 Shots of liquor per week  . Drug use: No     Allergies   Patient has no known allergies.   Review of Systems Review of Systems  Constitutional: Negative for activity change, appetite change, chills, fatigue and fever.  HENT: Negative for congestion, ear pain, rhinorrhea, sinus pressure, sore throat and trouble swallowing.   Eyes: Positive for discharge and itching. Negative for photophobia, redness and visual disturbance.  Respiratory: Negative for cough, chest tightness and shortness of breath.   Cardiovascular: Negative for chest pain.  Gastrointestinal: Negative for abdominal pain, diarrhea, nausea and vomiting.  Musculoskeletal: Negative for myalgias.  Skin: Negative for rash.  Neurological: Negative for dizziness, light-headedness and headaches.     Physical Exam Triage Vital Signs ED Triage Vitals  Enc Vitals Group     BP 06/12/18 1022 137/72     Pulse Rate 06/12/18 1022 75     Resp 06/12/18 1022 16     Temp 06/12/18 1022 97.8 F (36.6 C)     Temp Source 06/12/18 1022 Oral     SpO2 06/12/18 1022 96 %     Weight --      Height --      Head Circumference --      Peak Flow --      Pain Score 06/12/18 1021 8     Pain Loc --      Pain Edu? --      Excl. in [place]? --    No data found.  Updated Vital Signs BP 137/72   Pulse 75   Temp 97.8 F (36.6 C) (Oral)   Resp 16   LMP 06/07/2018   SpO2 96%   Visual Acuity w/ glasses Right Eye Distance:  20/70 Left Eye Distance:  20/70 Bilateral Distance:  20/70  Right Eye Near:   Left Eye Near:    Bilateral Near:     Physical Exam Vitals signs and nursing note reviewed.  Constitutional:       Appearance: She is well-developed.     Comments: No acute distress  HENT:     Head: Normocephalic and atraumatic.     Ears:     Comments: Bilateral ears  without tenderness to palpation of external auricle, tragus and mastoid, EAC's without erythema or swelling, TM's with good bony landmarks and cone of light. Non erythematous.    Nose: Nose normal.     Mouth/Throat:     Comments: Oral mucosa pink and moist, no tonsillar enlargement or exudate. Posterior pharynx patent and nonerythematous, no uvula deviation or swelling. Normal phonation. Eyes:     Extraocular Movements: Extraocular movements intact.     Conjunctiva/sclera: Conjunctivae normal.     Pupils: Pupils are equal, round, and reactive to light.     Comments: No conjunctival erythema, no corneal ulcer or abrasion observed with fluorescein staining, no obvious drainage observed No photophobia with exam  Neck:     Musculoskeletal: Neck supple.  Cardiovascular:     Rate and Rhythm: Normal rate.  Pulmonary:     Effort: Pulmonary effort is normal. No respiratory distress.  Abdominal:     General: There is no distension.  Musculoskeletal: Normal range of motion.  Skin:    General: Skin is warm and dry.  Neurological:     Mental Status: She is alert and oriented to person, place, and time.      UC Treatments / Results  Labs (all labs ordered are listed, but only abnormal results are displayed) Labs Reviewed - No data to display  EKG None  Radiology No results found.  Procedures Procedures (including critical care time)  Medications Ordered in UC Medications - No data to display  Initial Impression / Assessment and Plan / UC Course  I have reviewed the triage vital signs and the nursing notes.  Pertinent labs & imaging results that were available during my care of the patient were reviewed by me and considered in my medical decision making (see chart for details).     Likely with a viral versus allergic  conjunctivitis, will treat with olopatadine to help with itching and irritation,.  Given this patient's history of diabetes and hypertension without recent eye exam, recommending patient to follow-up with optometrist/ophthalmologist for further evaluation and to obtain annual eye exam.Discussed strict return precautions. Patient verbalized understanding and is agreeable with plan.  Final Clinical Impressions(s) / UC Diagnoses   Final diagnoses:  Allergic conjunctivitis of left eye     Discharge Instructions     Please use olopatadine eyedrops twice daily as needed for itching, irritation and drainage  If you have not had a recent dilated eye exam, I recommended following up with ophthalmology or optometry for further evaluation of this  If you develop changes in vision, eye pain, eye pressure or swelling please go to the emergency room    ED Prescriptions    Medication Sig Dispense Auth. Provider   olopatadine (PATANOL) 0.1 % ophthalmic solution Place 1 drop into both eyes 2 (two) times daily. 5 mL Clotine Heiner C, PA-C     Controlled Substance Prescriptions [place] Controlled Substance Registry consulted? Not Applicable   Janith Lima, [place] 06/12/18 1112

## 2018-06-12 NOTE — Discharge Instructions (Signed)
Please use olopatadine eyedrops twice daily as needed for itching, irritation and drainage  If you have not had a recent dilated eye exam, I recommended following up with ophthalmology or optometry for further evaluation of this  If you develop changes in vision, eye pain, eye pressure or swelling please go to the emergency room

## 2018-06-13 NOTE — Telephone Encounter (Signed)
Noted  

## 2018-06-16 ENCOUNTER — Telehealth (INDEPENDENT_AMBULATORY_CARE_PROVIDER_SITE_OTHER): Payer: Self-pay

## 2018-06-16 NOTE — Telephone Encounter (Signed)
Submitted VOB for SynviscOne, bilateral knee. 

## 2018-06-19 ENCOUNTER — Telehealth (INDEPENDENT_AMBULATORY_CARE_PROVIDER_SITE_OTHER): Payer: Self-pay

## 2018-06-19 NOTE — Telephone Encounter (Signed)
Approved for SynviscOne, bilateral knee. Buy & Bill Covered at 100% after co-pay. Co-pay of $80.00 required PA required PA Approval# [id number] Valid 06/19/2018- 06/19/2019 96.00 units.

## 2018-06-19 NOTE — Telephone Encounter (Signed)
PA required for SynviscOne, bilateral knee. Faxed completed PA form to [employer] at [phone]. 

## 2018-06-24 ENCOUNTER — Ambulatory Visit (HOSPITAL_COMMUNITY)
Admission: EM | Admit: 2018-06-24 | Discharge: 2018-06-24 | Disposition: A | Discharge status: Home / Self Care | Payer: BC Managed Care – PPO | Attending: Emergency Medicine | Admitting: Emergency Medicine

## 2018-06-24 ENCOUNTER — Ambulatory Visit (INDEPENDENT_AMBULATORY_CARE_PROVIDER_SITE_OTHER): Payer: BC Managed Care – PPO

## 2018-06-24 ENCOUNTER — Encounter (HOSPITAL_COMMUNITY): Payer: Self-pay | Admitting: Emergency Medicine

## 2018-06-24 DIAGNOSIS — M25561 Pain in right knee: Secondary | ICD-10-CM

## 2018-06-24 DIAGNOSIS — G8929 Other chronic pain: Secondary | ICD-10-CM | POA: Diagnosis not present

## 2018-06-24 DIAGNOSIS — M1711 Unilateral primary osteoarthritis, right knee: Secondary | ICD-10-CM | POA: Diagnosis not present

## 2018-06-24 MED ORDER — IBUPROFEN 600 MG PO TABS: 600.0000 mg | ORAL_TABLET | Freq: Four times a day (QID) | ORAL | 0 refills | Status: DC | PRN

## 2018-06-24 NOTE — Discharge Instructions (Addendum)
Discontinue the ibuprofen 800 mg.  Try ibuprofen 600 mg combined with 1 g of Tylenol 3-4 times a day as needed for pain, ice after use.  Your x-ray was negative for any acute changes.  It showed arthritis.  Follow-up with your orthopedic surgeon as soon as you possibly can for further management.

## 2018-06-24 NOTE — ED Triage Notes (Signed)
Pt c/o R knee pain, states "it has fluid in it". x3 days. Denies injury, states it just started swelling. Ambulatory with steady gait.

## 2018-06-24 NOTE — ED Provider Notes (Signed)
HPI  SUBJECTIVE:  Melissa Valdez is a 53 y.o. female who presents with 3 days of right knee pain described as burning, sore, constant and "fluid on my knee" starting after she stood for prolonged period of time.  She reports clicking, popping and states that her knees are giving way secondary to pain but this is not new.  She denies trauma, erythema, fevers, body aches, limitation of motion, new distal numbness or tingling.  States that she has had symptoms like this before which was due to her osteoarthritis.  She got a steroid injection on 2/16.  She has follow-up with her orthopedics on 3/23 for additional injections.  She has tried rubbing alcohol, ibuprofen 800 mg.  The ibuprofen helps.  Symptoms are worse with walking, weightbearing.  No antipyretic in the past 4 to 6 hours. Has medical history of diabetes, hypertension, chronic bilateral knee pain due to knee arthritis with end-stage medial compartment arthritis.  She is a smoker.  Last knee x-rays done in December/November.  Patient was seen by her orthopedic surgeon on 2/19 and 2/26.  Had a methylprednisolone injection in her right knee on 2/19  Left knee injected on 2/26.  Past Medical History:  Diagnosis Date  . Diabetes mellitus without complication ([place])   . GERD (gastroesophageal reflux disease)   . Hypertension   . Obesity, Class III, BMI 40-49.9 (morbid obesity) ([place])   . Sleep apnea    cpap-     Past Surgical History:  Procedure Laterality Date  . boil on leg removed   1980s  . TUBAL LIGATION  1999    Family History  Problem Relation Age of Onset  . Hypertension Mother   . Diabetes Father   . Goiter Sister     Social History   Tobacco Use  . Smoking status: Current Every Day Smoker    Packs/day: 0.25    Types: Cigarettes  . Smokeless tobacco: Never Used  Substance Use Topics  . Alcohol use: Yes    Alcohol/week: 3.0 standard drinks    Types: 2 Cans of beer, 1 Shots of liquor per week  . Drug use: No    No  current facility-administered medications for this encounter.   Current Outpatient Medications:  .  ACCU-CHEK AVIVA PLUS test strip, Use three times per day, Disp: 100 each, Rfl: 9 .  amLODipine (NORVASC) 10 MG tablet, Take 1 tablet (10 mg total) by mouth daily., Disp: 90 tablet, Rfl: 1 .  Blood Glucose Monitoring Suppl (ACCU-CHEK AVIVA) device, Use as instructed, Disp: 1 each, Rfl: 0 .  glimepiride (AMARYL) 2 MG tablet, TAKE 1 TABLET BY MOUTH DAILY BEFORE BREAKFAST, Disp: 90 tablet, Rfl: 1 .  hydrochlorothiazide (HYDRODIURIL) 25 MG tablet, Take 1 tablet (25 mg total) by mouth daily. Take on tablet in the morning., Disp: 90 tablet, Rfl: 1 .  hydroxypropyl methylcellulose / hypromellose (ISOPTO TEARS / GONIOVISC) 2.5 % ophthalmic solution, Place 2 drops into both eyes daily as needed for dry eyes., Disp: , Rfl:  .  ibuprofen (ADVIL,MOTRIN) 600 MG tablet, Take 1 tablet (600 mg total) by mouth every 6 (six) hours as needed., Disp: 21 tablet, Rfl: 0 .  Lancets (ACCU-CHEK SOFT TOUCH) lancets, Use as instructed, Disp: 100 each, Rfl: 12 .  metFORMIN (GLUCOPHAGE) 1000 MG tablet, Take 1 tablet (1,000 mg total) by mouth 2 (two) times daily with a meal., Disp: 180 tablet, Rfl: 1 .  olopatadine (PATANOL) 0.1 % ophthalmic solution, Place 1 drop into both eyes 2 (two)  times daily., Disp: 5 mL, Rfl: 0 .  PARoxetine (PAXIL) 10 MG tablet, Take 1 tablet (10 mg total) by mouth daily., Disp: 30 tablet, Rfl: 5 .  Polyethyl Glycol-Propyl Glycol (SYSTANE) 0.4-0.3 % GEL ophthalmic gel, Place 1 application into both eyes at bedtime as needed., Disp: 1 Bottle, Rfl: 0 .  Polyethyl Glycol-Propyl Glycol (SYSTANE) 0.4-0.3 % SOLN, Apply 1-2 drops to eye 4 (four) times daily as needed., Disp: 1 Bottle, Rfl: 0 .  potassium chloride SA (K-DUR,KLOR-CON) 20 MEQ tablet, Take 1 tablet (20 mEq total) by mouth daily., Disp: 30 tablet, Rfl: 3 .  simvastatin (ZOCOR) 10 MG tablet, Take 1 tablet (10 mg total) by mouth daily., Disp: 90 tablet,  Rfl: 1  No Known Allergies   ROS  As noted in HPI.   Physical Exam  BP 134/85   Pulse 79   Temp 97.7 F (36.5 C)   Resp 18   LMP 06/07/2018   SpO2 95%   Constitutional: Well developed, well nourished, no acute distress Eyes:  EOMI, conjunctiva normal bilaterally HENT: Normocephalic, atraumatic,mucus membranes moist Respiratory: Normal inspiratory effort Cardiovascular: Normal rate GI: nondistended skin: No rash, skin intact Musculoskeletal: R Knee ROM baseline for Pt, Flexion/extension intact Patella NT,  Patellar tendon NT, Medial joint tender, Lateral joint NT , Popliteal region NT, Varus LCL stress testing stable, Valgus MCL stress testing stable, McMurray's testing normal , Lachman's negative. Distal NVI with intact baseline sensation / motor / pulse distal to knee.  No appreciable effusion. No erythema. No increased temperature. No crepitus.  Neurologic: Alert & oriented x 3, no focal neuro deficits Psychiatric: Speech and behavior appropriate   ED Course   Medications - No data to display  Orders Placed This Encounter  Procedures  . DG Knee AP/LAT W/Sunrise Right    Standing Status:   Standing    Number of Occurrences:   1    Order Specific Question:   Reason for Exam (SYMPTOM  OR DIAGNOSIS REQUIRED)    Answer:   OA medfial compartment, flare pain r/o acute changes    No results found for this or any previous visit (from the past 24 hour(s)). Dg Knee Ap/lat W/sunrise Right  Result Date: 06/24/2018 CLINICAL DATA:  Knee pain, initial encounter EXAM: RIGHT KNEE 3 VIEWS COMPARISON:  07/12/2017 FINDINGS: Medial joint space narrowing is noted with osteophytic change. Mild lateral joint space and patellofemoral joint degenerative changes are seen. No acute fracture or dislocation is noted. No joint effusion is seen. IMPRESSION: Tricompartmental degenerative change without acute abnormality. Electronically Signed   By: Inez Catalina M.D.   On: 06/24/2018 11:20    ED  Clinical Impression  Chronic pain of right knee  Arthritis of right knee   ED Assessment/Plan   Outside records reviewed.  As noted in HPI.  will check x-ray as she states that she has not had x-rays and 4 months to rule out any acute changes.  No evidence of gout, infection.  Suspect arthritis flare.  Will send home with ibuprofen 600 mg combined with 1 g of Tylenol 3-4 times a day as needed for pain, ice after use, work note for today and tomorrow.  Advised her to contact her orthopedic surgeon to be seen sooner.  Reviewed imaging independently.  Tricompartmental degenerative changes without acute abnormalities.  See radiology report for full details.  Plan as above. Discussed  imaging, MDM, treatment plan, and plan for follow-up with patient. Discussed sn/sx that should prompt return to the ED.  patient agrees with plan.   Meds ordered this encounter  Medications  . ibuprofen (ADVIL,MOTRIN) 600 MG tablet    Sig: Take 1 tablet (600 mg total) by mouth every 6 (six) hours as needed.    Dispense:  21 tablet    Refill:  0    *This clinic note was created using [employer]. Therefore, there may be occasional mistakes despite careful proofreading.   ?    Melynda Ripple, MD 06/25/18 [phone]

## 2018-07-04 ENCOUNTER — Telehealth (INDEPENDENT_AMBULATORY_CARE_PROVIDER_SITE_OTHER): Payer: Self-pay | Admitting: *Deleted

## 2018-07-04 NOTE — Telephone Encounter (Signed)
Asked COVID-19 pre screening questions to patient and they answered no to all. 

## 2018-07-07 ENCOUNTER — Encounter (INDEPENDENT_AMBULATORY_CARE_PROVIDER_SITE_OTHER): Payer: Self-pay

## 2018-07-07 ENCOUNTER — Ambulatory Visit (INDEPENDENT_AMBULATORY_CARE_PROVIDER_SITE_OTHER): Payer: BC Managed Care – PPO | Admitting: Orthopedic Surgery

## 2018-07-07 ENCOUNTER — Other Ambulatory Visit: Payer: Self-pay

## 2018-07-21 ENCOUNTER — Ambulatory Visit (INDEPENDENT_AMBULATORY_CARE_PROVIDER_SITE_OTHER): Payer: BC Managed Care – PPO | Admitting: Orthopedic Surgery

## 2018-08-01 ENCOUNTER — Ambulatory Visit (INDEPENDENT_AMBULATORY_CARE_PROVIDER_SITE_OTHER): Payer: BC Managed Care – PPO | Admitting: Orthopedic Surgery

## 2018-08-01 ENCOUNTER — Other Ambulatory Visit: Payer: Self-pay

## 2018-08-01 ENCOUNTER — Encounter (INDEPENDENT_AMBULATORY_CARE_PROVIDER_SITE_OTHER): Payer: Self-pay | Admitting: Orthopedic Surgery

## 2018-08-01 DIAGNOSIS — M1712 Unilateral primary osteoarthritis, left knee: Secondary | ICD-10-CM

## 2018-08-01 DIAGNOSIS — M1711 Unilateral primary osteoarthritis, right knee: Secondary | ICD-10-CM

## 2018-08-01 MED ORDER — HYLAN G-F 20 48 MG/6ML IX SOSY
48.0000 mg | PREFILLED_SYRINGE | INTRA_ARTICULAR | Status: AC | PRN
  Administered 2018-08-01: 48 mg via INTRA_ARTICULAR

## 2018-08-01 MED ORDER — LIDOCAINE HCL 1 % IJ SOLN
5.0000 mL | INTRAMUSCULAR | Status: AC | PRN
  Administered 2018-08-01: 5 mL

## 2018-08-01 NOTE — Progress Notes (Signed)
   Procedure Note  Patient: Melissa Valdez             Date of Birth: 11/11/1965           MRN: [id number]             Visit Date: 08/01/2018  Procedures: Visit Diagnoses: Unilateral primary osteoarthritis, left knee  Unilateral primary osteoarthritis, right knee  Large Joint Inj: bilateral knee on 08/01/2018 10:30 AM Indications: diagnostic evaluation, joint swelling and pain Details: 18 G 1.5 in needle, superolateral approach  Arthrogram: No  Medications (Right): 5 mL lidocaine 1 %; 48 mg Hylan 48 MG/6ML Medications (Left): 5 mL lidocaine 1 %; 48 mg Hylan 48 MG/6ML Outcome: tolerated well, no immediate complications Procedure, treatment alternatives, risks and benefits explained, specific risks discussed. Consent was given by the patient. Immediately prior to procedure a time out was called to verify the correct patient, procedure, equipment, support staff and site/side marked as required. Patient was prepped and draped in the usual sterile fashion.

## 2018-08-07 ENCOUNTER — Other Ambulatory Visit: Payer: Self-pay | Admitting: Pharmacist

## 2018-08-07 ENCOUNTER — Telehealth (INDEPENDENT_AMBULATORY_CARE_PROVIDER_SITE_OTHER): Payer: Self-pay

## 2018-08-07 DIAGNOSIS — I10 Essential (primary) hypertension: Secondary | ICD-10-CM

## 2018-08-07 MED ORDER — HYDROCHLOROTHIAZIDE 25 MG PO TABS: 25.0000 mg | ORAL_TABLET | Freq: Every day | ORAL | 1 refills | Status: DC

## 2018-08-07 NOTE — Telephone Encounter (Signed)
Cannot really do pain medicine for this chronic problem.  I would go with over-the-counter medication Tylenol ibuprofen.  Could also try some off label use of  CBD.  Cuff problem but cannot really do narcotics for this at this time.

## 2018-08-07 NOTE — Telephone Encounter (Signed)
Please advise. Thanks.  

## 2018-08-07 NOTE — Telephone Encounter (Signed)
Left voice mail requesting pain medication. Patient number [phone]

## 2018-08-07 NOTE — Telephone Encounter (Signed)
IC s/w patient and advised  

## 2018-08-15 ENCOUNTER — Ambulatory Visit (INDEPENDENT_AMBULATORY_CARE_PROVIDER_SITE_OTHER): Payer: BC Managed Care – PPO | Admitting: Primary Care

## 2018-08-15 ENCOUNTER — Ambulatory Visit: Payer: BC Managed Care – PPO | Admitting: Primary Care

## 2018-08-17 ENCOUNTER — Other Ambulatory Visit: Payer: Self-pay

## 2018-08-17 ENCOUNTER — Ambulatory Visit (HOSPITAL_COMMUNITY)
Admission: EM | Admit: 2018-08-17 | Discharge: 2018-08-17 | Disposition: A | Discharge status: Home / Self Care | Payer: BC Managed Care – PPO | Attending: Emergency Medicine | Admitting: Emergency Medicine

## 2018-08-17 ENCOUNTER — Encounter (HOSPITAL_COMMUNITY): Payer: Self-pay

## 2018-08-17 DIAGNOSIS — G8929 Other chronic pain: Secondary | ICD-10-CM | POA: Diagnosis not present

## 2018-08-17 DIAGNOSIS — M25561 Pain in right knee: Secondary | ICD-10-CM | POA: Diagnosis not present

## 2018-08-17 DIAGNOSIS — I10 Essential (primary) hypertension: Secondary | ICD-10-CM | POA: Diagnosis not present

## 2018-08-17 MED ORDER — IBUPROFEN 800 MG PO TABS: 800.0000 mg | ORAL_TABLET | Freq: Three times a day (TID) | ORAL | 0 refills | Status: DC

## 2018-08-17 NOTE — ED Provider Notes (Signed)
[facility]    CSN: [id number] Arrival date & time: 08/17/18  1447     History   Chief Complaint Chief Complaint  Patient presents with  . Knee Pain    HPI Melissa Valdez is a 53 y.o. female.   Melissa Valdez presents with complaints of right knee pain. This is chronic in nature for her but feels worse over the past few days. Worse when she wakes in the morning. Pain is 10/10. Has been taking OTC tylenol and ibuprofen which haven't helped with pain. Received bilateral knee injections with orthopedics 4/17. Left knee improved but right knee did not. States that 800mg  ibuprofen which has been prescribed in the past has previously helped. Intermittent swelling and redness. No fevers. No new injury or trauma. States she is on her feet a lot. No numbness tingling or weakness. States she also has had a hard time sleeping recently as she feels she has insomnia.     ROS per HPI, negative if not otherwise mentioned.      Past Medical History:  Diagnosis Date  . Diabetes mellitus without complication ([place])   . GERD (gastroesophageal reflux disease)   . Hypertension   . Obesity, Class III, BMI 40-49.9 (morbid obesity) ([place])   . Sleep apnea    cpap-     Patient Active Problem List   Diagnosis Date Noted  . Menopausal syndrome (hot flashes) 05/22/2018  . Chronic pain of both knees 05/22/2018  . Goiter 05/09/2017  . Diabetes mellitus type II, controlled, with no complications ([place]) 27/06/5007  . Hypertension 10/28/2014  . Morbid obesity with BMI of 40.0-44.9, adult ([place]) 06/28/2014  . Dysmenorrhea 01/20/2014  . Abnormal uterine bleeding (AUB) 01/20/2014  . Fibroids, intramural 01/19/2014    Past Surgical History:  Procedure Laterality Date  . boil on leg removed   1980s  . TUBAL LIGATION  1999    OB History    Gravida  2   Para  2   Term  2   Preterm      AB      Living  2     SAB      TAB      Ectopic      Multiple      Live Births  2            Home Medications    Prior to Admission medications   Medication Sig Start Date End Date Taking? Authorizing Provider  ACCU-CHEK AVIVA PLUS test strip Use three times per day 05/22/18   Elsie Stain, MD  amLODipine (NORVASC) 10 MG tablet Take 1 tablet (10 mg total) by mouth daily. 05/22/18   Elsie Stain, MD  Blood Glucose Monitoring Suppl (ACCU-CHEK AVIVA) device Use as instructed 05/22/18 05/22/19  Elsie Stain, MD  glimepiride (AMARYL) 2 MG tablet TAKE 1 TABLET BY MOUTH DAILY BEFORE BREAKFAST 05/22/18   Elsie Stain, MD  hydrochlorothiazide (HYDRODIURIL) 25 MG tablet Take 1 tablet (25 mg total) by mouth daily. Take on tablet in the morning. 08/07/18   Charlott Rakes, MD  hydroxypropyl methylcellulose / hypromellose (ISOPTO TEARS / GONIOVISC) 2.5 % ophthalmic solution Place 2 drops into both eyes daily as needed for dry eyes.    [provider]  ibuprofen (ADVIL) 800 MG tablet Take 1 tablet (800 mg total) by mouth 3 (three) times daily. 08/17/18   Zigmund Gottron, NP  Lancets (ACCU-CHEK SOFT TOUCH) lancets Use as instructed 05/22/18   Asencion Noble  E, MD  metFORMIN (GLUCOPHAGE) 1000 MG tablet Take 1 tablet (1,000 mg total) by mouth 2 (two) times daily with a meal. 05/22/18   Elsie Stain, MD  olopatadine (PATANOL) 0.1 % ophthalmic solution Place 1 drop into both eyes 2 (two) times daily. 06/12/18   Wieters, Hallie C, PA-C  PARoxetine (PAXIL) 10 MG tablet Take 1 tablet (10 mg total) by mouth daily. 05/22/18   Elsie Stain, MD  Polyethyl Glycol-Propyl Glycol (SYSTANE) 0.4-0.3 % GEL ophthalmic gel Place 1 application into both eyes at bedtime as needed. 03/04/18   Tasia Catchings, Amy V, PA-C  Polyethyl Glycol-Propyl Glycol (SYSTANE) 0.4-0.3 % SOLN Apply 1-2 drops to eye 4 (four) times daily as needed. 03/04/18   Tasia Catchings, Amy V, PA-C  potassium chloride SA (K-DUR,KLOR-CON) 20 MEQ tablet Take 1 tablet (20 mEq total) by mouth daily. 05/22/18   Elsie Stain, MD  simvastatin (ZOCOR) 10  MG tablet Take 1 tablet (10 mg total) by mouth daily. 05/22/18   Elsie Stain, MD    Family History Family History  Problem Relation Age of Onset  . Hypertension Mother   . Diabetes Father   . Goiter Sister     Social History Social History   Tobacco Use  . Smoking status: Current Every Day Smoker    Packs/day: 0.25    Types: Cigarettes  . Smokeless tobacco: Never Used  Substance Use Topics  . Alcohol use: Yes    Alcohol/week: 3.0 standard drinks    Types: 2 Cans of beer, 1 Shots of liquor per week  . Drug use: No     Allergies   Patient has no known allergies.   Review of Systems Review of Systems   Physical Exam Triage Vital Signs ED Triage Vitals  Enc Vitals Group     BP 08/17/18 1456 (!) 138/97     Pulse Rate 08/17/18 1456 90     Resp 08/17/18 1456 16     Temp 08/17/18 1456 98.3 F (36.8 C)     Temp Source 08/17/18 1456 Oral     SpO2 08/17/18 1456 93 %     Weight --      Height --      Head Circumference --      Peak Flow --      Pain Score 08/17/18 1459 8     Pain Loc --      Pain Edu? --      Excl. in [place]? --    No data found.  Updated Vital Signs BP (!) 138/97 (BP Location: Left Arm)   Pulse 90   Temp 98.3 F (36.8 C) (Oral)   Resp 16   SpO2 93%    Physical Exam Constitutional:      General: She is not in acute distress.    Appearance: She is well-developed. She is obese.  Cardiovascular:     Rate and Rhythm: Normal rate and regular rhythm.     Heart sounds: Normal heart sounds.  Pulmonary:     Effort: Pulmonary effort is normal.     Breath sounds: Normal breath sounds.  Musculoskeletal:     Right knee: She exhibits normal range of motion, no swelling, no effusion, no deformity, no laceration, no erythema, normal alignment, no LCL laxity, normal patellar mobility, no bony tenderness, normal meniscus and no MCL laxity. Tenderness found. Patellar tendon tenderness noted.  Skin:    General: Skin is warm and dry.  Neurological:      Mental  Status: She is alert and oriented to person, place, and time.      UC Treatments / Results  Labs (all labs ordered are listed, but only abnormal results are displayed) Labs Reviewed - No data to display  EKG None  Radiology No results found.  Procedures Procedures (including critical care time)  Medications Ordered in UC Medications - No data to display  Initial Impression / Assessment and Plan / UC Course  I have reviewed the triage vital signs and the nursing notes.  Pertinent labs & imaging results that were available during my care of the patient were reviewed by me and considered in my medical decision making (see chart for details).     Acute on chronic right knee pain. No new injury or trauma.  Patient requests refills of 800mg  ibuprofen as this has helped in the past. Discussed options to help with sleep as well. Has appointment with PCP tomorrow. Encouraged follow up with ortho as needed. Patient verbalized understanding and agreeable to plan.  Ambulatory out of clinic without difficulty.   Final Clinical Impressions(s) / UC Diagnoses   Final diagnoses:  Chronic pain of right knee     Discharge Instructions     Ice and elevation.  Activity as tolerated.  Ibuprofen as needed for pain take with food.  Over the counter benadryl, Unisom or Melatonin (2-5mg ) as needed for sleep.  Continue to follow up with your primary care and your orthopedist.    ED Prescriptions    Medication Sig Dispense Auth. Provider   ibuprofen (ADVIL) 800 MG tablet Take 1 tablet (800 mg total) by mouth 3 (three) times daily. 21 tablet Zigmund Gottron, NP     Controlled Substance Prescriptions  Controlled Substance Registry consulted? Not Applicable   Zigmund Gottron, NP 08/17/18 [phone]

## 2018-08-17 NOTE — ED Triage Notes (Signed)
Pt presents with chronic right knee pain; Pt has got cortisone injections in both knees 2 weeks ago because she doesn't have cartilage in her knees.

## 2018-08-17 NOTE — Discharge Instructions (Signed)
Ice and elevation.  Activity as tolerated.  Ibuprofen as needed for pain take with food.  Over the counter benadryl, Unisom or Melatonin (2-5mg ) as needed for sleep.  Continue to follow up with your primary care and your orthopedist.

## 2018-08-18 ENCOUNTER — Ambulatory Visit: Payer: BC Managed Care – PPO | Admitting: Primary Care

## 2018-08-22 ENCOUNTER — Ambulatory Visit: Payer: BC Managed Care – PPO | Admitting: Nurse Practitioner

## 2018-08-24 ENCOUNTER — Encounter (HOSPITAL_COMMUNITY): Payer: Self-pay | Admitting: *Deleted

## 2018-08-24 ENCOUNTER — Emergency Department (HOSPITAL_COMMUNITY)
Admission: EM | Admit: 2018-08-24 | Discharge: 2018-08-24 | Disposition: A | Discharge status: Home / Self Care | Payer: BC Managed Care – PPO | Attending: Emergency Medicine | Admitting: Emergency Medicine

## 2018-08-24 ENCOUNTER — Other Ambulatory Visit: Payer: Self-pay

## 2018-08-24 ENCOUNTER — Emergency Department (HOSPITAL_COMMUNITY): Payer: BC Managed Care – PPO

## 2018-08-24 DIAGNOSIS — Z7984 Long term (current) use of oral hypoglycemic drugs: Secondary | ICD-10-CM | POA: Insufficient documentation

## 2018-08-24 DIAGNOSIS — I10 Essential (primary) hypertension: Secondary | ICD-10-CM | POA: Insufficient documentation

## 2018-08-24 DIAGNOSIS — M25561 Pain in right knee: Secondary | ICD-10-CM | POA: Diagnosis present

## 2018-08-24 DIAGNOSIS — G8929 Other chronic pain: Secondary | ICD-10-CM | POA: Diagnosis not present

## 2018-08-24 DIAGNOSIS — E119 Type 2 diabetes mellitus without complications: Secondary | ICD-10-CM | POA: Diagnosis not present

## 2018-08-24 DIAGNOSIS — F17210 Nicotine dependence, cigarettes, uncomplicated: Secondary | ICD-10-CM | POA: Insufficient documentation

## 2018-08-24 DIAGNOSIS — Z79899 Other long term (current) drug therapy: Secondary | ICD-10-CM | POA: Insufficient documentation

## 2018-08-24 DIAGNOSIS — R030 Elevated blood-pressure reading, without diagnosis of hypertension: Secondary | ICD-10-CM

## 2018-08-24 MED ORDER — DICLOFENAC SODIUM 1 % TD GEL: 2.0000 g | Freq: Four times a day (QID) | TRANSDERMAL | 0 refills | Status: DC

## 2018-08-24 NOTE — ED Notes (Signed)
Pt returns from radiology. 

## 2018-08-24 NOTE — ED Notes (Signed)
Patient transported to X-ray 

## 2018-08-24 NOTE — ED Triage Notes (Signed)
PT reports Rt knee pain. Two weeks ago pt reports having jell shots in both knees at an Orthopedic office. Pt now has pain to RT knee.

## 2018-08-24 NOTE — ED Notes (Signed)
Patient verbalizes understanding of discharge instructions . Opportunity for questions and answers were provided . Armband removed by staff ,Pt discharged from ED. W/C  offered at D/C  and Declined W/C at D/C and was escorted to lobby by RN.  

## 2018-08-24 NOTE — ED Provider Notes (Signed)
[facility] EMERGENCY DEPARTMENT Provider Note   CSN: [id number] Arrival date & time: 08/24/18  0757    History   Chief Complaint Chief Complaint  Patient presents with  . Knee Pain    HPI Melissa Valdez is a 53 y.o. female.     HPI  Patient is a 53 year old female the past medical history of type 2 diabetes mellitus, obesity, GERD, sleep apnea presenting for right knee pain.  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  She frequently gets joint injections most recently on April 17 at her Orthopedist's office.  She reports that the steroid injection seem to improve the pain in her left knee but not her right knee.  Patient denies any calf tenderness or swelling, erythematous joints, weakness or numbness in the distal right lower extremity, fevers, chills, or history of joint infections.  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  No recent injury. Patient reports that she has been taking ibuprofen 800 mg and various over-the-counter gels and creams applied topically with minimal relief.  She reports that the pain is causing her to have difficulty sleeping.  Patient has an electronic visit with her orthopedist tomorrow.  Patient expresses frustration over dealing with knee pain for so long.  Past Medical History:  Diagnosis Date  . Diabetes mellitus without complication ([place])   . GERD (gastroesophageal reflux disease)   . Hypertension   . Obesity, Class III, BMI 40-49.9 (morbid obesity) ([place])   . Sleep apnea    cpap-     Patient Active Problem List   Diagnosis Date Noted  . Menopausal syndrome (hot flashes) 05/22/2018  . Chronic pain of both knees 05/22/2018  . Goiter  05/09/2017  . Diabetes mellitus type II, controlled, with no complications ([place]) 78/29/5621  . Hypertension 10/28/2014  . Morbid obesity with BMI of 40.0-44.9, adult ([place]) 06/28/2014  . Dysmenorrhea 01/20/2014  . Abnormal uterine bleeding (AUB) 01/20/2014  . Fibroids, intramural 01/19/2014    Past Surgical History:  Procedure Laterality Date  . boil on leg removed   1980s  . TUBAL LIGATION  1999     OB History    Gravida  2   Para  2   Term  2   Preterm      AB      Living  2     SAB      TAB      Ectopic      Multiple      Live Births  2            Home Medications    Prior to Admission medications   Medication Sig Start Date End Date Taking? Authorizing Provider  ACCU-CHEK AVIVA PLUS test strip Use three times per day 05/22/18   Elsie Stain, MD  amLODipine (NORVASC) 10 MG tablet Take 1 tablet (10 mg total) by mouth daily. 05/22/18   Elsie Stain, MD  Blood Glucose Monitoring Suppl (ACCU-CHEK AVIVA) device Use as instructed 05/22/18 05/22/19  Elsie Stain, MD  glimepiride (AMARYL) 2 MG tablet TAKE 1 TABLET BY MOUTH DAILY BEFORE BREAKFAST 05/22/18   Elsie Stain, MD  hydrochlorothiazide (HYDRODIURIL) 25 MG  tablet Take 1 tablet (25 mg total) by mouth daily. Take on tablet in the morning. 08/07/18   Charlott Rakes, MD  hydroxypropyl methylcellulose / hypromellose (ISOPTO TEARS / GONIOVISC) 2.5 % ophthalmic solution Place 2 drops into both eyes daily as needed for dry eyes.    [provider]  ibuprofen (ADVIL) 800 MG tablet Take 1 tablet (800 mg total) by mouth 3 (three) times daily. 08/17/18   Zigmund Gottron, NP  Lancets (ACCU-CHEK SOFT TOUCH) lancets Use as instructed 05/22/18   Elsie Stain, MD  metFORMIN (GLUCOPHAGE) 1000 MG tablet Take 1 tablet (1,000 mg total) by mouth 2 (two) times daily with a meal. 05/22/18   Elsie Stain, MD  olopatadine (PATANOL) 0.1 % ophthalmic solution Place 1 drop into both eyes 2 (two) times daily.  06/12/18   Wieters, Hallie C, PA-C  PARoxetine (PAXIL) 10 MG tablet Take 1 tablet (10 mg total) by mouth daily. 05/22/18   Elsie Stain, MD  Polyethyl Glycol-Propyl Glycol (SYSTANE) 0.4-0.3 % GEL ophthalmic gel Place 1 application into both eyes at bedtime as needed. 03/04/18   Tasia Catchings, Amy V, PA-C  Polyethyl Glycol-Propyl Glycol (SYSTANE) 0.4-0.3 % SOLN Apply 1-2 drops to eye 4 (four) times daily as needed. 03/04/18   Tasia Catchings, Amy V, PA-C  potassium chloride SA (K-DUR,KLOR-CON) 20 MEQ tablet Take 1 tablet (20 mEq total) by mouth daily. 05/22/18   Elsie Stain, MD  simvastatin (ZOCOR) 10 MG tablet Take 1 tablet (10 mg total) by mouth daily. 05/22/18   Elsie Stain, MD    Family History Family History  Problem Relation Age of Onset  . Hypertension Mother   . Diabetes Father   . Goiter Sister     Social History Social History   Tobacco Use  . Smoking status: Current Every Day Smoker    Packs/day: 0.25    Types: Cigarettes  . Smokeless tobacco: Never Used  Substance Use Topics  . Alcohol use: Yes    Alcohol/week: 3.0 standard drinks    Types: 2 Cans of beer, 1 Shots of liquor per week  . Drug use: No     Allergies   Patient has no known allergies.   Review of Systems Review of Systems  Constitutional: Negative for chills and fever.  Musculoskeletal: Positive for arthralgias and joint swelling. Negative for myalgias.  Skin: Negative for color change.  Neurological: Negative for weakness and numbness.     Physical Exam Updated Vital Signs BP (!) 148/84 (BP Location: Right Arm)   Pulse 89   Temp 98 F (36.7 C) (Oral)   Resp 20   Ht 5' (1.524 m)   Wt 97.1 kg   SpO2 98%   BMI 41.79 kg/m   Physical Exam Vitals signs and nursing note reviewed.  Constitutional:      General: She is not in acute distress.    Appearance: She is well-developed. She is obese. She is not diaphoretic.     Comments: Sitting comfortably in bed.  HENT:     Head: Normocephalic and  atraumatic.  Eyes:     General:        Right eye: No discharge.        Left eye: No discharge.     Conjunctiva/sclera: Conjunctivae normal.     Comments: EOMs normal to gross examination.  Neck:     Musculoskeletal: Normal range of motion.  Cardiovascular:     Rate and Rhythm: Normal rate and regular rhythm.  Comments: Intact, 2+ DP pulses, symmetric bilaterally. Pulmonary:     Comments: Converses comfortably. No audible wheeze or stridor.  Abdominal:     General: There is no distension.  Musculoskeletal:     Comments: Right knee with tenderness to palpation of medial joint. Decreased ROM 2/2 pain, but patient is able to perform both passive and active extension without difficulty. No joint line tenderness. Mild swelling to superior medial knee but appears symmetric with left. No abnormal alignment or patellar mobility. No bruising, erythema or warmth overlaying the joint. No varus/valgus laxity. Negative drawer's, Lachman's and McMurray's.  No crepitus.  2+ DP pulses bilaterally. All compartments are soft. Sensation intact distal to injury. No right calf TTP.  Skin:    General: Skin is warm and dry.  Neurological:     Mental Status: She is alert.     Comments: Cranial nerves intact to gross observation. Patient moves extremities without difficulty.  Psychiatric:        Behavior: Behavior normal.        Thought Content: Thought content normal.        Judgment: Judgment normal.      ED Treatments / Results  Labs (all labs ordered are listed, but only abnormal results are displayed) Labs Reviewed - No data to display  EKG None  Radiology No results found.  Procedures Procedures (including critical care time)  Medications Ordered in ED Medications - No data to display   Initial Impression / Assessment and Plan / ED Course  I have reviewed the triage vital signs and the nursing notes.  Pertinent labs & imaging results that were available during my care of the  patient were reviewed by me and considered in my medical decision making (see chart for details).        This is a well-appearing 53 year old female past medical history of type 2 diabetes, hypertension, and obesity presenting for chronic pain of the right knee.  Records reviewed, patient's examination does not appear changed from prior.  Patient does not take blood thinners, therefore doubt spontaneous hemarthrosis.  Patient does not have any signs or symptoms of septic arthritis of the right knee.  Low suspicion for DVT, as patient's pain is all on the anterior knee.  Based on imaging, appears the patient has very advanced osteoarthritis of the knee. Joint injection already performed recently and there does not appear to be a large effusion. Patient was informed that the risks outweigh benefit of therapeutic arthrocentesis. Will have patient discontinue ibuprofen as she reports it is not working and could be contributory to HTN, and have patient exclusively use acetaminophen. Will add diclofenac gel. Patient instructed not to combine NSAIDs with diclofenac gel.  Patient was given return precautions for any erythematous joints, inability to move the joint, fever or chills or sudden worsening in swelling.  Patient is in understanding and agrees with the plan of care.  Final Clinical Impressions(s) / ED Diagnoses   Final diagnoses:  Chronic pain of right knee  Elevated blood pressure reading    ED Discharge Orders    None       Tamala Julian 08/24/18 2094    Noemi Chapel, MD 08/25/18 [phone]

## 2018-08-24 NOTE — Discharge Instructions (Signed)
It was my pleasure taking care of you today!   Please see the information and instructions below regarding your visit.  Your diagnoses today include:  1. Chronic pain of right knee   2. Elevated blood pressure reading   .  Tests performed today include: See side panel of your discharge paperwork for testing performed today. Vital signs are listed at the bottom of these instructions.   Medications prescribed:    Take any prescribed medications only as prescribed, and any over the counter medications only as directed on the packaging.  Please use acetaminophen/Tylenol every 6 hours 650 mg as needed for pain.  Do not exceed 4000 mg in 1 day as this can cause toxicity to the liver.  Please apply the diclofenac gel I prescribed 2-4 times a day.  This medication should not be combined with other oral nonsteroidal anti-inflammatory medication such as ibuprofen, naproxen, meloxicam or diclofenac.  Home care instructions:  Please follow any educational materials contained in this packet.   Continue to wear Ace wrap over the knee when you are mobile and ambulatory.  Elevate your leg at the end of the day.  Apply ice to the medial compartment of your knee at the end of the day, 10 minutes on, 10 minutes off and ensure that you have a towel in between your skin and the ice to prevent superficial nerve damage.  Follow-up instructions:  Follow up with Dr. Marlou Sa as scheduled.   Return instructions:  Please return to the Emergency Department if you experience worsening symptoms.  Please return for any increasing swelling, increasing pain, loss of color to the lower leg, or increasing redness. Please return if you have any other emergent concerns.  Additional Information:   Your vital signs today were: BP (!) 148/84 (BP Location: Right Arm)    Pulse 89    Temp 98 F (36.7 C) (Oral)    Resp 20    Ht 5' (1.524 m)    Wt 97.1 kg    SpO2 98%    BMI 41.79 kg/m  If your blood pressure (BP) was  elevated on multiple readings during this visit above 130 for the top number or above 80 for the bottom number, please have this repeated by your primary care provider within one month. --------------

## 2018-08-25 ENCOUNTER — Ambulatory Visit: Payer: BC Managed Care – PPO | Attending: Primary Care | Admitting: Primary Care

## 2018-08-25 ENCOUNTER — Encounter: Payer: Self-pay | Admitting: Primary Care

## 2018-08-25 ENCOUNTER — Other Ambulatory Visit: Payer: Self-pay

## 2018-08-25 DIAGNOSIS — M25561 Pain in right knee: Secondary | ICD-10-CM

## 2018-08-25 DIAGNOSIS — E119 Type 2 diabetes mellitus without complications: Secondary | ICD-10-CM

## 2018-08-25 DIAGNOSIS — M25562 Pain in left knee: Secondary | ICD-10-CM

## 2018-08-25 DIAGNOSIS — I10 Essential (primary) hypertension: Secondary | ICD-10-CM | POA: Diagnosis not present

## 2018-08-25 DIAGNOSIS — R51 Headache: Secondary | ICD-10-CM

## 2018-08-25 DIAGNOSIS — G8929 Other chronic pain: Secondary | ICD-10-CM

## 2018-08-25 DIAGNOSIS — Z6841 Body Mass Index (BMI) 40.0 and over, adult: Secondary | ICD-10-CM

## 2018-08-25 DIAGNOSIS — E669 Obesity, unspecified: Secondary | ICD-10-CM

## 2018-08-25 NOTE — Progress Notes (Signed)
Virtual Visit via Telephone Note  I connected with Melissa Valdez on 08/25/18 at  3:10 PM EDT by telephone and verified that I am speaking with the correct person using two identifiers.   I discussed the limitations, risks, security and privacy concerns of performing an evaluation and management service by telephone and the availability of in person appointments. I also discussed with the patient that there may be a patient responsible charge related to this service. The patient expressed understanding and agreed to proceed.   History of Present Illness: Melissa Valdez major concern is bilateral knee pain she has been to the ED twice in the last week. She has a  past medical history of type 2 diabetes mellitus, obesity, GERD, sleep apnea and bilateral knee pain. She wants knee TKR discussed her being a high risk candidate she needed to loose weight , review A1C and Bp wnl for the most optimal out come.   Observations/Objective: Review of Systems  Constitutional: Negative.   HENT: Negative.   Eyes: Negative.   Respiratory: Negative.   Cardiovascular: Negative.   Gastrointestinal: Negative.   Genitourinary: Negative.   Musculoskeletal:       Bilateral knee pain  Skin: Negative.   Neurological: Positive for headaches.  Endo/Heme/Allergies: Negative.   Psychiatric/Behavioral: Negative.     Assessment and Plan: Carrera was seen today for hypertension and diabetes.  Diagnoses and all orders for this visit:  Controlled type 2 diabetes mellitus without complication, without long-term current use of insulin ([place]) follow-up on  diabetic mellitus. Denies any Hypoglycemia/hyperglycermic symptoms   Hemoglobin A1c; Future  -     CBC with Differential/Platelet; Future -     Lipid panel; Future -     Hemoglobin A1c; Future  Adult BMI 40.0-44.9 kg/sq m ([facility]) Weight loss -     Comprehensive metabolic panel; Future  Essential hypertension  This is a chronic problem. The current episode started  more than 1 year ago. Progression since onset: unknown will be seen bp ck. Associated symptoms include anxiety and headaches. Risk factors for coronary artery disease include sedentary lifestyle, stress, diabetes mellitus and obesity. Past treatments include ACE inhibitors and lifestyle changes. The current treatment provides mild improvement. Compliance problems include exercise.  Identifiable causes of hypertension include sleep apnea.   Chronic pain of both knees Discusses her mobid obesity that contributed to knee pain and the need to loose weight in order for a optimal surgical out come. D/w isometric exercise and reducing carbs   Follow Up Instructions:    I discussed the assessment and treatment plan with the patient. The patient was provided an opportunity to ask questions and all were answered. The patient agreed with the plan and demonstrated an understanding of the instructions.   The patient was advised to call back or seek an in-person evaluation if the symptoms worsen or if the condition fails to improve as anticipated.  I provided 30 minutes of non-face-to-face time during this encounter.   Kerin Perna, NP

## 2018-08-27 ENCOUNTER — Encounter: Payer: Self-pay | Admitting: Orthopedic Surgery

## 2018-08-27 ENCOUNTER — Other Ambulatory Visit: Payer: Self-pay

## 2018-08-27 ENCOUNTER — Ambulatory Visit: Payer: BC Managed Care – PPO | Admitting: Orthopedic Surgery

## 2018-08-27 DIAGNOSIS — M1711 Unilateral primary osteoarthritis, right knee: Secondary | ICD-10-CM | POA: Diagnosis not present

## 2018-08-27 NOTE — Progress Notes (Signed)
Office Visit Note   Patient: Melissa Valdez           Date of Birth: [birth year]           MRN: [id number] Visit Date: 08/27/2018 Requested by: Clent Demark, PA-C No address on file PCP: Clent Demark, PA-C  Subjective: Chief Complaint  Patient presents with  . Right Knee - Pain    HPI: Melissa Valdez is a patient with right knee pain.  She has known medial compartment arthritis.  She had a gel injection about 3 weeks ago and it did not help her much.  She has been to the urgent care since then.  Localizing pain to the medial aspect of the knee.  Last cortisone injection was in February.              ROS: All systems reviewed are negative as they relate to the chief complaint within the history of present illness.  Patient denies  fevers or chills.   Assessment & Plan: Visit Diagnoses:  1. Unilateral primary osteoarthritis, right knee     Plan: Impression is end-stage right knee arthritis with medial compartment arthritis.  Plan is cortisone injection today to try to get back close to her baseline.  I think she is going to need total knee replacement sometime in the future.  We will see how she does with this and I will see her back as needed.  Continue with non-loadbearing quad strengthening exercises.  Follow-Up Instructions: Return if symptoms worsen or fail to improve.   Orders:  No orders of the defined types were placed in this encounter.  No orders of the defined types were placed in this encounter.     Procedures: Large Joint Inj: R knee on 08/27/2018 3:03 PM Indications: diagnostic evaluation, joint swelling and pain Details: 18 G 1.5 in needle, superolateral approach  Arthrogram: No  Medications: 5 mL lidocaine 1 %; 40 mg methylPREDNISolone acetate 40 MG/ML; 4 mL bupivacaine 0.25 % Outcome: tolerated well, no immediate complications Procedure, treatment alternatives, risks and benefits explained, specific risks discussed. Consent was given by the patient.  Immediately prior to procedure a time out was called to verify the correct patient, procedure, equipment, support staff and site/side marked as required. Patient was prepped and draped in the usual sterile fashion.       Clinical Data: No additional findings.  Objective: Vital Signs: There were no vitals taken for this visit.  Physical Exam:   Constitutional: Patient appears well-developed HEENT:  Head: Normocephalic Eyes:EOM are normal Neck: Normal range of motion Cardiovascular: Normal rate Pulmonary/chest: Effort normal Neurologic: Patient is alert Skin: Skin is warm Psychiatric: Patient has normal mood and affect    Ortho Exam: Ortho exam demonstrates full active and passive range of motion of the ankle and hip.  The right knee has about a 5 degree flexion contracture with medial joint line tenderness and intact extensor mechanism.  Collateral crucial ligaments are stable.  Flexion is past 90.  Specialty Comments:  No specialty comments available.  Imaging: No results found.   PMFS History: Patient Active Problem List   Diagnosis Date Noted  . Menopausal syndrome (hot flashes) 05/22/2018  . Chronic pain of both knees 05/22/2018  . Goiter 05/09/2017  . Diabetes mellitus type II, controlled, with no complications ([place]) 28/78/6767  . Hypertension 10/28/2014  . Morbid obesity with BMI of 40.0-44.9, adult ([place]) 06/28/2014  . Dysmenorrhea 01/20/2014  . Abnormal uterine bleeding (AUB) 01/20/2014  . Fibroids, intramural 01/19/2014  Past Medical History:  Diagnosis Date  . Diabetes mellitus without complication ([place])   . GERD (gastroesophageal reflux disease)   . Hypertension   . Obesity, Class III, BMI 40-49.9 (morbid obesity) ([place])   . Sleep apnea    cpap-     Family History  Problem Relation Age of Onset  . Hypertension Mother   . Diabetes Father   . Goiter Sister     Past Surgical History:  Procedure Laterality Date  . boil on leg removed   1980s  .  TUBAL LIGATION  1999   Social History   Occupational History  . Not on file  Tobacco Use  . Smoking status: Current Every Day Smoker    Packs/day: 0.25    Types: Cigarettes  . Smokeless tobacco: Never Used  Substance and Sexual Activity  . Alcohol use: Yes    Alcohol/week: 3.0 standard drinks    Types: 2 Cans of beer, 1 Shots of liquor per week  . Drug use: No  . Sexual activity: Yes    Partners: Male    Birth control/protection: Surgical    Comment: Last Encnounter 4 days ago

## 2018-08-28 ENCOUNTER — Ambulatory Visit: Payer: BC Managed Care – PPO | Attending: Family Medicine

## 2018-08-28 DIAGNOSIS — I10 Essential (primary) hypertension: Secondary | ICD-10-CM

## 2018-08-28 DIAGNOSIS — E119 Type 2 diabetes mellitus without complications: Secondary | ICD-10-CM

## 2018-08-28 DIAGNOSIS — Z6841 Body Mass Index (BMI) 40.0 and over, adult: Secondary | ICD-10-CM

## 2018-08-28 MED ORDER — METHYLPREDNISOLONE ACETATE 40 MG/ML IJ SUSP
40.0000 mg | INTRAMUSCULAR | Status: AC | PRN
  Administered 2018-08-27: 15:00:00 40 mg via INTRA_ARTICULAR

## 2018-08-28 MED ORDER — BUPIVACAINE HCL 0.25 % IJ SOLN
4.0000 mL | INTRAMUSCULAR | Status: AC | PRN
  Administered 2018-08-27: 15:00:00 4 mL via INTRA_ARTICULAR

## 2018-08-28 MED ORDER — LIDOCAINE HCL 1 % IJ SOLN
5.0000 mL | INTRAMUSCULAR | Status: AC | PRN
  Administered 2018-08-27: 5 mL

## 2018-08-29 LAB — CBC WITH DIFFERENTIAL/PLATELET
Basophils Absolute: 0 10*3/uL (ref 0.0–0.2)
Basos: 0 %
EOS (ABSOLUTE): 0.1 10*3/uL (ref 0.0–0.4)
Eos: 0 %
Hematocrit: 42.4 % (ref 34.0–46.6)
Hemoglobin: 13.7 g/dL (ref 11.1–15.9)
Immature Grans (Abs): 0 10*3/uL (ref 0.0–0.1)
Immature Granulocytes: 0 %
Lymphocytes Absolute: 2.1 10*3/uL (ref 0.7–3.1)
Lymphs: 10 %
MCH: 27.6 pg (ref 26.6–33.0)
MCHC: 32.3 g/dL (ref 31.5–35.7)
MCV: 85 fL (ref 79–97)
Monocytes Absolute: 0.7 10*3/uL (ref 0.1–0.9)
Monocytes: 3 %
Neutrophils Absolute: 17.1 10*3/uL — ABNORMAL HIGH (ref 1.4–7.0)
Neutrophils: 87 %
Platelets: 350 10*3/uL (ref 150–450)
RBC: 4.97 x10E6/uL (ref 3.77–5.28)
RDW: 14.6 % (ref 11.7–15.4)
WBC: 20 10*3/uL (ref 3.4–10.8)

## 2018-08-29 LAB — COMPREHENSIVE METABOLIC PANEL
ALT: 29 IU/L (ref 0–32)
AST: 18 IU/L (ref 0–40)
Albumin/Globulin Ratio: 1.7 (ref 1.2–2.2)
Albumin: 4.7 g/dL (ref 3.8–4.9)
Alkaline Phosphatase: 107 IU/L (ref 39–117)
BUN/Creatinine Ratio: 24 — ABNORMAL HIGH (ref 9–23)
BUN: 14 mg/dL (ref 6–24)
Bilirubin Total: 0.3 mg/dL (ref 0.0–1.2)
CO2: 21 mmol/L (ref 20–29)
Calcium: 10.1 mg/dL (ref 8.7–10.2)
Chloride: 93 mmol/L — ABNORMAL LOW (ref 96–106)
Creatinine, Ser: 0.59 mg/dL (ref 0.57–1.00)
GFR calc Af Amer: 121 mL/min/{1.73_m2} (ref >59)
GFR calc non Af Amer: 105 mL/min/{1.73_m2} (ref >59)
Globulin, Total: 2.7 g/dL (ref 1.5–4.5)
Glucose: 254 mg/dL — ABNORMAL HIGH (ref 65–99)
Potassium: 3.9 mmol/L (ref 3.5–5.2)
Sodium: 134 mmol/L (ref 134–144)
Total Protein: 7.4 g/dL (ref 6.0–8.5)

## 2018-08-29 LAB — LIPID PANEL
Chol/HDL Ratio: 2.6 ratio (ref 0.0–4.4)
Cholesterol, Total: 174 mg/dL (ref 100–199)
HDL: 68 mg/dL (ref >39)
LDL Calculated: 88 mg/dL (ref 0–99)
Triglycerides: 88 mg/dL (ref 0–149)
VLDL Cholesterol Cal: 18 mg/dL (ref 5–40)

## 2018-08-29 LAB — HEMOGLOBIN A1C
Est. average glucose Bld gHb Est-mCnc: 220 mg/dL
Hgb A1c MFr Bld: 9.3 % — ABNORMAL HIGH (ref 4.8–5.6)

## 2018-09-03 ENCOUNTER — Telehealth: Payer: Self-pay | Admitting: *Deleted

## 2018-09-03 ENCOUNTER — Other Ambulatory Visit: Payer: Self-pay | Admitting: Primary Care

## 2018-09-03 DIAGNOSIS — E08649 Diabetes mellitus due to underlying condition with hypoglycemia without coma: Secondary | ICD-10-CM

## 2018-09-03 NOTE — Telephone Encounter (Signed)
Patient verified DOB Patient is aware of A1C rising to 9.3 and being out of control. Patient is aware of CPP reaching out for DM management and a referral being placed to dietician. All other labs are normal. Patient has been consistent with her diet for 2 weeks and will continue doing her best.

## 2018-09-03 NOTE — Telephone Encounter (Signed)
-----   Message from Kerin Perna, NP sent at 09/03/2018 10:02 AM EDT ----- Elevate A1C from 7.6 much better till 9.3 plz schedule appt with Lurena Joiner diabetes management will refer to a chol unremarkable other labs ok

## 2018-09-29 IMAGING — CT CT NECK W/ CM
4 of 5 series · 15 of 33 positions shown, 17 images · IV contrast (iopamidol)
Comparison: None.

CLINICAL DATA: Difficulty swallowing with throat soreness.

EXAM:
CT NECK WITH CONTRAST
TECHNIQUE: Multidetector CT imaging of the neck was performed using the
standard protocol following the bolus administration of intravenous
contrast.
CONTRAST:  75mL AXMVP7-NCC IOPAMIDOL (AXMVP7-NCC) INJECTION 61%

[Series 4: neck 2.0 st · axial · 0.38mm/px · z∈[-235,-109]mm · 4 of 106 slices shown, 5 images (1 of 3)]
[im 22/106  soft-tissue]
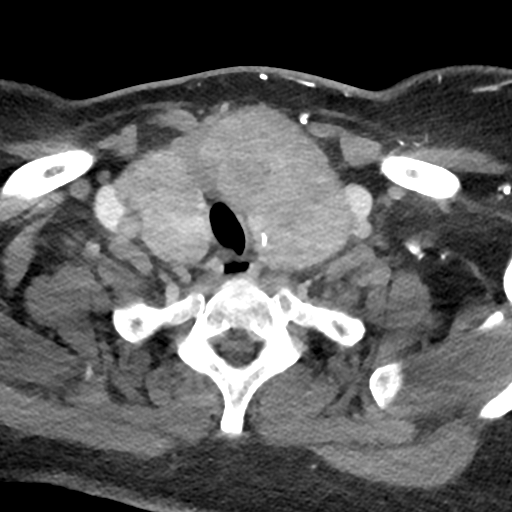
[im 22/106  bone]
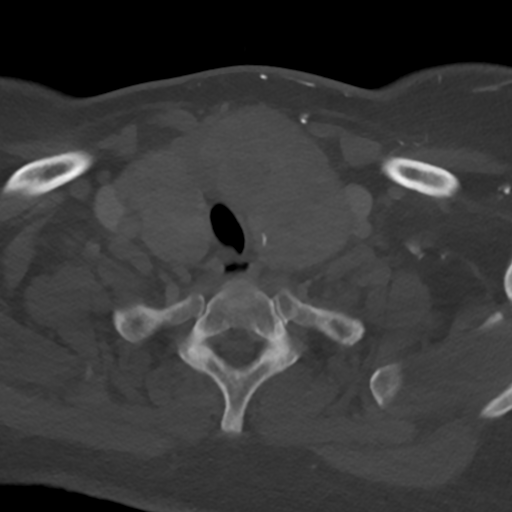
[im 43/106  bone]
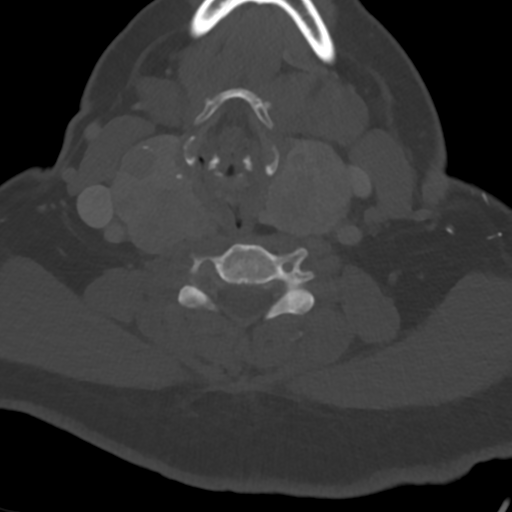
[im 64/106  bone]
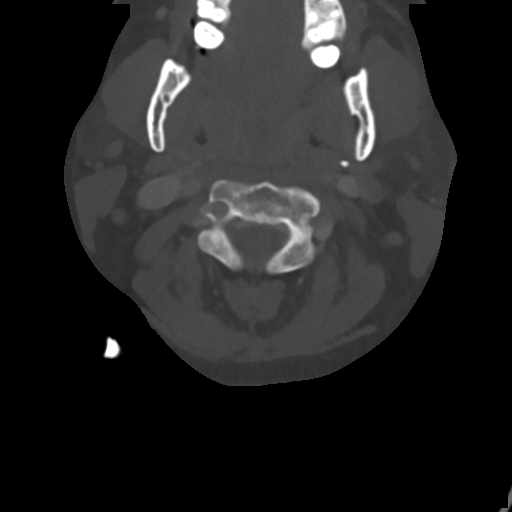
[im 85/106  bone]
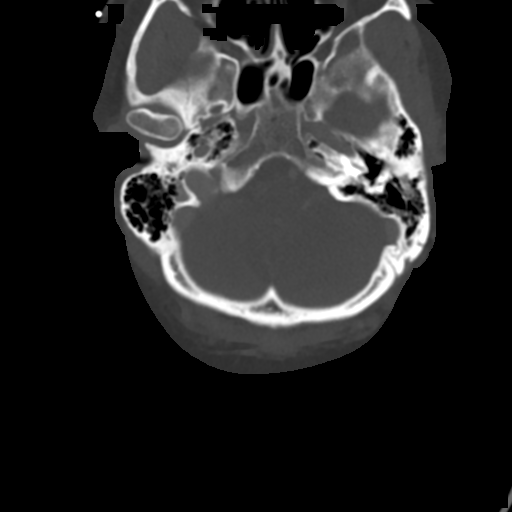

[Series 6: neck 2.0 st · sagittal · 0.41mm/px · 5 of 101 slices shown, 6 images (2 of 3)]
[im 34/101  bone]
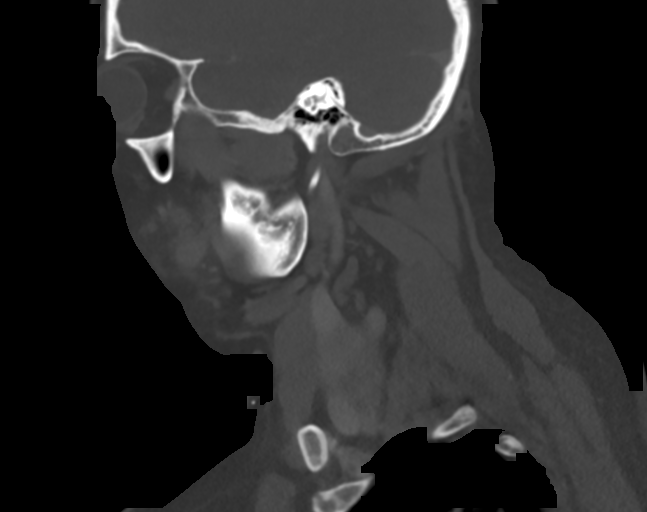
[im 42/101  bone]
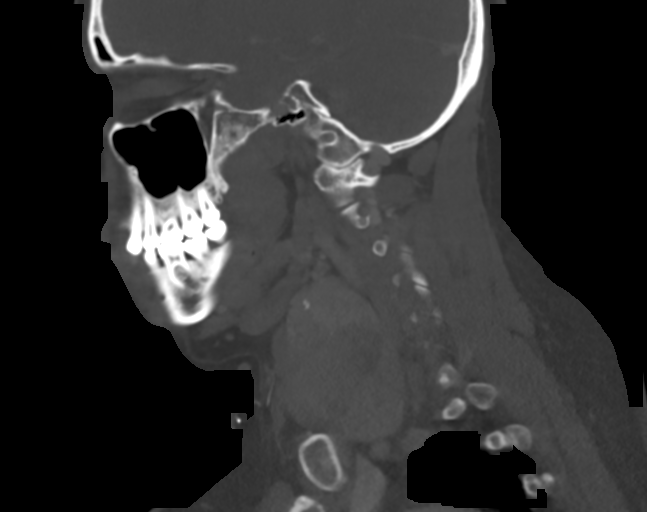
[im 51/101  soft-tissue]
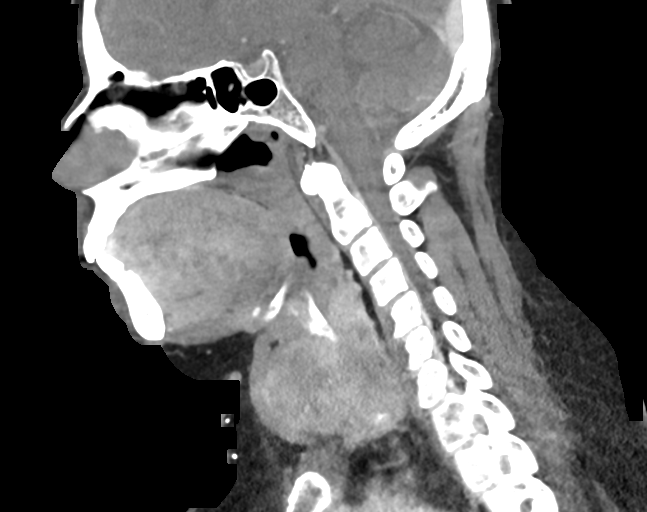
[im 51/101  bone]
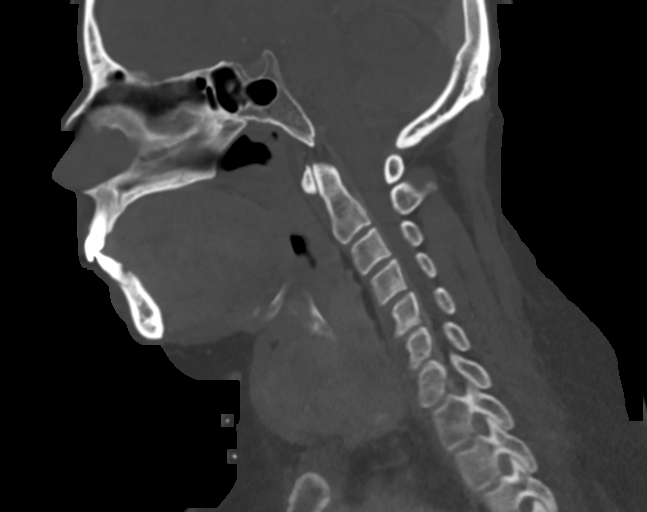
[im 59/101  bone]
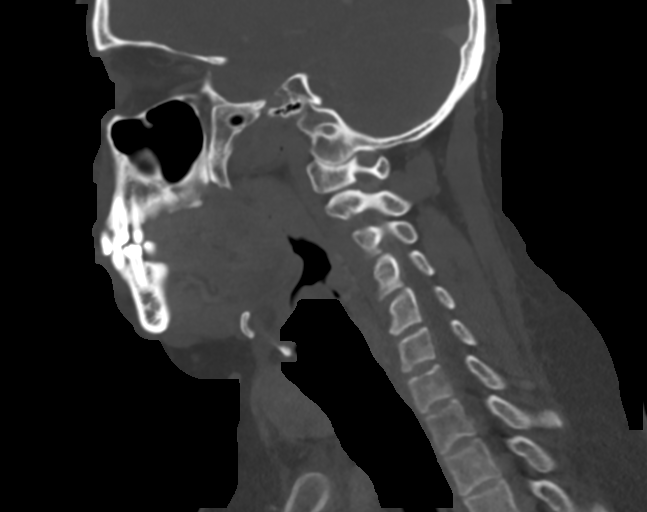
[im 67/101  bone]
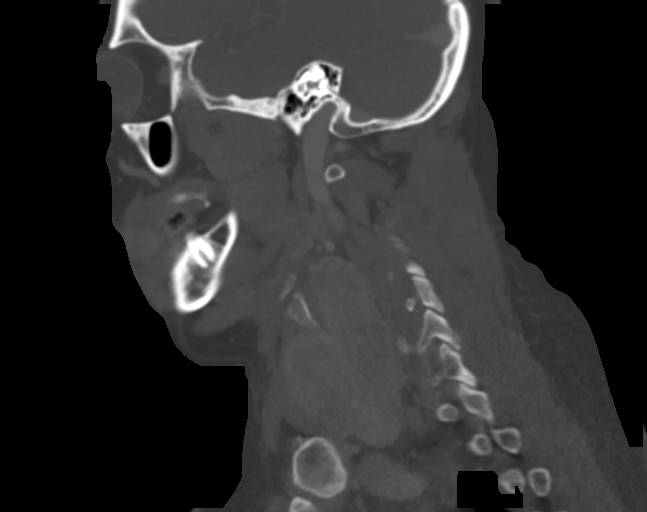

[Series 7: neck 2.0 st · coronal · 0.37mm/px · 3 of 120 slices shown (3 of 3)]
[im 24/120  bone]
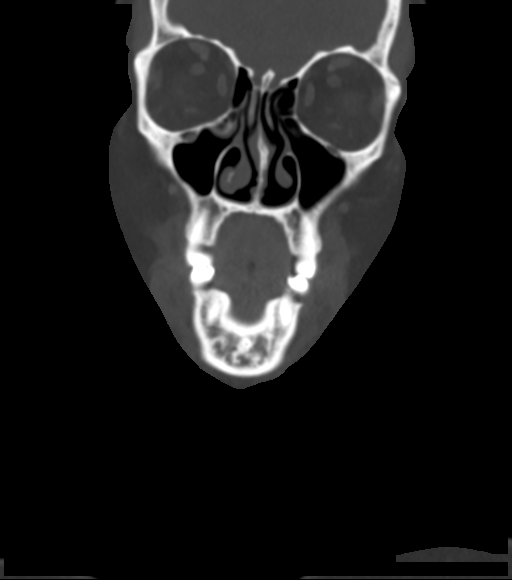
[im 48/120  bone]
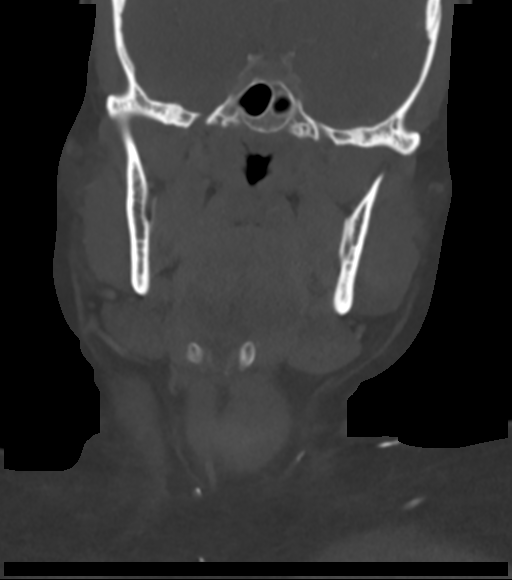
[im 72/120  bone]
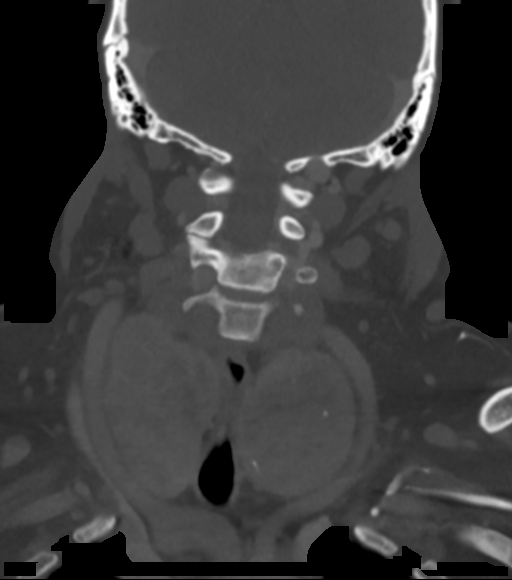

[Series 8: neck 2.0 st orthogonal · axial · 0.39mm/px · z∈[-233,-149]mm · 3 of 105 slices shown]
[im 21/105  bone]
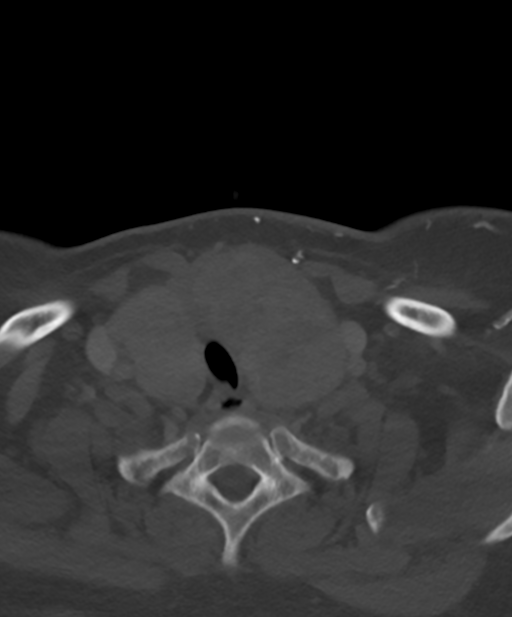
[im 42/105  bone]
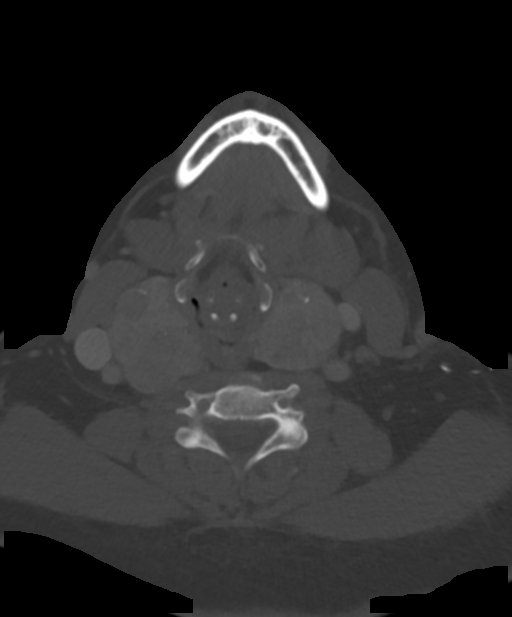
[im 63/105  bone]
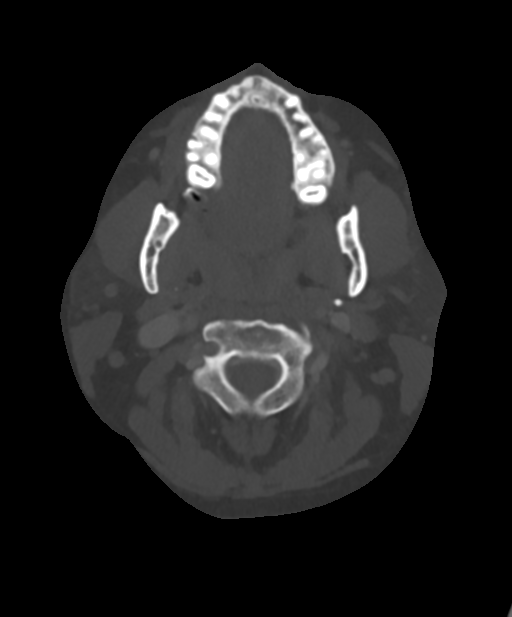

[15 of 33 positions shown; findings below may reference images not displayed]

FINDINGS: Pharynx and larynx: Normal. No mass or swelling.

Salivary glands: No inflammation, mass, or stone.

Thyroid: Massive thyromegaly, with areas of nodular enhancement and
calcification consistent with goiter. Mass effect on the airway
LEFT-to-RIGHT results in subglottic narrowing. There is no
significant substernal extension of the goiter. RIGHT lobe
measurements are approximately 5 x 4 x 8 cm. LEFT lobe measurements
are approximately 4 x 4 x 7 cm.

Lymph nodes: None enlarged or abnormal density.

Vascular: Marked lateral displacement of the carotid arteries and
jugular veins.

Limited intracranial: Negative.

Visualized orbits: Negative.

Mastoids and visualized paranasal sinuses: Clear.

Skeleton: No acute or aggressive process.

Upper chest: RIGHT apical scarring, and bronchiectatic change with
apical pleural thickening. No pneumothorax.

Other: None.
IMPRESSION: Massive goiter. Significant LEFT-to-RIGHT mass effect on the
subglottic airway.

## 2018-10-02 ENCOUNTER — Other Ambulatory Visit: Payer: Self-pay

## 2018-10-02 ENCOUNTER — Encounter: Payer: BC Managed Care – PPO | Attending: Primary Care | Admitting: Registered"

## 2018-10-02 ENCOUNTER — Encounter: Payer: Self-pay | Admitting: Registered"

## 2018-10-02 DIAGNOSIS — E119 Type 2 diabetes mellitus without complications: Secondary | ICD-10-CM

## 2018-10-02 NOTE — Patient Instructions (Signed)
-   Aim to have 3 meals a day.   - Have carbohydrates/starches, protein, and non-starchy vegetables with lunch and dinner.   - Make half plate non-starchy vegetables. Non-starchy vegetables is any vegetable other than corn, peas, and potatoes.   - Check blood sugars 3-4 times a day: fasting and 2 hours after each meal.   - Look into finding a counselor/therapist.

## 2018-10-02 NOTE — Progress Notes (Signed)
Diabetes Self-Management Education  Visit Type:  Follow-up  Appt. Start Time: 3:10 Appt. End Time: 4:15  10/03/2018  Melissa Valdez, identified by name and date of birth, is a 53 y.o. female with a diagnosis of Diabetes: Type 2.   ASSESSMENT   Pt states she eats when she feeling depressed. States she needs to get back on track, having a hard time going to sleep, has had headache for the last 3 nights. Reports glucometer is broken and does not have blood pressure cuff to check blood pressure at home. States she has not been able to check blood sugar numbers in about 6 months. Glucometer was provided:  [employer] Next Lot: [id number] Exp: 01/14/2019  Pt states she has fibroids and needs to lose weight. Reports she has been struggling with weight since 12. Reports she has not had menstrual cycle since 04/23-04/28. States her main concern is getting weight under control and wants to be a better version of herself. States her self-esteem is not good because of her size. Pt believes that if she is a lesser weight she will be  more active and less sluggish.    There were no vitals taken for this visit. There is no height or weight on file to calculate BMI.   Diabetes Self-Management Education - 10/02/18 1525      Health Coping   How would you rate your overall health?  Fair      Psychosocial Assessment   Patient Belief/Attitude about Diabetes  Afraid    Self-care barriers  None    Self-management support  Friends;Family    Patient Concerns  Weight Control    Special Needs  None    Preferred Learning Style  No preference indicated    Learning Readiness  Ready      Complications   Last HgB A1C per patient/outside source  9.3 %    How often do you check your blood sugar?  0 times/day (not testing)    Number of hypoglycemic episodes per month  0    Number of hyperglycemic episodes per week  1    Can you tell when your blood sugar is high?  Yes    What do you do if your blood sugar is  high?  nothing    Have you had a dilated eye exam in the past 12 months?  Yes    Have you had a dental exam in the past 12 months?  No    Are you checking your feet?  Yes    How many days per week are you checking your feet?  7      Dietary Intake   Breakfast  [place] sandwich + mayo    Lunch  grilled chicken salad + ranch dressing + whole milk    Snack (afternoon)  bag of chips + ginger ale    Dinner  none    Beverage(s)  water (64+ oz), whole milk, ginger ale      Exercise   Exercise Type  Light (walking / raking leaves)    How many days per week to you exercise?  4    How many minutes per day do you exercise?  20    Total minutes per week of exercise  80      Patient Education   Previous Diabetes Education  Yes (please comment)   states she was educated a few years ago   Disease state   Definition of diabetes, type 1 and 2, and the  diagnosis of diabetes    Nutrition management   Role of diet in the treatment of diabetes and the relationship between the three main macronutrients and blood glucose level;Reviewed blood glucose goals for pre and post meals and how to evaluate the patients' food intake on their blood glucose level.;Information on hints to eating out and maintain blood glucose control.;Meal options for control of blood glucose level and chronic complications.    Monitoring  Purpose and frequency of SMBG.;Taught/discussed recording of test results and interpretation of SMBG.    Acute complications  Taught treatment of hypoglycemia - the 15 rule.;Discussed and identified patients' treatment of hyperglycemia.    Personal strategies to promote health  Other (comment)   importance of mental health; seek counselor/therapist     Individualized Goals (developed by patient)   Nutrition  Follow meal plan discussed;General guidelines for healthy choices and portions discussed    Medications  take my medication as prescribed    Monitoring   test my blood glucose as discussed;test  blood glucose pre and post meals as discussed    Reducing Risk  examine blood glucose patterns;treat hypoglycemia with 15 grams of carbs if blood glucose less than 70mg /dL;increase portions of nuts and seeds;increase portions of olive oil in diet;increase portions of healthy fats    Health Coping  Other (comment)   importance of mental health; seek counselor/therapist     Post-Education Assessment   Patient understands the diabetes disease and treatment process.  Demonstrates understanding / competency    Patient understands incorporating nutritional management into lifestyle.  Demonstrates understanding / competency    Patient undertands incorporating physical activity into lifestyle.  Needs Review    Patient understands using medications safely.  Needs Review    Patient understands monitoring blood glucose, interpreting and using results  Demonstrates understanding / competency    Patient understands prevention, detection, and treatment of acute complications.  Demonstrates understanding / competency    Patient understands prevention, detection, and treatment of chronic complications.  Needs Review    Patient understands how to develop strategies to address psychosocial issues.  Needs Review    Patient understands how to develop strategies to promote health/change behavior.  Needs Review      Outcomes   Program Status  Not Completed      Subsequent Visit   Since your last visit have you continued or begun to take your medications as prescribed?  Yes    Since your last visit have you had your blood pressure checked?  No    Since your last visit have you experienced any weight changes?  No change    Since your last visit, are you checking your blood glucose at least once a day?  No       Learning Objective:  Patient will have a greater understanding of diabetes self-management. Patient education plan is to attend individual and/or group sessions per assessed needs and concerns.   Plan:    Patient Instructions  - Aim to have 3 meals a day.   - Have carbohydrates/starches, protein, and non-starchy vegetables with lunch and dinner.   - Make half plate non-starchy vegetables. Non-starchy vegetables is any vegetable other than corn, peas, and potatoes.   - Check blood sugars 3-4 times a day: fasting and 2 hours after each meal.   - Look into finding a counselor/therapist.      Expected Outcomes:  Demonstrated interest in learning. Expect positive outcomes  Education material provided: ADA - How to Thrive: A  Guide for Your Journey with Diabetes  If problems or questions, patient to contact team via:  Phone and Email  Future DSME appointment: - 4-6 wks

## 2018-10-10 ENCOUNTER — Encounter: Payer: Self-pay | Admitting: Pharmacist

## 2018-10-10 ENCOUNTER — Encounter: Payer: Self-pay | Admitting: Orthopedic Surgery

## 2018-10-10 ENCOUNTER — Ambulatory Visit (INDEPENDENT_AMBULATORY_CARE_PROVIDER_SITE_OTHER): Payer: BC Managed Care – PPO | Admitting: Orthopedic Surgery

## 2018-10-10 ENCOUNTER — Other Ambulatory Visit: Payer: Self-pay

## 2018-10-10 ENCOUNTER — Ambulatory Visit: Payer: BC Managed Care – PPO | Attending: Family Medicine | Admitting: Pharmacist

## 2018-10-10 DIAGNOSIS — E08649 Diabetes mellitus due to underlying condition with hypoglycemia without coma: Secondary | ICD-10-CM

## 2018-10-10 DIAGNOSIS — E1165 Type 2 diabetes mellitus with hyperglycemia: Secondary | ICD-10-CM | POA: Diagnosis not present

## 2018-10-10 DIAGNOSIS — M1712 Unilateral primary osteoarthritis, left knee: Secondary | ICD-10-CM

## 2018-10-10 DIAGNOSIS — M1711 Unilateral primary osteoarthritis, right knee: Secondary | ICD-10-CM

## 2018-10-10 LAB — GLUCOSE, POCT (MANUAL RESULT ENTRY): POC Glucose: 299 mg/dl — AB (ref 70–99)

## 2018-10-10 MED ORDER — METHYLPREDNISOLONE ACETATE 40 MG/ML IJ SUSP
40.0000 mg | INTRAMUSCULAR | Status: AC | PRN
  Administered 2018-10-10: 40 mg via INTRA_ARTICULAR

## 2018-10-10 MED ORDER — BUPIVACAINE HCL 0.25 % IJ SOLN
4.0000 mL | INTRAMUSCULAR | Status: AC | PRN
  Administered 2018-10-10: 4 mL via INTRA_ARTICULAR

## 2018-10-10 MED ORDER — IBUPROFEN 800 MG PO TABS: ORAL_TABLET | ORAL | 3 refills | Status: DC

## 2018-10-10 MED ORDER — JANUMET XR 50-1000 MG PO TB24: 2.0000 | ORAL_TABLET | Freq: Every day | ORAL | 2 refills | Status: DC

## 2018-10-10 MED ORDER — LIDOCAINE HCL 1 % IJ SOLN
5.0000 mL | INTRAMUSCULAR | Status: AC | PRN
  Administered 2018-10-10: 5 mL

## 2018-10-10 NOTE — Progress Notes (Signed)
Office Visit Note   Patient: Melissa Valdez           Date of Birth: 1965-08-15           MRN: [id number] Visit Date: 10/10/2018 Requested by: Clent Demark, PA-C No address on file PCP: Clent Demark, PA-C  Subjective: Chief Complaint  Patient presents with  . Right Knee - Follow-up    HPI: Melissa Valdez is a 53 year old patient with known right knee arthritis.  She also describes some left knee pain which is not as severe as the right knee.  She is had gel injections and they did not help very much.  Cortisone definitely helped her more.  Her knee just started bothering her last week.  Ibuprofen does help her knees.  She wants to do a little bit of exercise but I have advised her against walking for exercise and have recommended non-loadbearing activities.              ROS: All systems reviewed are negative as they relate to the chief complaint within the history of present illness.  Patient denies  fevers or chills.   Assessment & Plan: Visit Diagnoses:  1. Unilateral primary osteoarthritis, right knee   2. Unilateral primary osteoarthritis, left knee     Plan: Impression is right knee and left knee arthritis with the right knee being the worst.  Plan is to refill ibuprofen and cortisone injections into both knees today.  She is diabetic and I do want her to be careful of her glucose management over the next 2 to 3 days.  She is seeing her primary care physician today who will be able to advise her on that as well.  I will see her back as needed.  The pain she describes in the right knee is primarily anterior and medial.  We could consider when the time comes partial knee replacement in her case but we would really need to make sure she is not having global pain as opposed to very focal anterior medial pain.  Follow-Up Instructions: Return if symptoms worsen or fail to improve.   Orders:  No orders of the defined types were placed in this encounter.  Meds ordered this  encounter  Medications  . ibuprofen (ADVIL) 800 MG tablet    Sig: 1 po bid prn pain    Dispense:  60 tablet    Refill:  3      Procedures: Large Joint Inj: bilateral knee on 10/10/2018 7:27 PM Indications: diagnostic evaluation, joint swelling and pain Details: 18 G 1.5 in needle, superolateral approach  Arthrogram: No  Medications (Right): 5 mL lidocaine 1 %; 4 mL bupivacaine 0.25 %; 40 mg methylPREDNISolone acetate 40 MG/ML Medications (Left): 5 mL lidocaine 1 %; 4 mL bupivacaine 0.25 %; 40 mg methylPREDNISolone acetate 40 MG/ML Outcome: tolerated well, no immediate complications Procedure, treatment alternatives, risks and benefits explained, specific risks discussed. Consent was given by the patient. Immediately prior to procedure a time out was called to verify the correct patient, procedure, equipment, support staff and site/side marked as required. Patient was prepped and draped in the usual sterile fashion.       Clinical Data: No additional findings.  Objective: Vital Signs: There were no vitals taken for this visit.  Physical Exam:   Constitutional: Patient appears well-developed HEENT:  Head: Normocephalic Eyes:EOM are normal Neck: Normal range of motion Cardiovascular: Normal rate Pulmonary/chest: Effort normal Neurologic: Patient is alert Skin: Skin is warm Psychiatric: Patient has  normal mood and affect    Ortho Exam: Ortho exam demonstrates trace effusion that left knee.  No effusion in the right knee.  She has about a 5 degree flexion contracture.  Extensor mechanism is intact.  Collateral and cruciate ligaments are stable.  Pedal pulses palpable.  No other masses lymphadenopathy or skin changes noted in that knee region.  Specialty Comments:  No specialty comments available.  Imaging: No results found.   PMFS History: Patient Active Problem List   Diagnosis Date Noted  . Menopausal syndrome (hot flashes) 05/22/2018  . Chronic pain of both  knees 05/22/2018  . Goiter 05/09/2017  . Diabetes mellitus type II, controlled, with no complications ([place]) 04/08/8249  . Hypertension 10/28/2014  . Morbid obesity with BMI of 40.0-44.9, adult ([place]) 06/28/2014  . Dysmenorrhea 01/20/2014  . Abnormal uterine bleeding (AUB) 01/20/2014  . Fibroids, intramural 01/19/2014   Past Medical History:  Diagnosis Date  . Diabetes mellitus without complication ([place])   . GERD (gastroesophageal reflux disease)   . Hypertension   . Obesity, Class III, BMI 40-49.9 (morbid obesity) ([place])   . Sleep apnea    cpap-     Family History  Problem Relation Age of Onset  . Hypertension Mother   . Diabetes Father   . Goiter Sister   . Stroke Other     Past Surgical History:  Procedure Laterality Date  . boil on leg removed   1980s  . TUBAL LIGATION  1999   Social History   Occupational History  . Not on file  Tobacco Use  . Smoking status: Current Every Day Smoker    Packs/day: 0.25    Types: Cigarettes  . Smokeless tobacco: Never Used  Substance and Sexual Activity  . Alcohol use: Yes    Alcohol/week: 3.0 standard drinks    Types: 2 Cans of beer, 1 Shots of liquor per week  . Drug use: No  . Sexual activity: Yes    Partners: Male    Birth control/protection: Surgical    Comment: Last Encnounter 4 days ago

## 2018-10-10 NOTE — Patient Instructions (Signed)
Thank you for coming to see me today. Please do the following:  1. Stop metformin. 2. Start Janumet 50-1000 mg daily after supper.  3. Continue checking blood sugars at home.  4. Continue making the lifestyle changes we've discussed together during our visit. Diet and exercise play a significant role in improving your blood sugars.  Follow-up with me in 3 weeks.   Hypoglycemia or low blood sugar:   Low blood sugar can happen quickly and may become an emergency if not treated right away.   While this shouldn't happen often, it can be brought upon if you skip a meal or do not eat enough. Also, if your insulin or other diabetes medications are dosed too high, this can cause your blood sugar to go to low.   Warning signs of low blood sugar include: 1. Feeling shaky or dizzy 2. Feeling weak or tired  3. Excessive hunger 4. Feeling anxious or upset  5. Sweating even when you aren't exercising  What to do if I experience low blood sugar? 1. Check your blood sugar with your meter. If lower than 70, proceed to step 2.  2. Treat with 3-4 glucose tablets or 3 packets of regular sugar. If these aren't around, you can try hard candy. Yet another option would be to drink 4 ounces of fruit juice or 6 ounces of REGULAR soda.  3. Re-check your sugar in 15 minutes. If it is still below 70, do what you did in step 2 again. If has come back up, go ahead and eat a snack or small meal at this time.

## 2018-10-10 NOTE — Progress Notes (Signed)
S:    PCP. Melissa Valdez   No chief complaint on file.  Patient arrives in good spirits. Presents for diabetes management at the request of [name]. Patient was referred on 09/03/18.  Patient was last seen by Primary Care Provider on 08/25/18. Of note, A1c on 08/28/18 up at 9.3 from 7.6.   Patient reports diabetes was diagnosed in 2-3 yrs ago..   Family/Social History:  - FHx: HTN (mother), DM (father) - Alcohol: 3 standard drinks/week - Tobacco: current 0.25 PPD smoker  Insurance coverage/medication affordability: BCBS  Patient reports adherence with medications.  Current diabetes medications include: glimepiride 2 mg daily before breakfast; metformin 1000 mg BID Current hypertension medications include: amlodipine 10 mg daily, HCTZ 25 mg daily Current hyperlipidemia medications include: simvastatin 20 mg daily  Patient denies hypoglycemic events.  Patient reported dietary habits:  - Reports that she limits carbs  - Does admit to frequent consumption of soda, sweet tea, and juice  Patient-reported exercise habits:  - Limited d/t knee pain   Patient reports polyuria, polydipsia.  Patient denies neuropathy. Patient denies visual changes. Patient reports self foot exams.    O:  POCT glucose: 299 (reports rec steroid shots at her appt this AM)  Home CBGs: stay in the 200s  Lab Results  Component Value Date   HGBA1C 9.3 (H) 08/28/2018   There were no vitals filed for this visit.  Lipid Panel     Component Value Date/Time   CHOL 174 08/28/2018 0930   TRIG 88 08/28/2018 0930   HDL 68 08/28/2018 0930   CHOLHDL 2.6 08/28/2018 0930   LDLCALC 88 08/28/2018 0930   Clinical ASCVD: No  The 10-year ASCVD risk score [name], et al., 2013) is: 22%   Values used to calculate the score:     Age: 16 years     Sex: Female     Is Non-Hispanic African American: Yes     Diabetic: Yes     Tobacco smoker: Yes     Systolic Blood Pressure: 767 mmHg     Is BP treated:  Yes     HDL Cholesterol: 68 mg/dL     Total Cholesterol: 174 mg/dL   A/P: Diabetes longstanding currently uncontrolled. Patient is able to verbalize appropriate hypoglycemia management plan. Patient is adherent with medication. Control is suboptimal due to dietary indiscretion and physical inactivity.   Pt is adamant that she does not want to start insulin or an injectable. She reports developing a lump in her throat after Trulicity in the past. She is to see a physician regarding this in the future. Could try an SGLT-2 inhibitor but pt wishes to try Janumet first.    -Discontinued metformin -Start Janumet 50-1000 mg XR BID -Extensively discussed pathophysiology of DM, recommended lifestyle interventions, dietary effects on glycemic control -Counseled on s/sx of and management of hypoglycemia -Next A1C anticipated 11/2018.   ASCVD risk - primary prevention in patient with DM. Last LDL < 100.  ASCVD risk score is >20%  - high intensity statin indicated. Recommend to stop simvastatin and start atorvastatin 40 mg daily. Will address at next appt.   HM: vaccines UTD.   Written patient instructions provided.  Total time in face to face counseling 15 minutes.   Follow up Pharmacist Clinic Visit in 3 weeks.     Patient seen with:  Jonathon Bellows PharmD Candidate [phone] [place], PharmD, [place]  [place] [phone]

## 2018-10-23 ENCOUNTER — Ambulatory Visit: Payer: BC Managed Care – PPO | Admitting: Registered"

## 2018-10-31 ENCOUNTER — Encounter: Payer: Self-pay | Admitting: Pharmacist

## 2018-10-31 ENCOUNTER — Other Ambulatory Visit: Payer: Self-pay

## 2018-10-31 ENCOUNTER — Ambulatory Visit: Payer: BC Managed Care – PPO | Attending: Family Medicine | Admitting: Pharmacist

## 2018-10-31 DIAGNOSIS — E0865 Diabetes mellitus due to underlying condition with hyperglycemia: Secondary | ICD-10-CM | POA: Diagnosis not present

## 2018-10-31 DIAGNOSIS — E1165 Type 2 diabetes mellitus with hyperglycemia: Secondary | ICD-10-CM | POA: Diagnosis not present

## 2018-10-31 DIAGNOSIS — E08649 Diabetes mellitus due to underlying condition with hypoglycemia without coma: Secondary | ICD-10-CM

## 2018-10-31 LAB — GLUCOSE, POCT (MANUAL RESULT ENTRY): POC Glucose: 236 mg/dl — AB (ref 70–99)

## 2018-10-31 MED ORDER — ATORVASTATIN CALCIUM 40 MG PO TABS: 40.0000 mg | ORAL_TABLET | Freq: Every day | ORAL | 3 refills | Status: AC

## 2018-10-31 NOTE — Patient Instructions (Signed)
Thank you for coming to see me today. Please do the following:  1. Continue to take two tablets of Janumet daily.  2. Take 1 glimepiride tablet before breakfast.  3. Continue checking blood sugars at home.  4. Continue making the lifestyle changes we've discussed together during our visit. Diet and exercise play a significant role in improving your blood sugars.  Follow-up with me next Friday.   Hypoglycemia or low blood sugar:   Low blood sugar can happen quickly and may become an emergency if not treated right away.   While this shouldn't happen often, it can be brought upon if you skip a meal or do not eat enough. Also, if your insulin or other diabetes medications are dosed too high, this can cause your blood sugar to go to low.   Warning signs of low blood sugar include: 1. Feeling shaky or dizzy 2. Feeling weak or tired  3. Excessive hunger 4. Feeling anxious or upset  5. Sweating even when you aren't exercising  What to do if I experience low blood sugar? 1. Check your blood sugar with your meter. If lower than 70, proceed to step 2.  2. Treat with 3-4 glucose tablets or 3 packets of regular sugar. If these aren't around, you can try hard candy. Yet another option would be to drink 4 ounces of fruit juice or 6 ounces of REGULAR soda.  3. Re-check your sugar in 15 minutes. If it is still below 70, do what you did in step 2 again. If has come back up, go ahead and eat a snack or small meal at this time.

## 2018-10-31 NOTE — Progress Notes (Signed)
    S:    PCP. Juluis Mire   No chief complaint on file.  Patient arrives in good spirits. Presents for diabetes management at the request of [name]. Patient was referred on 09/03/18.  Patient was last seen by Primary Care Provider on 08/25/18. I saw her last month and started Janumet.   Patient reports diabetes was diagnosed in 2-3 yrs ago..   Family/Social History:  - FHx: HTN (mother), DM (father) - Alcohol: 3 standard drinks/week - Tobacco: current 0.25 PPD smoker  Insurance coverage/medication affordability: BCBS  Patient reports adherence with medications but taking differently than prescribed.  Current diabetes medications include: glimepiride 2 mg daily before breakfast; Janumet 50-1000 mg; 2 tablets daily (only taking 1 tablet daily) Current hypertension medications include: amlodipine 10 mg daily, HCTZ 25 mg daily Current hyperlipidemia medications include: simvastatin 20 mg daily  Patient denies hypoglycemic events.  Patient reported dietary habits:  - Reports that she limits carbs  - Switched to Zero sodas and has eliminated juices from her diet   Patient-reported exercise habits:  - Limited d/t knee pain   Patient reports improvement in polyuria, polydipsia.  Patient denies neuropathy. Patient denies visual changes. Patient reports self foot exams.    O:  POCT glucose: 236 (jello shots last night)  Home fasting CBGs: 150s - 160s   Lab Results  Component Value Date   HGBA1C 9.3 (H) 08/28/2018   There were no vitals filed for this visit.  Lipid Panel     Component Value Date/Time   CHOL 174 08/28/2018 0930   TRIG 88 08/28/2018 0930   HDL 68 08/28/2018 0930   CHOLHDL 2.6 08/28/2018 0930   LDLCALC 88 08/28/2018 0930   Clinical ASCVD: No  The 10-year ASCVD risk score [name], et al., 2013) is: 22%   Values used to calculate the score:     Age: 53 years     Sex: Female     Is Non-Hispanic African American: Yes     Diabetic: Yes  Tobacco smoker: Yes     Systolic Blood Pressure: 027 mmHg     Is BP treated: Yes     HDL Cholesterol: 68 mg/dL     Total Cholesterol: 174 mg/dL   A/P: Diabetes longstanding currently uncontrolled, but reported home sugars have improved with Janumet. Patient is able to verbalize appropriate hypoglycemia management plan. Patient is adherent with medication but only takes 1 tablet of Janumet instead of 2. Will have her increase to two tablets after supper.   -Continue Janumet 50-1000 mg; two tablets after supper -Continue glimepiride 2 mg before breakfast -Extensively discussed pathophysiology of DM, recommended lifestyle interventions, dietary effects on glycemic control -Counseled on s/sx of and management of hypoglycemia -Next A1C anticipated 11/2018.   ASCVD risk - primary prevention in patient with DM. Last LDL < 100.  ASCVD risk score is >20%  - high intensity statin indicated. Recommend to stop simvastatin and start atorvastatin 40 mg daily. Pt is amenable. - Stop simvastatin  - Start atorvastatin 40 mg daily - Recommend LFTs in 4-6 weeks   HM: vaccines UTD.   Written patient instructions provided.  Total time in face to face counseling 15 minutes.   Follow up Pharmacist Clinic Visit in 1 weeks.     [name], PharmD, [place] [phone]

## 2018-11-07 ENCOUNTER — Ambulatory Visit: Payer: BC Managed Care – PPO | Admitting: Pharmacist

## 2018-11-14 ENCOUNTER — Ambulatory Visit: Payer: BC Managed Care – PPO | Admitting: Pharmacist

## 2018-11-21 ENCOUNTER — Ambulatory Visit: Payer: BC Managed Care – PPO | Attending: Family Medicine | Admitting: Pharmacist

## 2018-11-21 ENCOUNTER — Other Ambulatory Visit: Payer: Self-pay

## 2018-11-21 ENCOUNTER — Encounter: Payer: Self-pay | Admitting: Pharmacist

## 2018-11-21 DIAGNOSIS — E0865 Diabetes mellitus due to underlying condition with hyperglycemia: Secondary | ICD-10-CM | POA: Diagnosis not present

## 2018-11-21 DIAGNOSIS — E1165 Type 2 diabetes mellitus with hyperglycemia: Secondary | ICD-10-CM | POA: Diagnosis not present

## 2018-11-21 DIAGNOSIS — E119 Type 2 diabetes mellitus without complications: Secondary | ICD-10-CM

## 2018-11-21 DIAGNOSIS — E08649 Diabetes mellitus due to underlying condition with hypoglycemia without coma: Secondary | ICD-10-CM

## 2018-11-21 LAB — POCT GLYCOSYLATED HEMOGLOBIN (HGB A1C): Hemoglobin A1C: 10.2 % — AB (ref 4.0–5.6)

## 2018-11-21 LAB — GLUCOSE, POCT (MANUAL RESULT ENTRY): POC Glucose: 190 mg/dl — AB (ref 70–99)

## 2018-11-21 MED ORDER — METFORMIN HCL 1000 MG PO TABS: 1000.0000 mg | ORAL_TABLET | Freq: Two times a day (BID) | ORAL | 0 refills | Status: DC

## 2018-11-21 MED ORDER — TRULICITY 0.75 MG/0.5ML ~~LOC~~ SOAJ: 0.7500 mg | SUBCUTANEOUS | 2 refills | Status: AC

## 2018-11-21 MED ORDER — ACCU-CHEK AVIVA PLUS VI STRP: ORAL_STRIP | 9 refills | Status: AC

## 2018-11-21 MED ORDER — METFORMIN HCL 1000 MG PO TABS: 1000.0000 mg | ORAL_TABLET | Freq: Two times a day (BID) | ORAL | 0 refills | Status: AC

## 2018-11-21 NOTE — Progress Notes (Signed)
    S:    PCP. Melissa Valdez   No chief complaint on file.  Patient arrives in good spirits. Presents for diabetes management at the request of Melissa Valdez. Patient was referred on 09/03/18.  Patient was last seen by Primary Care Provider on 08/25/18. I last saw her 10/31/18 and pt reported taking Janumet differently than prescribed.   Patient reports diabetes was diagnosed in 2-3 yrs ago.  Family/Social History:  - FHx: HTN (mother), DM (father) - Alcohol: 3 standard drinks/week - Tobacco: current 0.25 PPD smoker  Insurance coverage/medication affordability: BCBS  Patient reports  adherence with medications. Current diabetes medications include: glimepiride 2 mg daily before breakfast; Janumet 50-1000 mg; 2 tablets daily  Current hypertension medications include: amlodipine 10 mg daily, HCTZ 25 mg daily Current hyperlipidemia medications include: atorvastatin 40 mg daily  Patient denies hypoglycemic events.  Patient reported dietary habits:  - Reports that she limits carbs  - Switched to Zero sodas and has eliminated juices from her diet   Patient-reported exercise habits:  - Limited d/t knee pain   Patient reports improvement in polyuria, polydipsia.  Patient denies neuropathy. Patient denies visual changes. Patient reports self foot exams.    O:  POCT glucose: 190  Home fasting CBGs: 140 - 280s  Lab Results  Component Value Date   HGBA1C 10.2 (A) 11/21/2018   There were no vitals filed for this visit.  Lipid Panel     Component Value Date/Time   CHOL 174 08/28/2018 0930   TRIG 88 08/28/2018 0930   HDL 68 08/28/2018 0930   CHOLHDL 2.6 08/28/2018 0930   LDLCALC 88 08/28/2018 0930   Clinical ASCVD: No  The 10-year ASCVD risk score Melissa Valdez) is: 22%   Values used to calculate the score:     Age: 53 years     Sex: Female     Is Non-Hispanic African American: Yes     Diabetic: Yes     Tobacco smoker: Yes     Systolic Blood Pressure: 409  mmHg     Is BP treated: Yes     HDL Cholesterol: 68 mg/dL     Total Cholesterol: 174 mg/dL   A/P: Diabetes longstanding currently uncontrolled. Patient is able to verbalize appropriate hypoglycemia management plan. Patient is adherent with medication. Her A1c today is up from 3 months previous and her home sugars are not controlled. Will start Trulicity - pt reports success with this agent in the past but was unable to obtain d/t insurance. Will have her fill at our pharmacy.   -Stop Janumet 50-1000 mg -Start metformin 1000 mg BID -Start Trulicity 8.11 mg weekly -Continue glimepiride 2 mg before breakfast -Extensively discussed pathophysiology of DM, recommended lifestyle interventions, dietary effects on glycemic control -Counseled on s/sx of and management of hypoglycemia -Next A1C anticipated 11/20  ASCVD risk - primary prevention in patient with DM. Last LDL < 100.  ASCVD risk score is >20%  - high intensity statin indicated.  - Continue atorvastatin 40 mg daily  HM: vaccines UTD.   Written patient instructions provided.  Total time in face to face counseling 15 minutes.   Follow up w/ PCP.  [name], PharmD, [place] [phone]

## 2018-11-21 NOTE — Patient Instructions (Signed)
Thank you for coming to see me today. Please do the following:  1. Stop Janumet.  2. Start metformin 1000 mg twice a day. Take in the morning after breakfast and in the evening after supper. 3. Start Trulicity. Take 1 shot weekly.  4. Continue glimepiride for now.  5. Continue checking blood sugars at home.  6. Continue making the lifestyle changes we've discussed together during our visit. Diet and exercise play a significant role in improving your blood sugars.  7. Follow-up with with [name].   Hypoglycemia or low blood sugar:   Low blood sugar can happen quickly and may become an emergency if not treated right away.   While this shouldn't happen often, it can be brought upon if you skip a meal or do not eat enough. Also, if your insulin or other diabetes medications are dosed too high, this can cause your blood sugar to go to low.   Warning signs of low blood sugar include: 1. Feeling shaky or dizzy 2. Feeling weak or tired  3. Excessive hunger 4. Feeling anxious or upset  5. Sweating even when you aren't exercising  What to do if I experience low blood sugar? 1. Check your blood sugar with your meter. If lower than 70, proceed to step 2.  2. Treat with 3-4 glucose tablets or 3 packets of regular sugar. If these aren't around, you can try hard candy. Yet another option would be to drink 4 ounces of fruit juice or 6 ounces of REGULAR soda.  3. Re-check your sugar in 15 minutes. If it is still below 70, do what you did in step 2 again. If has come back up, go ahead and eat a snack or small meal at this time.

## 2018-12-03 ENCOUNTER — Other Ambulatory Visit: Payer: Self-pay

## 2018-12-03 ENCOUNTER — Encounter: Payer: Self-pay | Admitting: Orthopedic Surgery

## 2018-12-03 ENCOUNTER — Ambulatory Visit (HOSPITAL_COMMUNITY)
Admission: EM | Admit: 2018-12-03 | Discharge: 2018-12-03 | Disposition: A | Discharge status: Home / Self Care | Payer: BC Managed Care – PPO | Attending: Family Medicine | Admitting: Family Medicine

## 2018-12-03 ENCOUNTER — Other Ambulatory Visit (INDEPENDENT_AMBULATORY_CARE_PROVIDER_SITE_OTHER): Payer: Self-pay | Admitting: Critical Care Medicine

## 2018-12-03 ENCOUNTER — Ambulatory Visit: Payer: BC Managed Care – PPO | Admitting: Orthopedic Surgery

## 2018-12-03 ENCOUNTER — Encounter (HOSPITAL_COMMUNITY): Payer: Self-pay | Admitting: Emergency Medicine

## 2018-12-03 DIAGNOSIS — I10 Essential (primary) hypertension: Secondary | ICD-10-CM

## 2018-12-03 DIAGNOSIS — H5712 Ocular pain, left eye: Secondary | ICD-10-CM

## 2018-12-03 MED ORDER — TETRACAINE HCL 0.5 % OP SOLN
OPHTHALMIC | Status: AC
  Filled 2018-12-03: qty 4

## 2018-12-03 MED ORDER — ARTIFICIAL TEARS OPHTHALMIC OINT: TOPICAL_OINTMENT | Freq: Every day | OPHTHALMIC | 1 refills | Status: AC | PRN

## 2018-12-03 NOTE — Discharge Instructions (Signed)
Placed a drop in the eye hear to numb You can use the Lacrilube for soothing and comfort.  Follow up as needed for continued or worsening symptoms

## 2018-12-03 NOTE — ED Triage Notes (Signed)
Pt sts left eye pain after getting some hair gel in eye last night; pt sts some watering of eye

## 2018-12-04 NOTE — ED Provider Notes (Signed)
Melissa Valdez    CSN: [id number] Arrival date & time: 12/03/18  1044      History   Chief Complaint Chief Complaint  Patient presents with  . Eye Pain    HPI Melissa Valdez is a 53 y.o. female.   Patient is a 53 year old female the presents today with left eye pain.  This started after getting some hair gel in the eye and the eye became very irritated.  Describes the pain as burning. Symptoms have been constant.  She has been using Pataday eyedrops without relief.  Denies any foreign bodies or injury to the eye.  Denies any trouble with vision.  ROS per HPI      Past Medical History:  Diagnosis Date  . Diabetes mellitus without complication ([place])   . GERD (gastroesophageal reflux disease)   . Hypertension   . Obesity, Class III, BMI 40-49.9 (morbid obesity) ([place])   . Sleep apnea    cpap-     Patient Active Problem List   Diagnosis Date Noted  . Menopausal syndrome (hot flashes) 05/22/2018  . Chronic pain of both knees 05/22/2018  . Goiter 05/09/2017  . Diabetes mellitus type II, controlled, with no complications ([place]) 27/78/2423  . Hypertension 10/28/2014  . Morbid obesity with BMI of 40.0-44.9, adult ([place]) 06/28/2014  . Dysmenorrhea 01/20/2014  . Abnormal uterine bleeding (AUB) 01/20/2014  . Fibroids, intramural 01/19/2014    Past Surgical History:  Procedure Laterality Date  . boil on leg removed   1980s  . TUBAL LIGATION  1999    OB History    Gravida  2   Para  2   Term  2   Preterm      AB      Living  2     SAB      TAB      Ectopic      Multiple      Live Births  2            Home Medications    Prior to Admission medications   Medication Sig Start Date End Date Taking? Authorizing Provider  ACCU-CHEK AVIVA PLUS test strip Use three times per day 11/21/18   Charlott Rakes, MD  amLODipine (NORVASC) 10 MG tablet TAKE 1 TABLET BY MOUTH DAILY 12/03/18   Charlott Rakes, MD  artificial tears (LACRILUBE) OINT  ophthalmic ointment Place into the left eye daily as needed for dry eyes. 12/03/18   Loura Halt A, NP  atorvastatin (LIPITOR) 40 MG tablet Take 1 tablet (40 mg total) by mouth daily. 10/31/18   Charlott Rakes, MD  Blood Glucose Monitoring Suppl (ACCU-CHEK AVIVA) device Use as instructed 05/22/18 05/22/19  Elsie Stain, MD  Dulaglutide (TRULICITY) 5.36 RW/4.3XV SOPN Inject 0.75 mg into the skin once a week. 11/21/18   Charlott Rakes, MD  glimepiride (AMARYL) 2 MG tablet TAKE 1 TABLET BY MOUTH DAILY BEFORE BREAKFAST 05/22/18   Elsie Stain, MD  hydrochlorothiazide (HYDRODIURIL) 25 MG tablet Take 1 tablet (25 mg total) by mouth daily. Take on tablet in the morning. 08/07/18   Charlott Rakes, MD  ibuprofen (ADVIL) 800 MG tablet 1 po bid prn pain 10/10/18   Meredith Pel, MD  Lancets (ACCU-CHEK SOFT [facility]) lancets Use as instructed 05/22/18   Elsie Stain, MD  metFORMIN (GLUCOPHAGE) 1000 MG tablet Take 1 tablet (1,000 mg total) by mouth 2 (two) times daily with a meal. Stop Janumet. 11/21/18   Charlott Rakes, MD  PARoxetine (  PAXIL) 10 MG tablet Take 1 tablet (10 mg total) by mouth daily. 05/22/18   Elsie Stain, MD  potassium chloride SA (K-DUR,KLOR-CON) 20 MEQ tablet Take 1 tablet (20 mEq total) by mouth daily. 05/22/18   Elsie Stain, MD    Family History Family History  Problem Relation Age of Onset  . Hypertension Mother   . Diabetes Father   . Goiter Sister   . Stroke Other     Social History Social History   Tobacco Use  . Smoking status: Current Every Day Smoker    Packs/day: 0.25    Types: Cigarettes  . Smokeless tobacco: Never Used  Substance Use Topics  . Alcohol use: Yes    Alcohol/week: 3.0 standard drinks    Types: 2 Cans of beer, 1 Shots of liquor per week  . Drug use: No     Allergies   Patient has no known allergies.   Review of Systems Review of Systems   Physical Exam Triage Vital Signs ED Triage Vitals  Enc Vitals Group     BP 12/03/18  1106 (!) 151/79     Pulse Rate 12/03/18 1106 96     Resp 12/03/18 1106 18     Temp 12/03/18 1106 98.3 F (36.8 C)     Temp Source 12/03/18 1106 Oral     SpO2 12/03/18 1106 96 %     Weight --      Height --      Head Circumference --      Peak Flow --      Pain Score 12/03/18 1107 3     Pain Loc --      Pain Edu? --      Excl. in [place]? --    No data found.  Updated Vital Signs BP (!) 151/79 (BP Location: Right Arm)   Pulse 96   Temp 98.3 F (36.8 C) (Oral)   Resp 18   SpO2 96%   Visual Acuity Right Eye Distance:   Left Eye Distance:   Bilateral Distance:    Right Eye Near:   Left Eye Near:    Bilateral Near:     Physical Exam Vitals signs and nursing note reviewed.  Constitutional:      General: She is not in acute distress.    Appearance: Normal appearance. She is not ill-appearing, toxic-appearing or diaphoretic.  HENT:     Head: Normocephalic and atraumatic.     Nose: Nose normal.     Mouth/Throat:     Pharynx: Oropharynx is clear.  Eyes:     Extraocular Movements: Extraocular movements intact.     Conjunctiva/sclera: Conjunctivae normal.     Pupils: Pupils are equal, round, and reactive to light.     Comments: Mild left scleral injection with tearing. No drainage.   Neck:     Musculoskeletal: Normal range of motion.  Pulmonary:     Effort: Pulmonary effort is normal.  Musculoskeletal: Normal range of motion.  Skin:    General: Skin is warm and dry.     Findings: No rash.  Neurological:     Mental Status: She is alert.  Psychiatric:        Mood and Affect: Mood normal.      UC Treatments / Results  Labs (all labs ordered are listed, but only abnormal results are displayed) Labs Reviewed - No data to display  EKG   Radiology No results found.  Procedures Procedures (including critical care time)  Medications  Ordered in UC Medications  tetracaine (PONTOCAINE) 0.5 % ophthalmic solution (has no administration in time range)    Initial  Impression / Assessment and Plan / UC Course  I have reviewed the triage vital signs and the nursing notes.  Pertinent labs & imaging results that were available during my care of the patient were reviewed by me and considered in my medical decision making (see chart for details).     Pt with eye irritation after getting hair gel in the eye. This occurred yesterday.  Applied tetracaine in the eye in clinic for comfort.  Recommended flushing the eye and applying Lacrilube for comfort.  Follow up as needed for continued or worsening symptoms  Final Clinical Impressions(s) / UC Diagnoses   Final diagnoses:  Pain of left eye     Discharge Instructions     Placed a drop in the eye hear to numb You can use the Lacrilube for soothing and comfort.  Follow up as needed for continued or worsening symptoms    ED Prescriptions    Medication Sig Dispense Auth. Provider   artificial tears (LACRILUBE) OINT ophthalmic ointment Place into the left eye daily as needed for dry eyes. 1 Tube Orvan July, NP     Controlled Substance Prescriptions [place] Controlled Substance Registry consulted? no   Orvan July, NP 12/04/18 1606

## 2018-12-10 ENCOUNTER — Encounter (INDEPENDENT_AMBULATORY_CARE_PROVIDER_SITE_OTHER): Payer: Self-pay | Admitting: Primary Care

## 2018-12-10 ENCOUNTER — Ambulatory Visit (INDEPENDENT_AMBULATORY_CARE_PROVIDER_SITE_OTHER): Payer: BC Managed Care – PPO | Admitting: Primary Care

## 2018-12-10 ENCOUNTER — Other Ambulatory Visit: Payer: Self-pay

## 2018-12-10 VITALS — BP 111/67 | HR 79 | Temp 97.5°F | Ht 60.0 in | Wt 218.0 lb

## 2018-12-10 DIAGNOSIS — Z1211 Encounter for screening for malignant neoplasm of colon: Secondary | ICD-10-CM

## 2018-12-10 DIAGNOSIS — E876 Hypokalemia: Secondary | ICD-10-CM | POA: Diagnosis not present

## 2018-12-10 DIAGNOSIS — Z23 Encounter for immunization: Secondary | ICD-10-CM

## 2018-12-10 DIAGNOSIS — G4733 Obstructive sleep apnea (adult) (pediatric): Secondary | ICD-10-CM

## 2018-12-10 DIAGNOSIS — E1165 Type 2 diabetes mellitus with hyperglycemia: Secondary | ICD-10-CM | POA: Diagnosis not present

## 2018-12-10 DIAGNOSIS — Z09 Encounter for follow-up examination after completed treatment for conditions other than malignant neoplasm: Secondary | ICD-10-CM

## 2018-12-10 DIAGNOSIS — N946 Dysmenorrhea, unspecified: Secondary | ICD-10-CM | POA: Diagnosis not present

## 2018-12-10 DIAGNOSIS — Z6841 Body Mass Index (BMI) 40.0 and over, adult: Secondary | ICD-10-CM

## 2018-12-10 DIAGNOSIS — E049 Nontoxic goiter, unspecified: Secondary | ICD-10-CM

## 2018-12-10 DIAGNOSIS — I10 Essential (primary) hypertension: Secondary | ICD-10-CM | POA: Diagnosis not present

## 2018-12-10 DIAGNOSIS — F17210 Nicotine dependence, cigarettes, uncomplicated: Secondary | ICD-10-CM

## 2018-12-10 LAB — GLUCOSE, POCT (MANUAL RESULT ENTRY): POC Glucose: 154 mg/dl — AB (ref 70–99)

## 2018-12-10 NOTE — Patient Instructions (Signed)
Normal colon cancer screening.  CDC recommends colorectal screening from ages 60-75. Screening can begin at 73 or earlier in some cases. This screening is used for a disease when no symptoms are present . Diagnostic test is used for symptoms examples blood in stool, colorectal polyps or coloector cancer, family history or inflammatory bowel disease - chron's or ulcerative colitis .(USPSTF) Appointment schedule for 12/12/2018 at 4 pm with [facility] [place]

## 2018-12-10 NOTE — Progress Notes (Signed)
Established Patient Office Visit  Subjective:  Patient ID: Melissa Valdez, female    DOB: 09/21/1965  Age: 53 y.o. MRN: [id number]  CC:  Chief Complaint  Patient presents with  . Annual Exam    HPI Melissa Valdez presents for annual physical and labs.   Past Medical History:  Diagnosis Date  . Diabetes mellitus without complication ([place])   . GERD (gastroesophageal reflux disease)   . Hypertension   . Obesity, Class III, BMI 40-49.9 (morbid obesity) ([place])   . Sleep apnea    cpap-     Past Surgical History:  Procedure Laterality Date  . boil on leg removed   1980s  . TUBAL LIGATION  1999    Family History  Problem Relation Age of Onset  . Hypertension Mother   . Diabetes Father   . Goiter Sister   . Stroke Other     Social History   Socioeconomic History  . Marital status: Single    Spouse name: Not on file  . Number of children: Not on file  . Years of education: Not on file  . Highest education level: Not on file  Occupational History  . Not on file  Social Needs  . Financial resource strain: Not on file  . Food insecurity    Worry: Not on file    Inability: Not on file  . Transportation needs    Medical: Not on file    Non-medical: Not on file  Tobacco Use  . Smoking status: Current Every Day Smoker    Packs/day: 0.25    Types: Cigarettes  . Smokeless tobacco: Never Used  Substance and Sexual Activity  . Alcohol use: Yes    Alcohol/week: 3.0 standard drinks    Types: 2 Cans of beer, 1 Shots of liquor per week  . Drug use: No  . Sexual activity: Yes    Partners: Male    Birth control/protection: Surgical    Comment: Last Encnounter 4 days ago  Lifestyle  . Physical activity    Days per week: Not on file    Minutes per session: Not on file  . Stress: Not on file  Relationships  . Social Herbalist on phone: Not on file    Gets together: Not on file    Attends religious service: Not on file    Active member of club or  organization: Not on file    Attends meetings of clubs or organizations: Not on file    Relationship status: Not on file  . Intimate partner violence    Fear of current or ex partner: Not on file    Emotionally abused: Not on file    Physically abused: Not on file    Forced sexual activity: Not on file  Other Topics Concern  . Not on file  Social History Narrative  . Not on file    Outpatient Medications Prior to Visit  Medication Sig Dispense Refill  . ACCU-CHEK AVIVA PLUS test strip Use three times per day 100 each 9  . amLODipine (NORVASC) 10 MG tablet TAKE 1 TABLET BY MOUTH DAILY 90 tablet 0  . artificial tears (LACRILUBE) OINT ophthalmic ointment Place into the left eye daily as needed for dry eyes. 1 Tube 1  . atorvastatin (LIPITOR) 40 MG tablet Take 1 tablet (40 mg total) by mouth daily. 90 tablet 3  . Blood Glucose Monitoring Suppl (ACCU-CHEK AVIVA) device Use as instructed 1 each 0  . Dulaglutide (TRULICITY)  0.75 MG/0.5ML SOPN Inject 0.75 mg into the skin once a week. 2 mL 2  . glimepiride (AMARYL) 2 MG tablet TAKE 1 TABLET BY MOUTH DAILY BEFORE BREAKFAST 90 tablet 1  . hydrochlorothiazide (HYDRODIURIL) 25 MG tablet Take 1 tablet (25 mg total) by mouth daily. Take on tablet in the morning. 90 tablet 1  . ibuprofen (ADVIL) 800 MG tablet 1 po bid prn pain 60 tablet 3  . Lancets (ACCU-CHEK SOFT TOUCH) lancets Use as instructed 100 each 12  . metFORMIN (GLUCOPHAGE) 1000 MG tablet Take 1 tablet (1,000 mg total) by mouth 2 (two) times daily with a meal. Stop Janumet. 180 tablet 0  . PARoxetine (PAXIL) 10 MG tablet Take 1 tablet (10 mg total) by mouth daily. 30 tablet 5  . potassium chloride SA (K-DUR,KLOR-CON) 20 MEQ tablet Take 1 tablet (20 mEq total) by mouth daily. 30 tablet 3   No facility-administered medications prior to visit.     No Known Allergies  ROS Review of Systems  Endocrine: Positive for polyuria.  Musculoskeletal:       Knee pain  Psychiatric/Behavioral:  Positive for decreased concentration and sleep disturbance.      Objective:    Physical Exam  Constitutional: She is oriented to person, place, and time. She appears well-developed and well-nourished.  HENT:  Head: Normocephalic.  goiter  Eyes: Pupils are equal, round, and reactive to light. EOM are normal.  Neck: Normal range of motion. Neck supple.  Cardiovascular: Normal rate and regular rhythm.  Pulmonary/Chest: Effort normal and breath sounds normal.  Abdominal: Soft. Bowel sounds are normal.  Musculoskeletal: Normal range of motion.  Neurological: She is alert and oriented to person, place, and time.  Skin: Skin is warm and dry.  Psychiatric: She has a normal mood and affect.    BP 111/67 (BP Location: Right Arm, Patient Position: Sitting, Cuff Size: Large)   Pulse 79   Temp (!) 97.5 F (36.4 C) (Tympanic)   Ht 5' (1.524 m)   Wt 218 lb (98.9 kg)   LMP 12/01/2018 (Exact Date)   SpO2 93%   BMI 42.58 kg/m  Wt Readings from Last 3 Encounters:  12/10/18 218 lb (98.9 kg)  08/24/18 214 lb (97.1 kg)  05/22/18 214 lb 3.2 oz (97.2 kg)     Health Maintenance Due  Topic Date Due  . OPHTHALMOLOGY EXAM  08/15/2018  . INFLUENZA VACCINE  11/15/2018    There are no preventive care reminders to display for this patient.  Lab Results  Component Value Date   TSH 0.772 03/03/2018   Lab Results  Component Value Date   WBC 20.0 (HH) 08/28/2018   HGB 13.7 08/28/2018   HCT 42.4 08/28/2018   MCV 85 08/28/2018   PLT 350 08/28/2018   Lab Results  Component Value Date   NA 134 08/28/2018   K 3.9 08/28/2018   CO2 21 08/28/2018   GLUCOSE 254 (H) 08/28/2018   BUN 14 08/28/2018   CREATININE 0.59 08/28/2018   BILITOT 0.3 08/28/2018   ALKPHOS 107 08/28/2018   AST 18 08/28/2018   ALT 29 08/28/2018   PROT 7.4 08/28/2018   ALBUMIN 4.7 08/28/2018   CALCIUM 10.1 08/28/2018   ANIONGAP 10 08/20/2017   Lab Results  Component Value Date   CHOL 174 08/28/2018   Lab Results   Component Value Date   HDL 68 08/28/2018   Lab Results  Component Value Date   LDLCALC 88 08/28/2018   Lab Results  Component Value  Date   TRIG 88 08/28/2018   Lab Results  Component Value Date   CHOLHDL 2.6 08/28/2018   Lab Results  Component Value Date   HGBA1C 10.2 (A) 11/21/2018      Assessment & Plan:  Melissa Valdez was seen today for annual exam.  Diagnoses and all orders for this visit:  Uncontrolled type 2 diabetes mellitus with  hyperglycemia ([place])- Followed by Clinical Pharmacist  ADA recommends the following therapeutic goals for glycemic control related to A1c measurements: Goal of therapy: Less than 6.5 hemoglobin A1c.  Reference clinical practice recommendations. Foods that are high in carbohydrates are the following rice, potatoes, breads, sugars, and pastas.  Reduction in the intake (eating) will assist in lowering your blood sugars. -     Glucose (CBG) -     CBC with Differential -     CMP14+EGFR -     Lipid Panel  Dysmenorrhea Dysmenorrhea is defined as pelvic pain occurring at or around the time of menstrual cycles leading to absenteeism for women less than 30.  Primary cause is elevated prostaglandin production through indirect hormonal control which leads to decreasing and progesterone at the time of menses leading to increase in progestogen causing a non-rhythmic hypercontractility and increase urine muscle tone with vasoconstriction and resultant brain ischemia. -     CBC with Differential -     CMP14+EGFR -     Lipid Panel  Essential hypertension Goal BP:  For patients younger than 60: Goal BP < 140/90. For patients 60 and older: Goal BP < 150/90. For patients with diabetes: Goal BP < 140/90. Your most recent BP: (!) 187/112 mmHg.  Take your medications faithfully as instructed. Maintain a healthy weight. Get at least 150 minutes of aerobic exercise per week. Minimize salt intake. Minimize alcohol intake  -     CBC with Differential -      CMP14+EGFR -     Lipid Panel  Hypokalemia Potassium Content of Foods  The body needs potassium to control blood pressure and to keep the muscles and nervous system healthy. Here are some healthy foods below that are high in potassium. Also you can get the white label salt of "NO SALT" salt substitute, 1/4 teaspoon of this is equivalent to 43mq potassium.   FOODS AND DRINKS HIGH IN POTASSIUM FOODS MODERATE IN POTASSIUM   Fruits  Avocado (cubed),  c / 50 g.  Banana (sliced), 75 g.  Cantaloupe (cubed), 80 g.  Honeydew, 1 wedge / 85 g.  Kiwi (sliced), 90 g.  Nectarine, 1 small / 129 g.  Orange, 1 medium / 131 g. Vegetables  Artichoke,  of a medium / 64 g.  Asparagus (boiled), 90 g..  Broccoli (boiled), 78 g.  Brussels sprout (boiled), 78 g.  Butternut squash (baked), 103 g.  Chickpea (cooked), 82 g.  Green peas (cooked), 80 g.  Kidney beans (cooked), 5 tbsp / 55 g.  Lima beans (cooked),  c / 43 g.  Navy beans (cooked),  c / 61 g.  Spinach (cooked),  c / 45 g.  Sweet potato (baked),  c / 50 g.  Tomato (chopped or sliced), 90 g.  Vegetable juice.  White mushrooms (cooked), 78 g.  Yam (cooked or baked),  c / 34 g.  Zucchini squash (boiled), 90 g. Other Foods and Drinks  Almonds (whole),  c / 36 g.  Fish, 3 oz / 85 g.  Nonfat fruit variety yogurt, 123 g.  Pistachio nuts, 1 oz / 28 g.  Pumpkin seeds, 1 oz / 28 g.  Red meat (broiled, cooked, grilled), 3 oz / 85 g.  Scallops (steamed), 3 oz / 85 g.  Spaghetti sauce,  c / 66 g.  Sunflower seeds (dry roasted), 1 oz / 28 g.  Veggie burger, 1 patty / 70 g. Fruits  Grapefruit,  of the fruit / 292 g  Plums (sliced), 83 g.  Tangerine, 1 large / 120 g. Vegetables  Carrots (boiled), 78 g.  Carrots (sliced), 61 g.  Rhubarb (cooked with sugar), 120 g.  Rutabaga (cooked), 120 g.  Yellow snap beans (cooked), 63 g. Other Foods and Drinks   Chicken breast (roasted and chopped),  c /  70 g.  Pita bread, 1 large / 64 g.  Shrimp (steamed), 4 oz / 113 g.  Swiss cheese (diced), 70 g.       Morbid obesity with BMI of 40.0-44.9, adult ([place]) Morbid Obesity is indicating an excess in caloric intake or underlining conditions. This may lead to other co-morbidities. Lifestyle modifications of diet and exercise may reduce obesity.   OSA (obstructive sleep apnea) Patient presents with possible obstructive sleep apnea. Patent has a history of symptoms of OSA . Patient generally gets 2-4 hours of sleep per night, and states they generally have poor. Snoring of moderate severity Previously had a sleep study 02/03/2014 and her CPAP no longer works. -     Nocturnal polysomnography (NPSG); Future  Goiter -     TSH + free T4  Colon cancer screening -     Ambulatory referral to Gastroenterology  Ophthalmology follow-up encounter -     Ambulatory referral to Ophthalmology     No orders of the defined types were placed in this encounter.   Follow-up: Return in about 2 months (around 02/16/2019) for DM management .    Kerin Perna, NP

## 2018-12-10 NOTE — Progress Notes (Signed)
Pt complains of insomnia and leg cramps

## 2018-12-11 LAB — CMP14+EGFR
ALT: 22 IU/L (ref 0–32)
AST: 14 IU/L (ref 0–40)
Albumin/Globulin Ratio: 2.1 (ref 1.2–2.2)
Albumin: 4.5 g/dL (ref 3.8–4.9)
Alkaline Phosphatase: 101 IU/L (ref 39–117)
BUN/Creatinine Ratio: 18 (ref 9–23)
BUN: 9 mg/dL (ref 6–24)
Bilirubin Total: 0.3 mg/dL (ref 0.0–1.2)
CO2: 29 mmol/L (ref 20–29)
Calcium: 9.4 mg/dL (ref 8.7–10.2)
Chloride: 96 mmol/L (ref 96–106)
Creatinine, Ser: 0.5 mg/dL — ABNORMAL LOW (ref 0.57–1.00)
GFR calc Af Amer: 128 mL/min/{1.73_m2} (ref >59)
GFR calc non Af Amer: 111 mL/min/{1.73_m2} (ref >59)
Globulin, Total: 2.1 g/dL (ref 1.5–4.5)
Glucose: 140 mg/dL — ABNORMAL HIGH (ref 65–99)
Potassium: 3.4 mmol/L — ABNORMAL LOW (ref 3.5–5.2)
Sodium: 143 mmol/L (ref 134–144)
Total Protein: 6.6 g/dL (ref 6.0–8.5)

## 2018-12-11 LAB — CBC WITH DIFFERENTIAL/PLATELET
Basophils Absolute: 0.1 10*3/uL (ref 0.0–0.2)
Basos: 0 %
EOS (ABSOLUTE): 0.2 10*3/uL (ref 0.0–0.4)
Eos: 2 %
Hematocrit: 44.5 % (ref 34.0–46.6)
Hemoglobin: 14.2 g/dL (ref 11.1–15.9)
Immature Grans (Abs): 0.1 10*3/uL (ref 0.0–0.1)
Immature Granulocytes: 1 %
Lymphocytes Absolute: 3.2 10*3/uL — ABNORMAL HIGH (ref 0.7–3.1)
Lymphs: 24 %
MCH: 26.9 pg (ref 26.6–33.0)
MCHC: 31.9 g/dL (ref 31.5–35.7)
MCV: 84 fL (ref 79–97)
Monocytes Absolute: 0.9 10*3/uL (ref 0.1–0.9)
Monocytes: 7 %
Neutrophils Absolute: 9 10*3/uL — ABNORMAL HIGH (ref 1.4–7.0)
Neutrophils: 66 %
Platelets: 345 10*3/uL (ref 150–450)
RBC: 5.28 x10E6/uL (ref 3.77–5.28)
RDW: 13.9 % (ref 11.7–15.4)
WBC: 13.5 10*3/uL — ABNORMAL HIGH (ref 3.4–10.8)

## 2018-12-11 LAB — LIPID PANEL
Chol/HDL Ratio: 2.5 ratio (ref 0.0–4.4)
Cholesterol, Total: 123 mg/dL (ref 100–199)
HDL: 49 mg/dL (ref >39)
LDL Calculated: 55 mg/dL (ref 0–99)
Triglycerides: 97 mg/dL (ref 0–149)
VLDL Cholesterol Cal: 19 mg/dL (ref 5–40)

## 2018-12-11 LAB — TSH+FREE T4
Free T4: 1.52 ng/dL (ref 0.82–1.77)
TSH: 0.099 u[IU]/mL — ABNORMAL LOW (ref 0.450–4.500)

## 2018-12-12 ENCOUNTER — Telehealth (INDEPENDENT_AMBULATORY_CARE_PROVIDER_SITE_OTHER): Payer: Self-pay

## 2018-12-12 NOTE — Telephone Encounter (Signed)
Patient is aware that labs are normal. [name] S Roberts, CMA  

## 2018-12-12 NOTE — Telephone Encounter (Signed)
-----   Message from Kerin Perna, NP sent at 12/12/2018  2:17 PM EDT ----- I have reviewed all labs and they are normal/unremarkable elevations are related to seasonal allergies. Please notify patient of the results. Have them schedule appointment if there are any other concerns or questions. Thank you.

## 2018-12-31 ENCOUNTER — Other Ambulatory Visit: Payer: Self-pay | Admitting: Gastroenterology

## 2019-01-16 ENCOUNTER — Encounter (HOSPITAL_BASED_OUTPATIENT_CLINIC_OR_DEPARTMENT_OTHER): Payer: BC Managed Care – PPO | Admitting: Internal Medicine

## 2019-01-26 ENCOUNTER — Other Ambulatory Visit (INDEPENDENT_AMBULATORY_CARE_PROVIDER_SITE_OTHER): Payer: Self-pay | Admitting: Critical Care Medicine

## 2019-01-26 DIAGNOSIS — I10 Essential (primary) hypertension: Secondary | ICD-10-CM

## 2019-01-28 ENCOUNTER — Other Ambulatory Visit: Payer: Self-pay | Admitting: Orthopedic Surgery

## 2019-01-28 NOTE — Telephone Encounter (Signed)
Please advise 

## 2019-01-30 ENCOUNTER — Other Ambulatory Visit: Payer: Self-pay

## 2019-01-30 ENCOUNTER — Ambulatory Visit (HOSPITAL_BASED_OUTPATIENT_CLINIC_OR_DEPARTMENT_OTHER): Payer: BC Managed Care – PPO | Attending: Primary Care | Admitting: Internal Medicine

## 2019-01-30 VITALS — Ht 60.0 in | Wt 226.0 lb

## 2019-01-30 DIAGNOSIS — G4733 Obstructive sleep apnea (adult) (pediatric): Secondary | ICD-10-CM | POA: Insufficient documentation

## 2019-02-02 ENCOUNTER — Other Ambulatory Visit: Payer: Self-pay

## 2019-02-06 ENCOUNTER — Encounter (INDEPENDENT_AMBULATORY_CARE_PROVIDER_SITE_OTHER): Payer: Self-pay | Admitting: Primary Care

## 2019-02-06 ENCOUNTER — Ambulatory Visit (INDEPENDENT_AMBULATORY_CARE_PROVIDER_SITE_OTHER): Payer: BC Managed Care – PPO | Admitting: Primary Care

## 2019-02-06 ENCOUNTER — Other Ambulatory Visit: Payer: Self-pay

## 2019-02-06 VITALS — BP 148/84 | HR 73 | Temp 97.3°F | Ht 60.0 in | Wt 215.6 lb

## 2019-02-06 DIAGNOSIS — I10 Essential (primary) hypertension: Secondary | ICD-10-CM

## 2019-02-06 DIAGNOSIS — Z6841 Body Mass Index (BMI) 40.0 and over, adult: Secondary | ICD-10-CM | POA: Diagnosis not present

## 2019-02-06 DIAGNOSIS — R739 Hyperglycemia, unspecified: Secondary | ICD-10-CM

## 2019-02-06 DIAGNOSIS — E1165 Type 2 diabetes mellitus with hyperglycemia: Secondary | ICD-10-CM | POA: Diagnosis not present

## 2019-02-06 LAB — POCT URINALYSIS DIP (CLINITEK)
Bilirubin, UA: NEGATIVE
Glucose, UA: 500 mg/dL — AB
Ketones, POC UA: NEGATIVE mg/dL
Leukocytes, UA: NEGATIVE
Nitrite, UA: NEGATIVE
POC PROTEIN,UA: NEGATIVE
Spec Grav, UA: 1.015 (ref 1.010–1.025)
Urobilinogen, UA: 0.2 E.U./dL
pH, UA: 7 (ref 5.0–8.0)

## 2019-02-06 LAB — GLUCOSE, POCT (MANUAL RESULT ENTRY)
POC Glucose: 368 mg/dl — AB (ref 70–99)
POC Glucose: 298 mg/dl — AB (ref 70–99)

## 2019-02-06 MED ORDER — INSULIN ASPART 100 UNIT/ML ~~LOC~~ SOLN
15.0000 [IU] | Freq: Once | SUBCUTANEOUS | Status: AC
  Administered 2019-02-06: 15 [IU] via SUBCUTANEOUS

## 2019-02-06 MED ORDER — LISINOPRIL 10 MG PO TABS: 10.0000 mg | ORAL_TABLET | Freq: Every day | ORAL | 0 refills | Status: AC

## 2019-02-06 NOTE — Patient Instructions (Signed)

## 2019-02-06 NOTE — Progress Notes (Signed)
Established Patient Office Visit  Subjective:  Patient ID: Melissa Valdez, female    DOB: [birth year]  Age: 53 y.o. MRN: [id number]  CC:  Chief Complaint  Patient presents with  . Follow-up    DM management    HPI Melissa Valdez presents for management of uncontrolled diabetes. Today her CBG is twice the number 368 from last month 154. She was suppose to bring in her blood sugar logged and forgot. She admits to not following her recommended low carbohydrate diet. We had a hard but necessary conversation about taking responsibility in her own health.  Past Medical History:  Diagnosis Date  . Diabetes mellitus without complication ([place])   . GERD (gastroesophageal reflux disease)   . Hypertension   . Obesity, Class III, BMI 40-49.9 (morbid obesity) ([place])   . Sleep apnea    cpap-     Past Surgical History:  Procedure Laterality Date  . boil on leg removed   1980s  . TUBAL LIGATION  1999    Family History  Problem Relation Age of Onset  . Hypertension Mother   . Diabetes Father   . Goiter Sister   . Stroke Other     Social History   Socioeconomic History  . Marital status: Single    Spouse name: Not on file  . Number of children: Not on file  . Years of education: Not on file  . Highest education level: Not on file  Occupational History  . Not on file  Social Needs  . Financial resource strain: Not on file  . Food insecurity    Worry: Not on file    Inability: Not on file  . Transportation needs    Medical: Not on file    Non-medical: Not on file  Tobacco Use  . Smoking status: Current Every Day Smoker    Packs/day: 0.25    Types: Cigarettes  . Smokeless tobacco: Never Used  Substance and Sexual Activity  . Alcohol use: Yes    Alcohol/week: 3.0 standard drinks    Types: 2 Cans of beer, 1 Shots of liquor per week  . Drug use: No  . Sexual activity: Yes    Partners: Male    Birth control/protection: Surgical    Comment: Last Encnounter 4 days ago   Lifestyle  . Physical activity    Days per week: Not on file    Minutes per session: Not on file  . Stress: Not on file  Relationships  . Social Herbalist on phone: Not on file    Gets together: Not on file    Attends religious service: Not on file    Active member of club or organization: Not on file    Attends meetings of clubs or organizations: Not on file    Relationship status: Not on file  . Intimate partner violence    Fear of current or ex partner: Not on file    Emotionally abused: Not on file    Physically abused: Not on file    Forced sexual activity: Not on file  Other Topics Concern  . Not on file  Social History Narrative  . Not on file    Outpatient Medications Prior to Visit  Medication Sig Dispense Refill  . ACCU-CHEK AVIVA PLUS test strip Use three times per day 100 each 9  . amLODipine (NORVASC) 10 MG tablet TAKE 1 TABLET BY MOUTH DAILY 90 tablet 0  . artificial tears (LACRILUBE) OINT ophthalmic ointment Place  into the left eye daily as needed for dry eyes. 1 Tube 1  . atorvastatin (LIPITOR) 40 MG tablet Take 1 tablet (40 mg total) by mouth daily. 90 tablet 3  . Blood Glucose Monitoring Suppl (ACCU-CHEK AVIVA) device Use as instructed 1 each 0  . Dulaglutide (TRULICITY) A999333 0000000 SOPN Inject 0.75 mg into the skin once a week. 2 mL 2  . glimepiride (AMARYL) 2 MG tablet TAKE 1 TABLET BY MOUTH DAILY BEFORE BREAKFAST 90 tablet 1  . hydrochlorothiazide (HYDRODIURIL) 25 MG tablet TAKE 1 TABLET BY MOUTH DAILY IN THE MORNING 90 tablet 1  . ibuprofen (ADVIL) 800 MG tablet TAKE 1 TABLET BY MOUTH TWICE DAILY AS NEEDED FOR PAIN 60 tablet 3  . Lancets (ACCU-CHEK SOFT TOUCH) lancets Use as instructed 100 each 12  . metFORMIN (GLUCOPHAGE) 1000 MG tablet Take 1 tablet (1,000 mg total) by mouth 2 (two) times daily with a meal. Stop Janumet. 180 tablet 0  . olopatadine (PATANOL) 0.1 % ophthalmic solution INT 1 GTT INTO OU BID    . PARoxetine (PAXIL) 10 MG  tablet Take 1 tablet (10 mg total) by mouth daily. 30 tablet 5  . potassium chloride SA (K-DUR,KLOR-CON) 20 MEQ tablet Take 1 tablet (20 mEq total) by mouth daily. 30 tablet 3   No facility-administered medications prior to visit.     No Known Allergies  ROS Review of Systems    Objective:    Physical Exam  BP (!) 148/84 (BP Location: Right Arm, Patient Position: Sitting, Cuff Size: Normal)   Pulse 73   Temp (!) 97.3 F (36.3 C) (Temporal)   Ht 5' (1.524 m)   Wt 215 lb 9.6 oz (97.8 kg)   SpO2 96%   BMI 42.11 kg/m  Wt Readings from Last 3 Encounters:  02/06/19 215 lb 9.6 oz (97.8 kg)  01/30/19 226 lb (102.5 kg)  12/10/18 218 lb (98.9 kg)     Health Maintenance Due  Topic Date Due  . OPHTHALMOLOGY EXAM  08/15/2018    There are no preventive care reminders to display for this patient.  Lab Results  Component Value Date   TSH 0.099 (L) 12/10/2018   Lab Results  Component Value Date   WBC 13.5 (H) 12/10/2018   HGB 14.2 12/10/2018   HCT 44.5 12/10/2018   MCV 84 12/10/2018   PLT 345 12/10/2018   Lab Results  Component Value Date   NA 143 12/10/2018   K 3.4 (L) 12/10/2018   CO2 29 12/10/2018   GLUCOSE 140 (H) 12/10/2018   BUN 9 12/10/2018   CREATININE 0.50 (L) 12/10/2018   BILITOT 0.3 12/10/2018   ALKPHOS 101 12/10/2018   AST 14 12/10/2018   ALT 22 12/10/2018   PROT 6.6 12/10/2018   ALBUMIN 4.5 12/10/2018   CALCIUM 9.4 12/10/2018   ANIONGAP 10 08/20/2017   Lab Results  Component Value Date   CHOL 123 12/10/2018   Lab Results  Component Value Date   HDL 49 12/10/2018   Lab Results  Component Value Date   LDLCALC 55 12/10/2018   Lab Results  Component Value Date   TRIG 97 12/10/2018   Lab Results  Component Value Date   CHOLHDL 2.5 12/10/2018   Lab Results  Component Value Date   HGBA1C 10.2 (A) 11/21/2018      Assessment & Plan:  Melissa Valdez was seen today for follow-up.  Diagnoses and all orders for this visit:  Uncontrolled type  2 diabetes mellitus with hyperglycemia [facility]) Not  at all at goal and not adhering ot diet modification discussed DM was a microvascular disease that can affect every part of her body, diabetic retinopathy leading to loss of vision, neuropathy numbness and tingling in hands and feet, amputations due to decreased circulation and decreased wound healing, amputations and renal disease causing dialysis.  Melissa Valdez began to cry during this much needed conversation and is going to take needed steps for herself to maintain her diabetes and weight.  A1c is due next month agreed to extended for extra month for her to work on goals that were set for her to work on. -     Glucose (CBG)  Hyperglycemia -     insulin aspart (novoLOG) injection 15 Units -     POCT URINALYSIS DIP (CLINITEK)  Essential hypertension Reviewed Bp medication no ARB or ACE for renal protection and Bp is elevated at this visit 148/84- compliant with medication but not diet compliant. Added Lisinopril 10mg  daily.  Morbid obesity with BMI of 40.0-44.9, adult ([place]) We discussed excising 150 hours a week and she has not started. Had a heart to heart you have to take responsibility for your own health I will meet you half way together we can control HTN, DM and work on loosing weight.   Other orders -     lisinopril (ZESTRIL) 10 MG tablet; Take 1 tablet (10 mg total) by mouth daily.    Meds ordered this encounter  Medications  . insulin aspart (novoLOG) injection 15 Units  . lisinopril (ZESTRIL) 10 MG tablet    Sig: Take 1 tablet (10 mg total) by mouth daily.    Dispense:  90 tablet    Refill:  0    Follow-up: Return in about 2 months (around 04/08/2019) for IN PERSON- DM/HTN.    Kerin Perna, NP

## 2019-02-07 DIAGNOSIS — G4733 Obstructive sleep apnea (adult) (pediatric): Secondary | ICD-10-CM

## 2019-02-07 NOTE — Procedures (Signed)
Patient Name: Melissa Valdez, Melissa Valdez Date: 01/30/2019 Gender: Female D.O.B: [birth year] Age (years): 20 Referring Provider: Kerin Perna NP Height (inches): 40 Interpreting Physician: Baird Lyons MD, ABSM Weight (lbs): 226 RPSGT: Earney [place] BMI: 44 MRN: [id number] Neck Size: 18.00  CLINICAL INFORMATION Sleep Study Type: NPSG Indication for sleep study: OSA Epworth Sleepiness Score: 8  SLEEP STUDY TECHNIQUE As per the AASM Manual for the Scoring of Sleep and Associated Events v2.3 (April 2016) with a hypopnea requiring 4% desaturations.  The channels recorded and monitored were frontal, central and occipital EEG, electrooculogram (EOG), submentalis EMG (chin), nasal and oral airflow, thoracic and abdominal wall motion, anterior tibialis EMG, snore microphone, electrocardiogram, and pulse oximetry.  MEDICATIONS Medications self-administered by patient taken the night of the study : none reported  SLEEP ARCHITECTURE The study was initiated at 9:13:32 PM and ended at 4:25:36 AM.  Sleep onset time was 14.9 minutes and the sleep efficiency was 82.4%%. The total sleep time was 356 minutes.  Stage REM latency was 68.5 minutes.  The patient spent 2.0%% of the night in stage N1 sleep, 76.7%% in stage N2 sleep, 0.0%% in stage N3 and 21.4% in REM.  Alpha intrusion was absent.  Supine sleep was 26.88%.  RESPIRATORY PARAMETERS The overall apnea/hypopnea index (AHI) was 19.4 per hour. There were 43 total apneas, including 43 obstructive, 0 central and 0 mixed apneas. There were 72 hypopneas and 18 RERAs.  The AHI during Stage REM sleep was 34.7 per hour.  AHI while supine was 28.2 per hour.  The mean oxygen saturation was 92.0%. The minimum SpO2 during sleep was 53.0%.  moderate snoring was noted during this study.  CARDIAC DATA The 2 lead EKG demonstrated sinus rhythm. The mean heart rate was 79.5 beats per minute. Other EKG findings include: None.  LEG MOVEMENT  DATA The total PLMS were 0 with a resulting PLMS index of 0.0. Associated arousal with leg movement index was 0.0 .  IMPRESSIONS - Moderate obstructive sleep apnea occurred during this study (AHI = 19.4/h). - No significant central sleep apnea occurred during this study (CAI = 0.0/h). - Severe oxygen desaturation was noted during this study (Min O2 = 53.0%). - Supplemental O2 added per protocol 1 L at 10:56 PM. Mean sat on 1 L was 92%. - The patient snored with moderate snoring volume. - No cardiac abnormalities were noted during this study. - Clinically significant periodic limb movements did not occur during sleep. No significant associated arousals.  DIAGNOSIS - Obstructive Sleep Apnea (327.23 [G47.33 ICD-10]) - Nocturnal Hypoxemia (327.26 [G47.36 ICD-10])  RECOMMENDATIONS - Suggest CPAP titration sleep study to determine if CPAP is sufficient to control both apnea and Nocturnal Hypoxemia, or patient may qualify for supplemental O2 as well.. - Positional therapy avoiding supine position during sleep. - Be carefull with alcohol, sedatives and other CNS depressants that may worsen sleep apnea and disrupt normal sleep architecture. - Sleep hygiene should be reviewed to assess factors that may improve sleep quality. - Weight management and regular exercise should be initiated or continued if appropriate.  [Electronically signed] 02/07/2019 11:37 AM  Baird Lyons MD, ABSM Diplomate, American Board of Sleep Medicine   NPI: [id number]                          [employer], [place] [facility]  ELECTRONICALLY SIGNED ON:  02/07/2019, 11:32 AM [place] PH: [phone]   FX: [phone]  [place] [facility]

## 2019-02-08 ENCOUNTER — Encounter (INDEPENDENT_AMBULATORY_CARE_PROVIDER_SITE_OTHER): Payer: Self-pay | Admitting: Primary Care

## 2019-02-09 ENCOUNTER — Other Ambulatory Visit: Payer: Self-pay | Admitting: Primary Care

## 2019-02-09 DIAGNOSIS — Z1231 Encounter for screening mammogram for malignant neoplasm of breast: Secondary | ICD-10-CM

## 2019-02-15 ENCOUNTER — Other Ambulatory Visit (INDEPENDENT_AMBULATORY_CARE_PROVIDER_SITE_OTHER): Payer: Self-pay | Admitting: Primary Care

## 2019-02-15 DIAGNOSIS — G4733 Obstructive sleep apnea (adult) (pediatric): Secondary | ICD-10-CM

## 2019-02-22 ENCOUNTER — Emergency Department (HOSPITAL_COMMUNITY)
Admission: EM | Admit: 2019-02-22 | Discharge: 2019-03-17 | Disposition: E | Payer: BC Managed Care – PPO | Attending: Emergency Medicine | Admitting: Emergency Medicine

## 2019-02-22 DIAGNOSIS — E119 Type 2 diabetes mellitus without complications: Secondary | ICD-10-CM | POA: Diagnosis not present

## 2019-02-22 DIAGNOSIS — I469 Cardiac arrest, cause unspecified: Secondary | ICD-10-CM

## 2019-02-22 DIAGNOSIS — Z7984 Long term (current) use of oral hypoglycemic drugs: Secondary | ICD-10-CM | POA: Diagnosis not present

## 2019-02-22 DIAGNOSIS — I10 Essential (primary) hypertension: Secondary | ICD-10-CM | POA: Insufficient documentation

## 2019-02-22 DIAGNOSIS — F17210 Nicotine dependence, cigarettes, uncomplicated: Secondary | ICD-10-CM | POA: Diagnosis not present

## 2019-02-22 DIAGNOSIS — Z79899 Other long term (current) drug therapy: Secondary | ICD-10-CM | POA: Insufficient documentation

## 2019-02-22 LAB — POCT I-STAT EG7
Acid-base deficit: 20 mmol/L — ABNORMAL HIGH (ref 0.0–2.0)
Bicarbonate: 12.1 mmol/L — ABNORMAL LOW (ref 20.0–28.0)
Calcium, Ion: 0.99 mmol/L — ABNORMAL LOW (ref 1.15–1.40)
HCT: 44 % (ref 36.0–46.0)
Hemoglobin: 15 g/dL (ref 12.0–15.0)
O2 Saturation: 65 %
Potassium: 4.6 mmol/L (ref 3.5–5.1)
Sodium: 135 mmol/L (ref 135–145)
TCO2: 14 mmol/L — ABNORMAL LOW (ref 22–32)
pCO2, Ven: 53.3 mmHg (ref 44.0–60.0)
pH, Ven: 6.964 — CL (ref 7.250–7.430)
pO2, Ven: 53 mmHg — ABNORMAL HIGH (ref 32.0–45.0)

## 2019-02-22 LAB — I-STAT CHEM 8, ED
BUN: 18 mg/dL (ref 6–20)
Calcium, Ion: 0.99 mmol/L — ABNORMAL LOW (ref 1.15–1.40)
Chloride: 107 mmol/L (ref 98–111)
Creatinine, Ser: 0.5 mg/dL (ref 0.44–1.00)
Glucose, Bld: 366 mg/dL — ABNORMAL HIGH (ref 70–99)
HCT: 46 % (ref 36.0–46.0)
Hemoglobin: 15.6 g/dL — ABNORMAL HIGH (ref 12.0–15.0)
Potassium: 4.5 mmol/L (ref 3.5–5.1)
Sodium: 137 mmol/L (ref 135–145)
TCO2: 13 mmol/L — ABNORMAL LOW (ref 22–32)

## 2019-02-22 MED ORDER — EPINEPHRINE 1 MG/10ML IJ SOSY
PREFILLED_SYRINGE | INTRAMUSCULAR | Status: AC | PRN
  Administered 2019-02-22 (×3): 1 mg via INTRAVENOUS

## 2019-02-22 MED ORDER — SODIUM BICARBONATE 8.4 % IV SOLN
INTRAVENOUS | Status: AC | PRN
  Administered 2019-02-22: 25 meq via INTRAVENOUS

## 2019-02-26 ENCOUNTER — Ambulatory Visit (HOSPITAL_COMMUNITY): Admit: 2019-02-26 | Payer: BC Managed Care – PPO | Admitting: Gastroenterology

## 2019-02-26 SURGERY — ESOPHAGOGASTRODUODENOSCOPY (EGD) WITH PROPOFOL
Anesthesia: Monitor Anesthesia Care

## 2019-02-28 ENCOUNTER — Other Ambulatory Visit (INDEPENDENT_AMBULATORY_CARE_PROVIDER_SITE_OTHER): Payer: Self-pay | Admitting: Critical Care Medicine

## 2019-02-28 DIAGNOSIS — I10 Essential (primary) hypertension: Secondary | ICD-10-CM

## 2019-03-17 NOTE — Progress Notes (Signed)
   Feb 28, 2019 1933  Clinical Encounter Type  Visited With Family;Health care provider  Visit Type Initial;ED;Death  Referral From Nurse  Spiritual Encounters  Spiritual Needs Emotional;Grief support  Stress Factors  Family Stress Factors Loss;Major life changes   Chaplain responded to patient's death. Chaplain helped with medical consultation from ED physicians, and helped transition patient's family to the patient's room. Chaplain extended hospitality. Chaplain offered support, and relayed patient's next of kin information to RN (daughter Kyelle Brushaber [phone]; [address], [place] [address]). Spiritual care services available as needed.   Jeri Lager, Chaplain

## 2019-03-17 NOTE — Code Documentation (Signed)
Pulse check: no pulse, remains in asystole, CPR continued

## 2019-03-17 NOTE — Code Documentation (Signed)
Intubated, 23 at the teeth, 7.5 tube

## 2019-03-17 NOTE — ED Provider Notes (Addendum)
[place] EMERGENCY DEPARTMENT Provider Note   CSN: [id number] Arrival date & time: 24-Mar-2019  1731     History   Chief Complaint No chief complaint on file.   HPI Melissa Valdez is a 53 y.o. female.     7 epinephrine doses as well as 2 doses of amiodarone, bicarb, and magnesium were administered by EMS prior to arrival.  5 attempts were made to defibrillation prior to arrival in the emergency department.  After the fifth attempted defibrillation patient's rhythm changed to asystole therefore no more prehospital attempts were made at defibrillation.  After speaking to the patient's daughter it seemed as though she is in her usual overall poor state of health with chronically poorly managed diabetes when she had loss of consciousness about 45 minutes ago which was witnessed by family members.  She never regained consciousness.   Cardiac Arrest Witnessed by:  Family member Incident location:  Home Time since incident:  45 minutes Time before BLS initiated:  > 5 minutes (about 5 minutes for BLS team arrival) Time before ALS initiated:  > 10 minutes (about 10-15 minutes for ALS team arrival) Condition upon EMS arrival:  Unresponsive Pulse:  Absent Initial cardiac rhythm per EMS:  Ventricular fibrillation Treatments prior to arrival:  ACLS protocol Medications given prior to ED:  Magnesium, amiodarone, epinephrine and sodium bicarbonate Airway: supraglottic device. Rhythm on admission to ED:  Asystole Associated symptoms: dizziness and loss of consciousness   Associated symptoms: no chest pain, no shortness of breath and no vomiting   Risk factors: diabetes mellitus   Risk factors: no drug overdose, no head injury and no trauma     Past Medical History:  Diagnosis Date  . Diabetes mellitus without complication ([place])   . GERD (gastroesophageal reflux disease)   . Hypertension   . Obesity, Class III, BMI 40-49.9 (morbid obesity) ([place])   . Sleep apnea    cpap-      Patient Active Problem List   Diagnosis Date Noted  . Menopausal syndrome (hot flashes) 05/22/2018  . Chronic pain of both knees 05/22/2018  . Goiter 05/09/2017  . Diabetes mellitus type II, controlled, with no complications ([place]) 123456  . Hypertension 10/28/2014  . Morbid obesity with BMI of 40.0-44.9, adult ([place]) 06/28/2014  . Dysmenorrhea 01/20/2014  . Abnormal uterine bleeding ([place]) 01/20/2014  . Fibroids, intramural 01/19/2014    Past Surgical History:  Procedure Laterality Date  . boil on leg removed   1980s  . TUBAL LIGATION  1999     OB History    Gravida  2   Para  2   Term  2   Preterm      AB      Living  2     SAB      TAB      Ectopic      Multiple      Live Births  2            Home Medications    Prior to Admission medications   Medication Sig Start Date End Date Taking? Authorizing Provider  ACCU-CHEK AVIVA PLUS test strip Use three times per day 11/21/18   Charlott Rakes, MD  amLODipine (NORVASC) 10 MG tablet TAKE 1 TABLET BY MOUTH DAILY 12/03/18   Charlott Rakes, MD  artificial tears (LACRILUBE) OINT ophthalmic ointment Place into the left eye daily as needed for dry eyes. 12/03/18   Loura Halt A, NP  atorvastatin (LIPITOR) 40 MG tablet  Take 1 tablet (40 mg total) by mouth daily. 10/31/18   Charlott Rakes, MD  Blood Glucose Monitoring Suppl (ACCU-CHEK AVIVA) device Use as instructed 05/22/18 05/22/19  Elsie Stain, MD  Dulaglutide (TRULICITY) A999333 0000000 SOPN Inject 0.75 mg into the skin once a week. 11/21/18   Charlott Rakes, MD  glimepiride (AMARYL) 2 MG tablet TAKE 1 TABLET BY MOUTH DAILY BEFORE BREAKFAST 05/22/18   Elsie Stain, MD  hydrochlorothiazide (HYDRODIURIL) 25 MG tablet TAKE 1 TABLET BY MOUTH DAILY IN THE MORNING 01/26/19   Kerin Perna, NP  ibuprofen (ADVIL) 800 MG tablet TAKE 1 TABLET BY MOUTH TWICE DAILY AS NEEDED FOR PAIN 01/28/19   Magnant, Gerrianne Scale, PA-C  Lancets (ACCU-CHEK SOFT TOUCH) lancets  Use as instructed 05/22/18   Elsie Stain, MD  lisinopril (ZESTRIL) 10 MG tablet Take 1 tablet (10 mg total) by mouth daily. 02/06/19   Kerin Perna, NP  metFORMIN (GLUCOPHAGE) 1000 MG tablet Take 1 tablet (1,000 mg total) by mouth 2 (two) times daily with a meal. Stop Janumet. Patient not taking: Reported on 02/19/2019 11/21/18   Charlott Rakes, MD  olopatadine (PATANOL) 0.1 % ophthalmic solution INT 1 GTT INTO OU BID 01/19/19   [provider]  PARoxetine (PAXIL) 10 MG tablet Take 1 tablet (10 mg total) by mouth daily. 05/22/18   Elsie Stain, MD  potassium chloride SA (K-DUR,KLOR-CON) 20 MEQ tablet Take 1 tablet (20 mEq total) by mouth daily. 05/22/18   Elsie Stain, MD  SitaGLIPtin-MetFORMIN HCl (JANUMET XR) 50-1000 MG TB24 Take 1 tablet by mouth 2 (two) times daily.    [provider]    Family History Family History  Problem Relation Age of Onset  . Hypertension Mother   . Diabetes Father   . Goiter Sister   . Stroke Other     Social History Social History   Tobacco Use  . Smoking status: Current Every Day Smoker    Packs/day: 0.25    Types: Cigarettes  . Smokeless tobacco: Never Used  Substance Use Topics  . Alcohol use: Yes    Alcohol/week: 3.0 standard drinks    Types: 2 Cans of beer, 1 Shots of liquor per week  . Drug use: No     Allergies   Patient has no known allergies.   Review of Systems Review of Systems  Unable to perform ROS: Acuity of condition  Respiratory: Negative for shortness of breath.   Cardiovascular: Negative for chest pain.  Gastrointestinal: Negative for vomiting.  Neurological: Positive for dizziness and loss of consciousness.     Physical Exam Updated Vital Signs Resp 12   Ht 5' (1.524 m)   Wt 97.7 kg   SpO2 (!) 79%   BMI 42.07 kg/m   Physical Exam Constitutional:      General: She is in acute distress.     Comments: Patient arrived with CPR in progress.  GCS of 3  HENT:     Head: Atraumatic.   Eyes:     Comments: Pupils 3 mm and fixed bilaterally  Neck:     Musculoskeletal: No neck rigidity.  Cardiovascular:     Comments: CPR in progress with [facility] device Pulmonary:     Comments: No spontaneous respirations.  Supraglottic device in place at the time of my initial evaluation. Abdominal:     General: There is distension.  Neurological:     Comments: GCS 3 T.  Unresponsive to painful stimuli.  No purposeful movements noted  in any extremity.      ED Treatments / Results  Labs (all labs ordered are listed, but only abnormal results are displayed) Labs Reviewed  I-STAT CHEM 8, ED - Abnormal; Notable for the following components:      Result Value   Glucose, Bld 366 (*)    Calcium, Ion 0.99 (*)    TCO2 13 (*)    Hemoglobin 15.6 (*)    All other components within normal limits  POCT I-STAT EG7 - Abnormal; Notable for the following components:   pH, Ven 6.964 (*)    pO2, Ven 53.0 (*)    Bicarbonate 12.1 (*)    TCO2 14 (*)    Acid-base deficit 20.0 (*)    Calcium, Ion 0.99 (*)    All other components within normal limits    EKG None  Radiology No results found.  Procedures Procedure Name: Intubation Date/Time: 02/23/2019 12:11 AM Performed by: Romona Curls, MD Pre-anesthesia Checklist: Patient identified, Suction available, Patient being monitored and Emergency Drugs available Oxygen Delivery Method: Ambu bag Preoxygenation: Pre-oxygenation with 100% oxygen Induction Type: Cricoid Pressure applied Ventilation: Mask ventilation with difficulty Laryngoscope Size: Glidescope and 3 Grade View: Grade III Tube size: 7.5 mm Number of attempts: 1 Airway Equipment and Method: Rigid stylet and Video-laryngoscopy Placement Confirmation: ETT inserted through vocal cords under direct vision,  Positive ETCO2,  CO2 detector and Breath sounds checked- equal and bilateral Secured at: 23 cm Tube secured with: ETT holder Dental Injury: Teeth and Oropharynx as per  pre-operative assessment  Difficulty Due To: Difficult Airway- due to anterior larynx      (including critical care time)  Medications Ordered in ED Medications  EPINEPHrine (ADRENALIN) 1 MG/10ML injection (1 mg Intravenous Given 03/05/19 1743)  sodium bicarbonate injection (25 mEq Intravenous Given Mar 05, 2019 1744)     Initial Impression / Assessment and Plan / ED Course  I have reviewed the triage vital signs and the nursing notes.  Pertinent labs & imaging results that were available during my care of the patient were reviewed by me and considered in my medical decision making (see chart for details).        Patient is a 53 year old female with history and physical exam as above who presents to the emergency department as CPR in progress initiated by EMS prior to arrival.  Per report from EMS patient had a witnessed loss of consciousness episode by family members.  911 was called and CPR was initiated by fire department approximately 5 minutes after patient's loss of consciousness.  At paramedic arrival patient's rhythm was found to be ventricular fibrillation.  Code was run in the field and a total of 7 doses of epinephrine, magnesium, and 2 doses of amiodarone and 5 attempts were made at defibrillation prior to arrival in the emergency department.  At EMS arrival [place] device was in place performing CPR.  Patient was difficult to bag the BVM and supraglottic device therefore supraglottic device was replaced with a 7.5 endotracheal tube as documented in procedure note above.  Patient was still somewhat difficult to bag after placement of the endotracheal tube however it was somewhat easier.  At the first pulse check patient did not have a pulse and the rhythm that was seen was asystole versus fine V. fib therefore decision was made to proceed with defibrillation.  There is no response to defibrillation.  CPR was continued with a total of 3 doses of epinephrine given in the emergency  department as well as an  amp of bicarb without improvement.  Time of death was called at 1748.  I notified the patient's daughter.  I discussed the patient with medical examiner who agrees that cause of death is likely natural therefore no need for ME case at this time.  Final Clinical Impressions(s) / ED Diagnoses   Final diagnoses:  Cardiac arrest [facility])    ED Discharge Orders    None       Romona Curls, MD 03-14-2019 [id number]    Romona Curls, MD 02/23/19 [id number]    Gareth Morgan, MD 02/24/19 1235    Gareth Morgan, MD 02/24/19 1237

## 2019-03-17 NOTE — Code Documentation (Signed)
Lung sounds clear, pupils 75mm equal, non reactive

## 2019-03-17 NOTE — Code Documentation (Signed)
Pulse check: no pulse, asystole on the monitor, resident at bedside with [place] to check for cardiac activity. No cardiac activity noted.

## 2019-03-17 NOTE — Code Documentation (Signed)
Pulse check, no pulse. Fine v fib vs asystole. Shocked delivered at 120J at [employer]

## 2019-03-17 NOTE — Code Documentation (Signed)
Time of death: 19

## 2019-03-17 NOTE — ED Triage Notes (Signed)
Pt arrives with EMS, CPR in progress. Family witnessed cardiac arrest, fire got on scene within 5 mins and started CPR at 1634, v fib noted on the monitor. Pt given 7 epis, 450 mg amiodarone and 2mg  magnesium en route. Pt went from v fib to asystole en route, remains in asystole upon arrival to ED. Capnography at 33 en route. IO R tibia.

## 2019-03-17 DEATH — deceased

## 2019-04-01 ENCOUNTER — Ambulatory Visit: Payer: BC Managed Care – PPO

## 2019-05-03 IMAGING — CR DG CHEST 2V
2 series · 2 of 2 positions shown · non-contrast
Comparison: 10/28/2014 chest radiographs and earlier.

CLINICAL DATA: 52-year-old female with cough, lightheadedness,
chills, hypertensive.

EXAM:
CHEST - 2 VIEW

[chest lat]
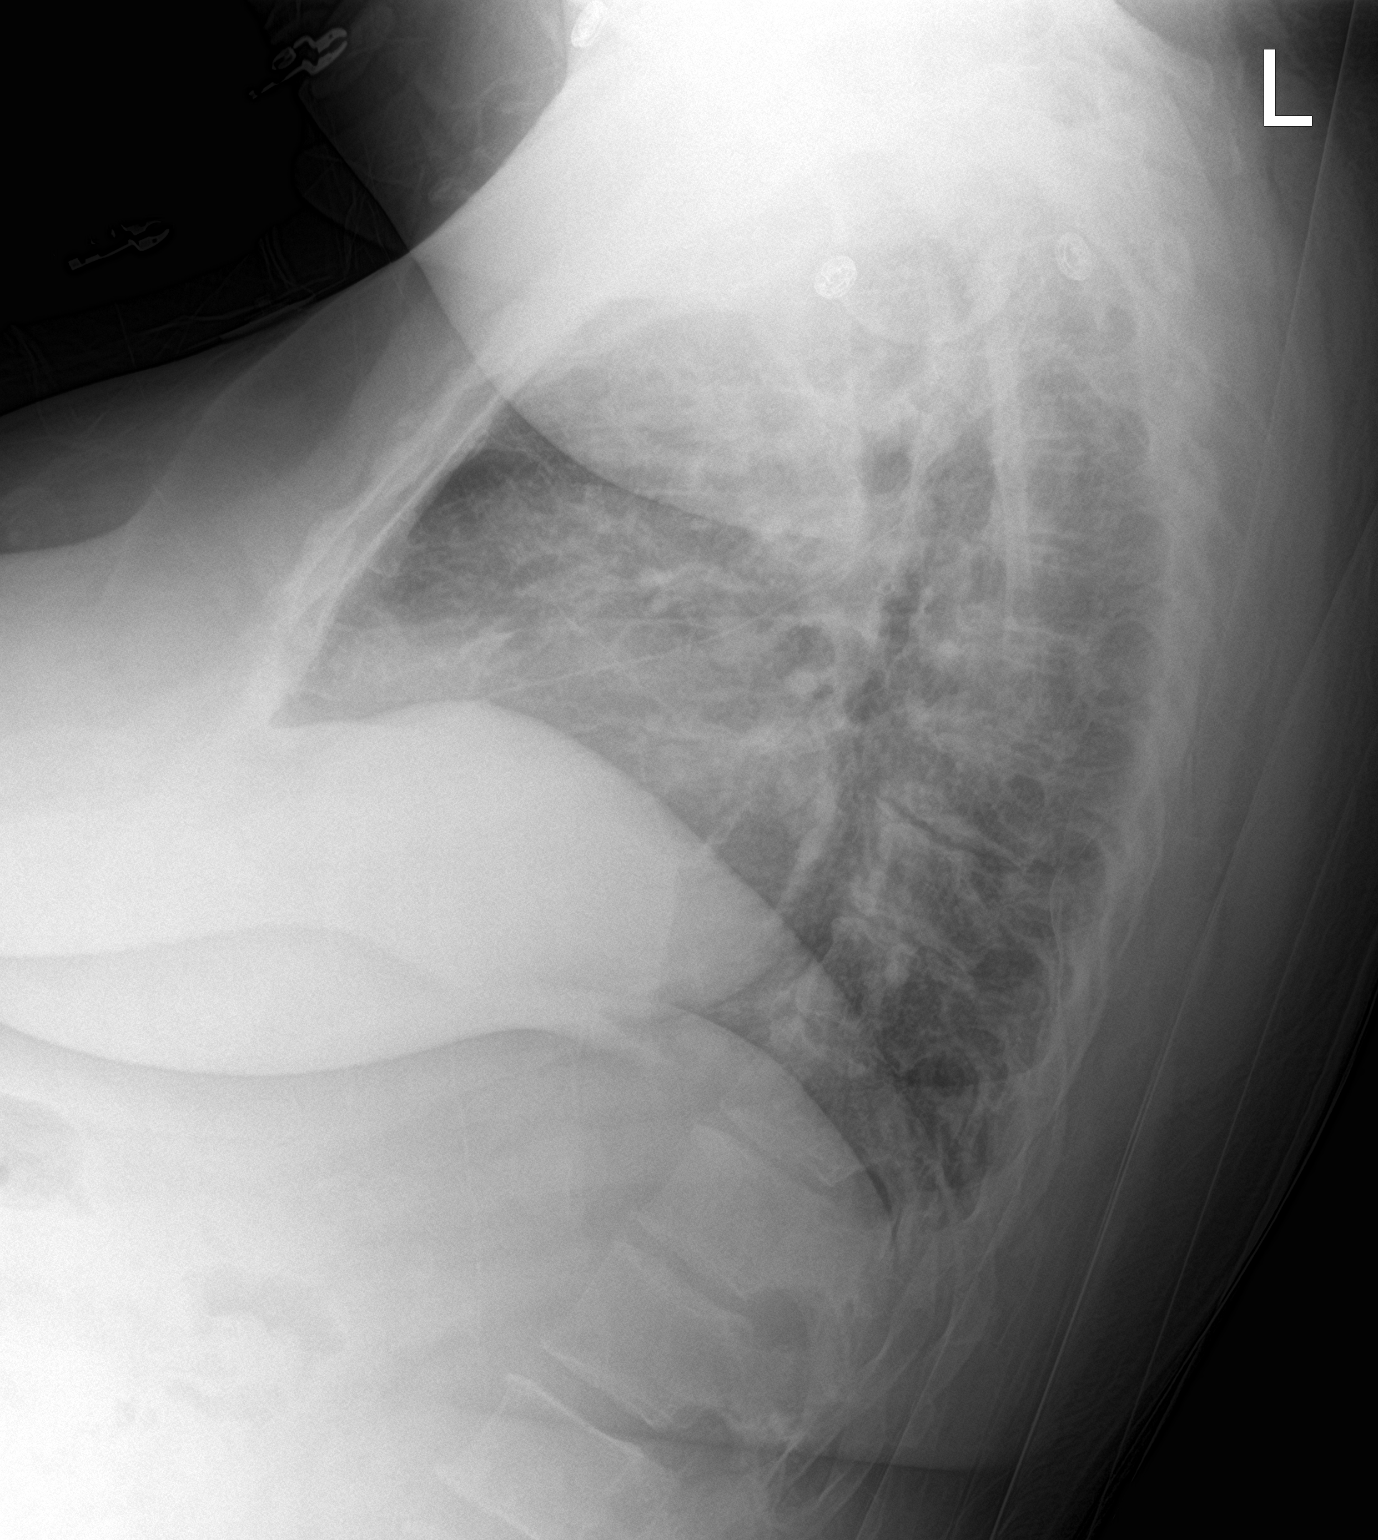

[chest ap]
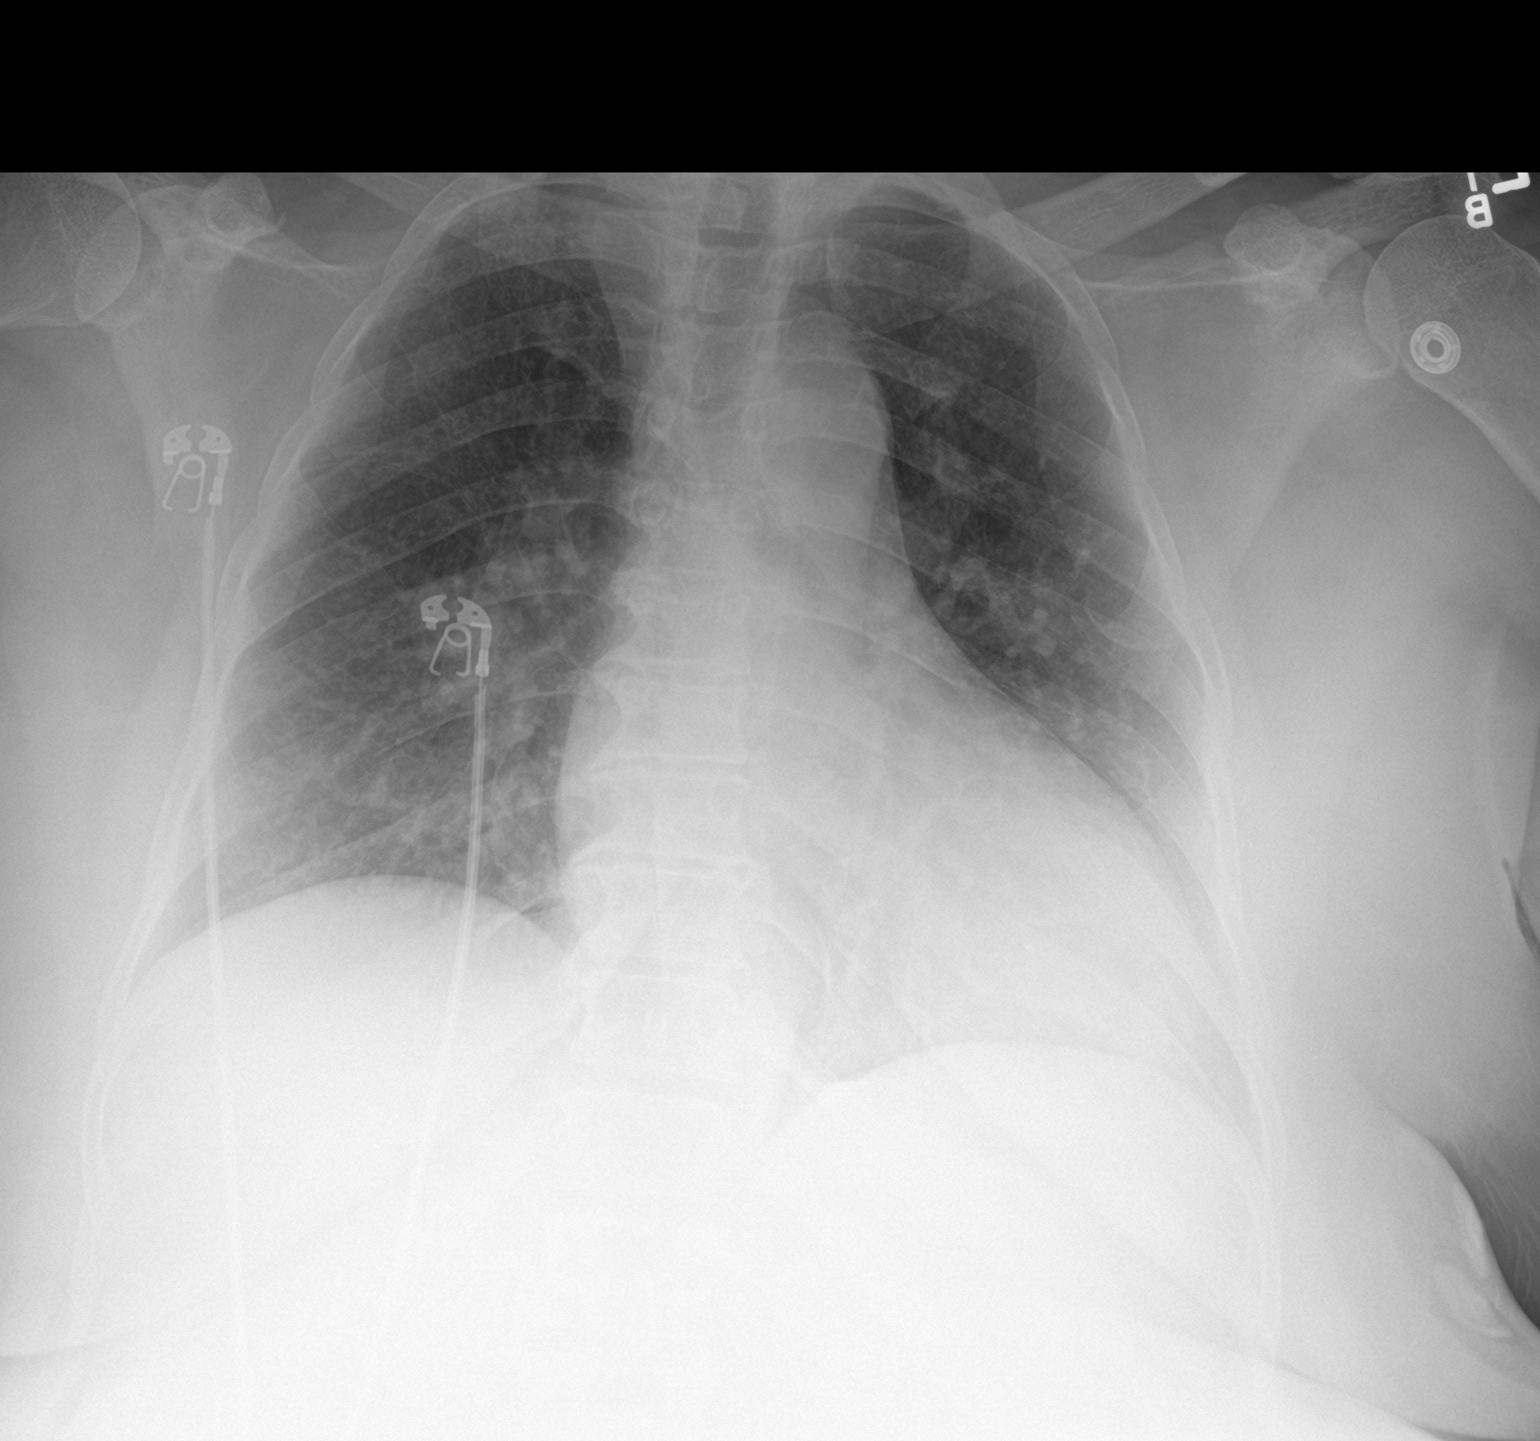

[2 of 2 positions shown; findings below may reference images not displayed]

FINDINGS: AP and lateral views of the chest. Lower lung volumes. Mild
cardiomegaly which is new or increased since 9171. Other mediastinal
contours are within normal limits. Visualized tracheal air column is
within normal limits. Crowding of lung markings and vascular
congestion without overt edema. No pneumothorax, pleural effusion or
confluent pulmonary opacity.

No acute osseous abnormality identified. Negative visible bowel gas
pattern.
IMPRESSION: Mild cardiomegaly appears new or increased since 9171.

Lower lung volumes with crowding and vascular congestion but no
overt pulmonary edema.

## 2019-05-18 ENCOUNTER — Ambulatory Visit: Payer: BC Managed Care – PPO | Admitting: Endocrinology
